# Patient Record
Sex: Female | Born: 1995 | Race: Black or African American | Hispanic: No | Marital: Married | Smoking: Never smoker
Health system: Southern US, Community
[De-identification: ages and names within clinical notes are randomized; demographics above are authoritative.]

## PROBLEM LIST (undated history)

## (undated) DIAGNOSIS — J45909 Unspecified asthma, uncomplicated: Secondary | ICD-10-CM

## (undated) DIAGNOSIS — E039 Hypothyroidism, unspecified: Secondary | ICD-10-CM

---

## 2019-09-30 ENCOUNTER — Encounter (HOSPITAL_BASED_OUTPATIENT_CLINIC_OR_DEPARTMENT_OTHER): Payer: Self-pay | Admitting: *Deleted

## 2019-09-30 ENCOUNTER — Other Ambulatory Visit: Payer: Self-pay

## 2019-09-30 ENCOUNTER — Emergency Department (HOSPITAL_BASED_OUTPATIENT_CLINIC_OR_DEPARTMENT_OTHER): Payer: BC Managed Care – PPO

## 2019-09-30 ENCOUNTER — Emergency Department (HOSPITAL_BASED_OUTPATIENT_CLINIC_OR_DEPARTMENT_OTHER)
Admission: EM | Admit: 2019-09-30 | Discharge: 2019-09-30 | Disposition: A | Discharge status: Home / Self Care | Payer: BC Managed Care – PPO | Attending: Emergency Medicine | Admitting: Emergency Medicine

## 2019-09-30 DIAGNOSIS — R000 Tachycardia, unspecified: Secondary | ICD-10-CM | POA: Insufficient documentation

## 2019-09-30 DIAGNOSIS — U071 COVID-19: Secondary | ICD-10-CM | POA: Diagnosis not present

## 2019-09-30 DIAGNOSIS — R5383 Other fatigue: Secondary | ICD-10-CM | POA: Diagnosis not present

## 2019-09-30 DIAGNOSIS — J1282 Pneumonia due to coronavirus disease 2019: Secondary | ICD-10-CM | POA: Insufficient documentation

## 2019-09-30 DIAGNOSIS — J3489 Other specified disorders of nose and nasal sinuses: Secondary | ICD-10-CM | POA: Insufficient documentation

## 2019-09-30 DIAGNOSIS — R05 Cough: Secondary | ICD-10-CM | POA: Diagnosis present

## 2019-09-30 LAB — CBC WITH DIFFERENTIAL/PLATELET
Abs Immature Granulocytes: 0.02 10*3/uL (ref 0.00–0.07)
Basophils Absolute: 0 10*3/uL (ref 0.0–0.1)
Basophils Relative: 0 %
Eosinophils Absolute: 0 10*3/uL (ref 0.0–0.5)
Eosinophils Relative: 0 %
HCT: 40.2 % (ref 36.0–46.0)
Hemoglobin: 13.5 g/dL (ref 12.0–15.0)
Immature Granulocytes: 0 %
Lymphocytes Relative: 15 %
Lymphs Abs: 0.9 10*3/uL (ref 0.7–4.0)
MCH: 28.4 pg (ref 26.0–34.0)
MCHC: 33.6 g/dL (ref 30.0–36.0)
MCV: 84.5 fL (ref 80.0–100.0)
Monocytes Absolute: 0.5 10*3/uL (ref 0.1–1.0)
Monocytes Relative: 8 %
Neutro Abs: 4.4 10*3/uL (ref 1.7–7.7)
Neutrophils Relative %: 77 %
Platelets: 202 10*3/uL (ref 150–400)
RBC: 4.76 MIL/uL (ref 3.87–5.11)
RDW: 14.4 % (ref 11.5–15.5)
WBC: 5.7 10*3/uL (ref 4.0–10.5)
nRBC: 0 % (ref 0.0–0.2)

## 2019-09-30 LAB — BASIC METABOLIC PANEL
Anion gap: 12 (ref 5–15)
BUN: 10 mg/dL (ref 6–20)
CO2: 24 mmol/L (ref 22–32)
Calcium: 8.3 mg/dL — ABNORMAL LOW (ref 8.9–10.3)
Chloride: 102 mmol/L (ref 98–111)
Creatinine, Ser: 1.28 mg/dL — ABNORMAL HIGH (ref 0.44–1.00)
GFR calc Af Amer: >60 mL/min (ref >60)
GFR calc non Af Amer: 59 mL/min — ABNORMAL LOW (ref >60)
Glucose, Bld: 99 mg/dL (ref 70–99)
Potassium: 3.2 mmol/L — ABNORMAL LOW (ref 3.5–5.1)
Sodium: 138 mmol/L (ref 135–145)

## 2019-09-30 LAB — SARS CORONAVIRUS 2 BY RT PCR (HOSPITAL ORDER, PERFORMED IN ~~LOC~~ HOSPITAL LAB): SARS Coronavirus 2: POSITIVE — AB

## 2019-09-30 LAB — HCG, QUANTITATIVE, PREGNANCY: hCG, Beta Chain, Quant, S: <1 m[IU]/mL (ref <5)

## 2019-09-30 LAB — TROPONIN I (HIGH SENSITIVITY): Troponin I (High Sensitivity): 4 ng/L (ref <18)

## 2019-09-30 LAB — PREGNANCY, URINE: Preg Test, Ur: NEGATIVE

## 2019-09-30 LAB — BRAIN NATRIURETIC PEPTIDE: B Natriuretic Peptide: 18.2 pg/mL (ref 0.0–100.0)

## 2019-09-30 MED ORDER — FLUTICASONE PROPIONATE 50 MCG/ACT NA SUSP: 2.0000 | Freq: Every day | NASAL | 0 refills | Status: DC

## 2019-09-30 MED ORDER — ALBUTEROL SULFATE HFA 108 (90 BASE) MCG/ACT IN AERS: 1.0000 | INHALATION_SPRAY | Freq: Four times a day (QID) | RESPIRATORY_TRACT | 0 refills | Status: DC | PRN

## 2019-09-30 MED ORDER — KETOROLAC TROMETHAMINE 30 MG/ML IJ SOLN
30.0000 mg | Freq: Once | INTRAMUSCULAR | Status: AC
  Administered 2019-09-30: 30 mg via INTRAVENOUS
  Filled 2019-09-30: qty 1

## 2019-09-30 MED ORDER — DEXAMETHASONE SODIUM PHOSPHATE 10 MG/ML IJ SOLN
10.0000 mg | Freq: Once | INTRAMUSCULAR | Status: AC
  Administered 2019-09-30: 10 mg via INTRAVENOUS
  Filled 2019-09-30: qty 1

## 2019-09-30 MED ORDER — IOHEXOL 350 MG/ML SOLN
100.0000 mL | Freq: Once | INTRAVENOUS | Status: AC | PRN
  Administered 2019-09-30: 100 mL via INTRAVENOUS

## 2019-09-30 MED ORDER — DEXAMETHASONE 6 MG PO TABS: 6.0000 mg | ORAL_TABLET | Freq: Every day | ORAL | 0 refills | Status: AC

## 2019-09-30 MED ORDER — SODIUM CHLORIDE 0.9 % IV BOLUS
500.0000 mL | Freq: Once | INTRAVENOUS | Status: AC
  Administered 2019-09-30: 500 mL via INTRAVENOUS

## 2019-09-30 MED ORDER — ACETAMINOPHEN 500 MG PO TABS
1000.0000 mg | ORAL_TABLET | Freq: Once | ORAL | Status: AC
  Administered 2019-09-30: 1000 mg via ORAL
  Filled 2019-09-30: qty 2

## 2019-09-30 MED ORDER — ONDANSETRON 4 MG PO TBDP: 4.0000 mg | ORAL_TABLET | Freq: Three times a day (TID) | ORAL | 0 refills | Status: DC | PRN

## 2019-09-30 MED ORDER — BENZONATATE 100 MG PO CAPS: 100.0000 mg | ORAL_CAPSULE | Freq: Three times a day (TID) | ORAL | 0 refills | Status: DC

## 2019-09-30 NOTE — Discharge Instructions (Addendum)
Take the tessalon pearls for cough  Albuterol for SOB or cough  Zofran for nausea or vomitting  Flonase for nasal congestion  Decadron for inflammation  Return for new or worsening symptoms such as CP, SOB, unilateral leg swelling, redness, warmth, coughing up blood, unable to keep down liquids  You need to self isolate at home for 10 days. I have written you a work/ school note. I would suggest people in your household also get tested for COVID and self isolate

## 2019-09-30 NOTE — ED Notes (Signed)
Commode placed in room.

## 2019-09-30 NOTE — ED Triage Notes (Signed)
Dry cough, sore throat, fever, chills, fatigue, headache, body aches x 3 days.

## 2019-09-30 NOTE — ED Provider Notes (Addendum)
[facility]PART[facility] Provider Note   CSN: [id number] Arrival date & time: 09/30/19  1647    History Chief Complaint  Patient presents with  . Cough    Sarah Calhoun is a 24 y.o. female with no significant past medical history who presents for evaluation of upper respiratory complaints.  Patient with nonproductive cough, sore throat, fever, fatigue, myalgias, congestion, rhinorrhea and headache x3 days.  She did not receive COVID vaccine.  Significant other with similar symptoms.  She has not been tested for Covid.  No sudden onset thunderclap headache.  No associated neck pain, neck stiffness.  She has been able to tolerate p.o. intake at home however has had decreased p.o. appetite.  She has not taken anything for her symptoms.  She denies any chest pain, shortness of breath, abdominal pain, diarrhea, dysuria, lateral leg swelling, redness or warmth.  Denies any chance of pregnancy.  Denies additional aggravating or relieving factors.  History obtained from patient and past medical records.  No interpreter used.  HPI     History reviewed. No pertinent past medical history.  There are no problems to display for this patient.   History reviewed. No pertinent surgical history.   OB History   No obstetric history on file.     No family history on file.  Social History   Tobacco Use  . Smoking status: Never Smoker  . Smokeless tobacco: Never Used  Substance Use Topics  . Alcohol use: Yes  . Drug use: Never    Home Medications Prior to Admission medications   Medication Sig Start Date End Date Taking? Authorizing Provider  albuterol (VENTOLIN HFA) 108 (90 Base) MCG/ACT inhaler Inhale 1-2 puffs into the lungs every 6 (six) hours as needed for wheezing or shortness of breath. 09/30/19   Khyren Hing A, PA-C  benzonatate (TESSALON) 100 MG capsule Take 1 capsule (100 mg total) by mouth every 8 (eight) hours. 09/30/19   Allyiah Gartner A, PA-C    dexamethasone (DECADRON) 6 MG tablet Take 1 tablet (6 mg total) by mouth daily for 5 days. 09/30/19 10/05/19  Loukisha Gunnerson A, PA-C  fluticasone (FLONASE) 50 MCG/ACT nasal spray Place 2 sprays into both nostrils daily. 09/30/19   Niyanna Asch A, PA-C  ondansetron (ZOFRAN ODT) 4 MG disintegrating tablet Take 1 tablet (4 mg total) by mouth every 8 (eight) hours as needed for nausea or vomiting. 09/30/19   Daquann Merriott A, PA-C    Allergies    Patient has no known allergies.  Review of Systems   Review of Systems  Constitutional: Positive for activity change, appetite change, chills, fatigue and fever.  HENT: Positive for congestion, postnasal drip, rhinorrhea and sore throat. Negative for drooling, ear discharge, sinus pressure, sinus pain, sneezing, trouble swallowing and voice change.   Respiratory: Positive for cough. Negative for apnea, choking, chest tightness, shortness of breath, wheezing and stridor.   Cardiovascular: Positive for chest pain (When coughing, no pain at rest).  Gastrointestinal: Negative.   Genitourinary: Negative.   Musculoskeletal: Negative.   Skin: Negative.   Neurological: Positive for weakness (Generalized) and headaches. Negative for dizziness, tremors, seizures, syncope, facial asymmetry, speech difficulty, light-headedness and numbness.  All other systems reviewed and are negative.   Physical Exam Updated Vital Signs BP 123/83 (BP Location: Left Arm)   Pulse 100   Temp 98.5 F (36.9 C) (Oral)   Resp 16   Ht 5\' 5"  (1.651 m)   Wt 133.8 kg   SpO2 93%  BMI 49.09 kg/m   Physical Exam Vitals and nursing note reviewed.  Constitutional:      General: She is not in acute distress.    Appearance: She is not ill-appearing, toxic-appearing or diaphoretic.  HENT:     Head: Normocephalic and atraumatic.     Jaw: There is normal jaw occlusion.     Right Ear: Tympanic membrane, ear canal and external ear normal. There is no impacted cerumen. No  hemotympanum. Tympanic membrane is not injected, scarred, perforated, erythematous, retracted or bulging.     Left Ear: Tympanic membrane, ear canal and external ear normal. There is no impacted cerumen. No hemotympanum. Tympanic membrane is not injected, scarred, perforated, erythematous, retracted or bulging.     Ears:     Comments: No Mastoid tenderness.    Nose:     Comments: Clear rhinorrhea and congestion to bilateral nares.  No sinus tenderness.    Mouth/Throat:     Comments: Posterior oropharynx clear.  Mucous membranes moist.  Tonsils without erythema or exudate.  Uvula midline without deviation.  No evidence of PTA or RPA.  No drooling, dysphasia or trismus.  Phonation normal. Neck:     Trachea: Trachea normal.     Meningeal: Brudzinski's sign and Kernig's sign absent.     Comments: No Neck stiffness or neck rigidity.  No meningismus.  No cervical lymphadenopathy. Hoarse voice Cardiovascular:     Rate and Rhythm: Tachycardia present.     Pulses: Normal pulses.          Radial pulses are 2+ on the right side and 2+ on the left side.       Dorsalis pedis pulses are 2+ on the right side and 2+ on the left side.     Heart sounds: Normal heart sounds.     Comments: No murmurs rubs or gallops. Pulmonary:     Effort: Pulmonary effort is normal. No tachypnea, accessory muscle usage or respiratory distress.     Breath sounds: Normal breath sounds.     Comments: Clear to auscultation bilaterally without wheeze, rhonchi or rales.  No accessory muscle usage.  Able speak in full sentences. Abdominal:     Comments: Soft, nontender without rebound or guarding.  No CVA tenderness.  Musculoskeletal:     Cervical back: Full passive range of motion without pain and normal range of motion.     Comments: Moves all 4 extremities without difficulty.  Lower extremities without edema, erythema or warmth.  Skin:    Comments: Brisk capillary refill.  No rashes or lesions.  Neurological:     Mental  Status: She is alert.     Comments: Ambulatory in department without difficulty.  Cranial nerves II through XII grossly intact.  No facial droop.  No aphasia.    ED Results / Procedures / Treatments   Labs (all labs ordered are listed, but only abnormal results are displayed) Labs Reviewed  SARS CORONAVIRUS 2 BY RT PCR ([facility]) - Abnormal; Notable for the following components:      Result Value   SARS Coronavirus 2 POSITIVE (*)    All other components within normal limits  BASIC METABOLIC PANEL - Abnormal; Notable for the following components:   Potassium 3.2 (*)    Creatinine, Ser 1.28 (*)    Calcium 8.3 (*)    GFR calc non Af Amer 59 (*)    All other components within normal limits  PREGNANCY, URINE  CBC WITH DIFFERENTIAL/PLATELET  BRAIN NATRIURETIC PEPTIDE  HCG, QUANTITATIVE, PREGNANCY  TROPONIN I (HIGH SENSITIVITY)  TROPONIN I (HIGH SENSITIVITY)    EKG EKG Interpretation  Date/Time:  Thursday September 30 2019 20:18:28 EDT Ventricular Rate:  102 PR Interval:    QRS Duration: 105 QT Interval:  356 QTC Calculation: 464 R Axis:   84 Text Interpretation: Sinus tachycardia Probable left atrial enlargement Nonspecific T abnormalities, anterior leads No STEMI Confirmed by Nanda Quinton [phone]) on 09/30/2019 9:11:13 PM   Radiology CT Angio Chest PE W and/or Wo Contrast  Result Date: 09/30/2019 CLINICAL DATA:  Dry cough, sore throat, fever, chills, COVID-19 positive EXAM: CT ANGIOGRAPHY CHEST WITH CONTRAST TECHNIQUE: Multidetector CT imaging of the chest was performed using the standard protocol during bolus administration of intravenous contrast. Multiplanar CT image reconstructions and MIPs were obtained to evaluate the vascular anatomy. CONTRAST:  180mL OMNIPAQUE IOHEXOL 350 MG/ML SOLN COMPARISON:  09/30/2019 FINDINGS: Cardiovascular: This is a technically adequate evaluation of the pulmonary vasculature. There are no filling defects  or pulmonary emboli. The heart is unremarkable with no significant pericardial effusion. Thoracic aorta is unremarkable. Mediastinum/Nodes: Thyroid, trachea, and esophagus are unremarkable. Small lymph nodes are seen in the bilateral hilar regions, likely reactive. No pathologic adenopathy. Lungs/Pleura: Nodular airspace disease is seen bilaterally throughout the lungs, most consistent with multifocal pneumonia. No effusion or pneumothorax. Central airways are patent. Upper Abdomen: No acute abnormality. Musculoskeletal: No acute or destructive bony lesions. Reconstructed images demonstrate no additional findings. Review of the MIP images confirms the above findings. IMPRESSION: 1. No evidence of pulmonary embolus. 2. Multifocal bilateral nodular airspace disease, compatible with COVID-19 pneumonia. Electronically Signed   By: Randa Ngo M.D.   On: 09/30/2019 21:05   DG Chest Portable 1 View  Result Date: 09/30/2019 CLINICAL DATA:  COVID.  Headache. EXAM: PORTABLE CHEST 1 VIEW COMPARISON:  None. FINDINGS: Normal cardiac silhouette. Central venous pulmonary congestion. No focal infiltrate. No pleural fluid. No pneumothorax. IMPRESSION: Central venous congestion.  No viral pneumonia identified. Electronically Signed   By: Suzy Bouchard M.D.   On: 09/30/2019 19:26    Procedures Procedures (including critical care time)  Medications Ordered in ED Medications  acetaminophen (TYLENOL) tablet 1,000 mg (1,000 mg Oral Given 09/30/19 1703)  sodium chloride 0.9 % bolus 500 mL (500 mLs Intravenous New Bag/Given 09/30/19 2008)  iohexol (OMNIPAQUE) 350 MG/ML injection 100 mL (100 mLs Intravenous Contrast Given 09/30/19 2054)  ketorolac (TORADOL) 30 MG/ML injection 30 mg (30 mg Intravenous Given 09/30/19 2124)  dexamethasone (DECADRON) injection 10 mg (10 mg Intravenous Given 09/30/19 2129)    ED Course  I have reviewed the triage vital signs and the nursing notes.  Pertinent labs & imaging results that  were available during my care of the patient were reviewed by me and considered in my medical decision making (see chart for details).  24 year old presents for evaluation of upper respiratory complaints.  On arrival she is febrile and tachycardic.  Triage placed Covid orders.  On my initial assessment patient has defervesced and heart rate has improved.  Her heart and lungs are clear her abdomen is soft, nontender.  She has a nonfocal neuro exam without deficits.  Her posterior oropharynx is clear.  She does have clear rhinorrhea and congestion to her bilateral nares.  She has a hoarse voice however has no evidence of PTA or RPA.  Plan on labs, imaging and reassess.  Labs and imaging personally reviewed and interpreted:  CBC without leukocytosis, Hgb 60.4 Metabolic panel with mild  hyponatremia to 3.2, creatinine 1.28, calcium 8.3, no prior labs to compare Pregnancy test negative Trop 4 BNP 18.2 COVID POSITIVE DG chest with pulmonary congestion EKG without STEMI CTA chest with multifocal viral pneumonia consistent with Covid infection. No edema or cardiomegally  Patient reassessed. Ambulatory without hypoxia.  Tolerating p.o. intake without difficulty.  Discussed lab and imaging findings.  Will DC home with symptomatic management.  I have discussed return precautions.  She is agreeable to this and will return for any worsening symptoms.  The patient has been appropriately medically screened and/or stabilized in the ED. I have low suspicion for any other emergent medical condition which would require further screening, evaluation or treatment in the ED or require inpatient management.  Patient is hemodynamically stable and in no acute distress.  Patient able to ambulate in department prior to ED.  Evaluation does not show acute pathology that would require ongoing or additional emergent interventions while in the emergency department or further inpatient treatment.  I have discussed the diagnosis  with the patient and answered all questions.  Pain is been managed while in the emergency department and patient has no further complaints prior to discharge.  Patient is comfortable with plan discussed in room and is stable for discharge at this time.  I have discussed strict return precautions for returning to the emergency department.  Patient was encouraged to follow-up with PCP/specialist refer to at discharge.    MDM Rules/Calculators/A&P                         Analyn Vetsch was evaluated in Emergency Department on 09/30/2019 for the symptoms described in the history of present illness. She was evaluated in the context of the global COVID-19 pandemic, which necessitated consideration that the patient might be at risk for infection with the SARS-CoV-2 virus that causes COVID-19. Institutional protocols and algorithms that pertain to the evaluation of patients at risk for COVID-19 are in a state of rapid change based on information released by regulatory bodies including the CDC and federal and state organizations. These policies and algorithms were followed during the patient's care in the ED. Final Clinical Impression(s) / ED Diagnoses Final diagnoses:  COVID-19  Pneumonia due to COVID-19 virus    Rx / DC Orders ED Discharge Orders         Ordered    benzonatate (TESSALON) 100 MG capsule  Every 8 hours        09/30/19 2204    fluticasone (FLONASE) 50 MCG/ACT nasal spray  Daily        09/30/19 2204    albuterol (VENTOLIN HFA) 108 (90 Base) MCG/ACT inhaler  Every 6 hours PRN        09/30/19 2204    dexamethasone (DECADRON) 6 MG tablet  Daily        09/30/19 2204    ondansetron (ZOFRAN ODT) 4 MG disintegrating tablet  Every 8 hours PRN        09/30/19 2206           Augustin Bun A, PA-C 09/30/19 2213    Marisol Glazer A, PA-C 09/30/19 2213    Margette Fast, MD 10/04/19 1119

## 2019-10-02 ENCOUNTER — Other Ambulatory Visit: Payer: Self-pay

## 2019-10-02 ENCOUNTER — Emergency Department (HOSPITAL_BASED_OUTPATIENT_CLINIC_OR_DEPARTMENT_OTHER): Payer: BC Managed Care – PPO

## 2019-10-02 ENCOUNTER — Encounter (HOSPITAL_BASED_OUTPATIENT_CLINIC_OR_DEPARTMENT_OTHER): Payer: Self-pay | Admitting: Emergency Medicine

## 2019-10-02 ENCOUNTER — Emergency Department (HOSPITAL_BASED_OUTPATIENT_CLINIC_OR_DEPARTMENT_OTHER)
Admission: EM | Admit: 2019-10-02 | Discharge: 2019-10-02 | Disposition: A | Discharge status: Home / Self Care | Payer: BC Managed Care – PPO | Source: Home / Self Care | Attending: Emergency Medicine | Admitting: Emergency Medicine

## 2019-10-02 DIAGNOSIS — Z79899 Other long term (current) drug therapy: Secondary | ICD-10-CM | POA: Insufficient documentation

## 2019-10-02 DIAGNOSIS — R000 Tachycardia, unspecified: Secondary | ICD-10-CM | POA: Insufficient documentation

## 2019-10-02 DIAGNOSIS — R079 Chest pain, unspecified: Secondary | ICD-10-CM

## 2019-10-02 DIAGNOSIS — U071 COVID-19: Secondary | ICD-10-CM | POA: Insufficient documentation

## 2019-10-02 DIAGNOSIS — A4189 Other specified sepsis: Secondary | ICD-10-CM | POA: Diagnosis not present

## 2019-10-02 LAB — CBC
HCT: 39.1 % (ref 36.0–46.0)
Hemoglobin: 13.3 g/dL (ref 12.0–15.0)
MCH: 28.4 pg (ref 26.0–34.0)
MCHC: 34 g/dL (ref 30.0–36.0)
MCV: 83.4 fL (ref 80.0–100.0)
Platelets: 218 10*3/uL (ref 150–400)
RBC: 4.69 MIL/uL (ref 3.87–5.11)
RDW: 14.3 % (ref 11.5–15.5)
WBC: 9.8 10*3/uL (ref 4.0–10.5)
nRBC: 0 % (ref 0.0–0.2)

## 2019-10-02 LAB — BASIC METABOLIC PANEL
Anion gap: 12 (ref 5–15)
BUN: 13 mg/dL (ref 6–20)
CO2: 24 mmol/L (ref 22–32)
Calcium: 8.3 mg/dL — ABNORMAL LOW (ref 8.9–10.3)
Chloride: 100 mmol/L (ref 98–111)
Creatinine, Ser: 1.11 mg/dL — ABNORMAL HIGH (ref 0.44–1.00)
GFR calc Af Amer: >60 mL/min (ref >60)
GFR calc non Af Amer: >60 mL/min (ref >60)
Glucose, Bld: 106 mg/dL — ABNORMAL HIGH (ref 70–99)
Potassium: 3.6 mmol/L (ref 3.5–5.1)
Sodium: 136 mmol/L (ref 135–145)

## 2019-10-02 MED ORDER — KETOROLAC TROMETHAMINE 15 MG/ML IJ SOLN
15.0000 mg | Freq: Once | INTRAMUSCULAR | Status: AC
  Administered 2019-10-02: 15 mg via INTRAVENOUS
  Filled 2019-10-02: qty 1

## 2019-10-02 MED ORDER — LACTATED RINGERS IV BOLUS
1000.0000 mL | Freq: Once | INTRAVENOUS | Status: AC
  Administered 2019-10-02: 1000 mL via INTRAVENOUS

## 2019-10-02 MED ORDER — OXYCODONE-ACETAMINOPHEN 5-325 MG PO TABS
1.0000 | ORAL_TABLET | Freq: Once | ORAL | Status: AC
  Administered 2019-10-02: 1 via ORAL
  Filled 2019-10-02: qty 1

## 2019-10-02 MED ORDER — OXYCODONE-ACETAMINOPHEN 5-325 MG PO TABS: 1.0000 | ORAL_TABLET | Freq: Four times a day (QID) | ORAL | 0 refills | Status: DC | PRN

## 2019-10-02 MED ORDER — ACETAMINOPHEN 325 MG PO TABS
650.0000 mg | ORAL_TABLET | Freq: Once | ORAL | Status: AC | PRN
  Administered 2019-10-02: 650 mg via ORAL
  Filled 2019-10-02: qty 2

## 2019-10-02 NOTE — ED Notes (Signed)
ED Provider at bedside. 

## 2019-10-02 NOTE — ED Notes (Signed)
MD aware of temp 

## 2019-10-02 NOTE — ED Provider Notes (Signed)
[facility] EMER[facility] DEPARTMENT Provider Note   CSN: [id number] Arrival date & time: 10/02/19  1529     History Chief Complaint  Patient presents with  . Shortness of Breath    COVID+    Sarah Calhoun is a 24 y.o. female.   Shortness of Breath Severity:  Moderate Onset quality:  Gradual Progression:  Worsening Chronicity:  New Context: URI (Covid)   Relieved by:  Nothing Worsened by:  Deep breathing (sitting down) Ineffective treatments:  Inhaler (steroids) Associated symptoms: chest pain, cough, fever, hemoptysis (mild) and sputum production   Associated symptoms: no abdominal pain, no headaches, no rash and no vomiting        History reviewed. No pertinent past medical history.  There are no problems to display for this patient.   History reviewed. No pertinent surgical history.   OB History   No obstetric history on file.     No family history on file.  Social History   Tobacco Use  . Smoking status: Never Smoker  . Smokeless tobacco: Never Used  Substance Use Topics  . Alcohol use: Yes  . Drug use: Never    Home Medications Prior to Admission medications   Medication Sig Start Date End Date Taking? Authorizing Provider  albuterol (VENTOLIN HFA) 108 (90 Base) MCG/ACT inhaler Inhale 1-2 puffs into the lungs every 6 (six) hours as needed for wheezing or shortness of breath. 09/30/19   Henderly, Britni A, PA-C  benzonatate (TESSALON) 100 MG capsule Take 1 capsule (100 mg total) by mouth every 8 (eight) hours. 09/30/19   Henderly, Britni A, PA-C  dexamethasone (DECADRON) 6 MG tablet Take 1 tablet (6 mg total) by mouth daily for 5 days. 09/30/19 10/05/19  Henderly, Britni A, PA-C  fluticasone (FLONASE) 50 MCG/ACT nasal spray Place 2 sprays into both nostrils daily. 09/30/19   Henderly, Britni A, PA-C  ondansetron (ZOFRAN ODT) 4 MG disintegrating tablet Take 1 tablet (4 mg total) by mouth every 8 (eight) hours as needed for nausea or vomiting. 09/30/19    Henderly, Britni A, PA-C  oxyCODONE-acetaminophen (PERCOCET/ROXICET) 5-325 MG tablet Take 1 tablet by mouth every 6 (six) hours as needed for severe pain. 10/02/19   Breck Coons, MD    Allergies    Patient has no known allergies.  Review of Systems   Review of Systems  Constitutional: Positive for activity change, appetite change and fever. Negative for chills.  HENT: Negative for congestion and rhinorrhea.   Respiratory: Positive for cough, hemoptysis (mild), sputum production, chest tightness and shortness of breath.   Cardiovascular: Positive for chest pain. Negative for palpitations.  Gastrointestinal: Negative for abdominal pain, diarrhea, nausea and vomiting.  Genitourinary: Negative for difficulty urinating and dysuria.  Musculoskeletal: Negative for arthralgias and back pain.  Skin: Negative for rash and wound.  Neurological: Negative for light-headedness and headaches.    Physical Exam Updated Vital Signs BP 133/85   Pulse 86   Temp (!) 102.3 F (39.1 C) (Oral)   Resp 18   SpO2 94%   Physical Exam Vitals and nursing note reviewed. Exam conducted with a chaperone present.  Constitutional:      General: She is not in acute distress.    Appearance: Normal appearance.  HENT:     Head: Normocephalic and atraumatic.     Nose: No rhinorrhea.  Eyes:     General:        Right eye: No discharge.        Left eye:  No discharge.     Conjunctiva/sclera: Conjunctivae normal.  Cardiovascular:     Rate and Rhythm: Regular rhythm. Tachycardia present.  Pulmonary:     Effort: Pulmonary effort is normal. Tachypnea present. No respiratory distress.     Breath sounds: No stridor. Decreased breath sounds present.  Chest:     Chest wall: Tenderness present. No mass, deformity or crepitus. There is no dullness to percussion.  Abdominal:     General: Abdomen is flat. There is no distension.     Palpations: Abdomen is soft.  Musculoskeletal:        General: No tenderness or signs  of injury.  Skin:    General: Skin is warm and dry.     Capillary Refill: Capillary refill takes less than 2 seconds.  Neurological:     General: No focal deficit present.     Mental Status: She is alert. Mental status is at baseline.     Motor: No weakness.  Psychiatric:        Mood and Affect: Mood normal.        Behavior: Behavior normal.     ED Results / Procedures / Treatments   Labs (all labs ordered are listed, but only abnormal results are displayed) Labs Reviewed  BASIC METABOLIC PANEL - Abnormal; Notable for the following components:      Result Value   Glucose, Bld 106 (*)    Creatinine, Ser 1.11 (*)    Calcium 8.3 (*)    All other components within normal limits  CBC    EKG EKG Interpretation  Date/Time:  Saturday October 02 2019 16:18:21 EDT Ventricular Rate:  99 PR Interval:    QRS Duration: 106 QT Interval:  361 QTC Calculation: 464 R Axis:   84 Text Interpretation: Sinus rhythm RSR' in V1 or V2, right VCD or RVH Confirmed by Dewaine Conger [phone]) on 10/02/2019 4:20:11 PM   Radiology CT Angio Chest PE W and/or Wo Contrast  Result Date: 09/30/2019 CLINICAL DATA:  Dry cough, sore throat, fever, chills, COVID-19 positive EXAM: CT ANGIOGRAPHY CHEST WITH CONTRAST TECHNIQUE: Multidetector CT imaging of the chest was performed using the standard protocol during bolus administration of intravenous contrast. Multiplanar CT image reconstructions and MIPs were obtained to evaluate the vascular anatomy. CONTRAST:  150mL OMNIPAQUE IOHEXOL 350 MG/ML SOLN COMPARISON:  09/30/2019 FINDINGS: Cardiovascular: This is a technically adequate evaluation of the pulmonary vasculature. There are no filling defects or pulmonary emboli. The heart is unremarkable with no significant pericardial effusion. Thoracic aorta is unremarkable. Mediastinum/Nodes: Thyroid, trachea, and esophagus are unremarkable. Small lymph nodes are seen in the bilateral hilar regions, likely reactive. No pathologic  adenopathy. Lungs/Pleura: Nodular airspace disease is seen bilaterally throughout the lungs, most consistent with multifocal pneumonia. No effusion or pneumothorax. Central airways are patent. Upper Abdomen: No acute abnormality. Musculoskeletal: No acute or destructive bony lesions. Reconstructed images demonstrate no additional findings. Review of the MIP images confirms the above findings. IMPRESSION: 1. No evidence of pulmonary embolus. 2. Multifocal bilateral nodular airspace disease, compatible with COVID-19 pneumonia. Electronically Signed   By: Randa Ngo M.D.   On: 09/30/2019 21:05   DG Chest Portable 1 View  Result Date: 10/02/2019 CLINICAL DATA:  Worsening shortness of breath, COVID-19 positive EXAM: PORTABLE CHEST 1 VIEW COMPARISON:  09/30/2019 FINDINGS: Single frontal view of the chest demonstrates a stable cardiac silhouette. Lung volumes are diminished. The bilateral ground-glass airspace disease seen on prior chest x-ray is not significantly changed on this exam. No effusion  or pneumothorax. No acute bony abnormality. IMPRESSION: 1. Stable multifocal pneumonia.  Pattern consistent with COVID-19. Electronically Signed   By: Randa Ngo M.D.   On: 10/02/2019 16:56   DG Chest Portable 1 View  Result Date: 09/30/2019 CLINICAL DATA:  COVID.  Headache. EXAM: PORTABLE CHEST 1 VIEW COMPARISON:  None. FINDINGS: Normal cardiac silhouette. Central venous pulmonary congestion. No focal infiltrate. No pleural fluid. No pneumothorax. IMPRESSION: Central venous congestion.  No viral pneumonia identified. Electronically Signed   By: Suzy Bouchard M.D.   On: 09/30/2019 19:26    Procedures Procedures (including critical care time)  Medications Ordered in ED Medications  acetaminophen (TYLENOL) tablet 650 mg (650 mg Oral Given 10/02/19 1541)  lactated ringers bolus 1,000 mL (0 mLs Intravenous Stopped 10/02/19 1805)  ketorolac (TORADOL) 15 MG/ML injection 15 mg (15 mg Intravenous Given 10/02/19  1625)  oxyCODONE-acetaminophen (PERCOCET/ROXICET) 5-325 MG per tablet 1 tablet (1 tablet Oral Given 10/02/19 1715)    ED Course  I have reviewed the triage vital signs and the nursing notes.  Pertinent labs & imaging results that were available during my care of the patient were reviewed by me and considered in my medical decision making (see chart for details).    MDM Rules/Calculators/A&P                          24 year old female known to be Covid positive, morbidly obese, comes in today with worsening shortness of breath fevers tachycardia tachypnea.  No focal lung sounds with decreased air movement throughout.  Pain with breathing.  Chest x-ray will be done basic labs, EKG shows sinus rhythm without acute ischemic change interval abnormality or arrhythmia and compared to previous is unchanged.  She had troponin and a CTA at last visit I do not feel that we need to repeat those as her clinical picture seems identical though slightly worsened.  She is tried bronchodilators and steroids these have not helped.  She is not hypoxic, we will attempt pain control reevaluate chest x-ray and then see if the patient can ambulate without significant dyspnea.  CBC BMP are unremarkable.  Chest x-ray shows no acute change after radiology myself reviewed.  With the work-up she had a few days ago, we focused on pain control in the setting of painful cough.  Toradol helps a little.  Percocet helps significantly.  She is able to ambulate and keep her O2 sats up.  Her respiratory rate is much improved her heart rate is much improved she is still intermittently febrile will likely still have symptoms for several more days.  She is invited to come back anytime for worsening symptoms. Final Clinical Impression(s) / ED Diagnoses Final diagnoses:  Chest pain, unspecified type  COVID-19    Rx / DC Orders ED Discharge Orders         Ordered    oxyCODONE-acetaminophen (PERCOCET/ROXICET) 5-325 MG tablet  Every 6  hours PRN        10/02/19 1858           Breck Coons, MD 10/02/19 1901

## 2019-10-02 NOTE — ED Triage Notes (Signed)
Dx with COVID 2 days ago. C/o ongoing SOB

## 2019-10-04 ENCOUNTER — Inpatient Hospital Stay (HOSPITAL_COMMUNITY)
Admission: EM | Admit: 2019-10-04 | Discharge: 2019-10-15 | DRG: 871 | Disposition: A | Discharge status: Home / Self Care | Payer: BC Managed Care – PPO | Attending: Internal Medicine | Admitting: Internal Medicine

## 2019-10-04 ENCOUNTER — Encounter (HOSPITAL_COMMUNITY): Payer: Self-pay

## 2019-10-04 ENCOUNTER — Emergency Department (HOSPITAL_COMMUNITY): Payer: BC Managed Care – PPO

## 2019-10-04 ENCOUNTER — Other Ambulatory Visit: Payer: Self-pay

## 2019-10-04 DIAGNOSIS — D751 Secondary polycythemia: Secondary | ICD-10-CM | POA: Diagnosis present

## 2019-10-04 DIAGNOSIS — R739 Hyperglycemia, unspecified: Secondary | ICD-10-CM

## 2019-10-04 DIAGNOSIS — E66813 Obesity, class 3: Secondary | ICD-10-CM | POA: Diagnosis present

## 2019-10-04 DIAGNOSIS — J1282 Pneumonia due to coronavirus disease 2019: Secondary | ICD-10-CM | POA: Diagnosis present

## 2019-10-04 DIAGNOSIS — E1165 Type 2 diabetes mellitus with hyperglycemia: Secondary | ICD-10-CM | POA: Diagnosis present

## 2019-10-04 DIAGNOSIS — U071 COVID-19: Secondary | ICD-10-CM | POA: Diagnosis not present

## 2019-10-04 DIAGNOSIS — J9601 Acute respiratory failure with hypoxia: Secondary | ICD-10-CM

## 2019-10-04 DIAGNOSIS — K219 Gastro-esophageal reflux disease without esophagitis: Secondary | ICD-10-CM | POA: Diagnosis present

## 2019-10-04 DIAGNOSIS — E039 Hypothyroidism, unspecified: Secondary | ICD-10-CM | POA: Diagnosis not present

## 2019-10-04 DIAGNOSIS — R0600 Dyspnea, unspecified: Secondary | ICD-10-CM

## 2019-10-04 DIAGNOSIS — R1310 Dysphagia, unspecified: Secondary | ICD-10-CM | POA: Diagnosis present

## 2019-10-04 DIAGNOSIS — J45909 Unspecified asthma, uncomplicated: Secondary | ICD-10-CM | POA: Insufficient documentation

## 2019-10-04 DIAGNOSIS — K5900 Constipation, unspecified: Secondary | ICD-10-CM | POA: Diagnosis present

## 2019-10-04 DIAGNOSIS — J9621 Acute and chronic respiratory failure with hypoxia: Secondary | ICD-10-CM | POA: Insufficient documentation

## 2019-10-04 DIAGNOSIS — Z79899 Other long term (current) drug therapy: Secondary | ICD-10-CM | POA: Diagnosis not present

## 2019-10-04 DIAGNOSIS — J982 Interstitial emphysema: Secondary | ICD-10-CM

## 2019-10-04 DIAGNOSIS — A4189 Other specified sepsis: Secondary | ICD-10-CM | POA: Diagnosis present

## 2019-10-04 DIAGNOSIS — D473 Essential (hemorrhagic) thrombocythemia: Secondary | ICD-10-CM | POA: Diagnosis present

## 2019-10-04 DIAGNOSIS — A419 Sepsis, unspecified organism: Secondary | ICD-10-CM | POA: Diagnosis present

## 2019-10-04 DIAGNOSIS — R0602 Shortness of breath: Secondary | ICD-10-CM

## 2019-10-04 DIAGNOSIS — Z6841 Body Mass Index (BMI) 40.0 and over, adult: Secondary | ICD-10-CM | POA: Diagnosis not present

## 2019-10-04 DIAGNOSIS — T380X5A Adverse effect of glucocorticoids and synthetic analogues, initial encounter: Secondary | ICD-10-CM | POA: Diagnosis present

## 2019-10-04 DIAGNOSIS — D72828 Other elevated white blood cell count: Secondary | ICD-10-CM | POA: Diagnosis present

## 2019-10-04 DIAGNOSIS — J939 Pneumothorax, unspecified: Secondary | ICD-10-CM | POA: Diagnosis not present

## 2019-10-04 DIAGNOSIS — M542 Cervicalgia: Secondary | ICD-10-CM

## 2019-10-04 DIAGNOSIS — J9312 Secondary spontaneous pneumothorax: Secondary | ICD-10-CM | POA: Diagnosis not present

## 2019-10-04 DIAGNOSIS — R6520 Severe sepsis without septic shock: Secondary | ICD-10-CM | POA: Diagnosis present

## 2019-10-04 HISTORY — DX: Unspecified asthma, uncomplicated: J45.909

## 2019-10-04 HISTORY — DX: Hypothyroidism, unspecified: E03.9

## 2019-10-04 LAB — COMPREHENSIVE METABOLIC PANEL
ALT: 28 U/L (ref 0–44)
AST: 57 U/L — ABNORMAL HIGH (ref 15–41)
Albumin: 3.6 g/dL (ref 3.5–5.0)
Alkaline Phosphatase: 39 U/L (ref 38–126)
Anion gap: 12 (ref 5–15)
BUN: 8 mg/dL (ref 6–20)
CO2: 26 mmol/L (ref 22–32)
Calcium: 8.3 mg/dL — ABNORMAL LOW (ref 8.9–10.3)
Chloride: 102 mmol/L (ref 98–111)
Creatinine, Ser: 1.08 mg/dL — ABNORMAL HIGH (ref 0.44–1.00)
GFR calc Af Amer: >60 mL/min (ref >60)
GFR calc non Af Amer: >60 mL/min (ref >60)
Glucose, Bld: 113 mg/dL — ABNORMAL HIGH (ref 70–99)
Potassium: 3.7 mmol/L (ref 3.5–5.1)
Sodium: 140 mmol/L (ref 135–145)
Total Bilirubin: 0.9 mg/dL (ref 0.3–1.2)
Total Protein: 7.2 g/dL (ref 6.5–8.1)

## 2019-10-04 LAB — CBC WITH DIFFERENTIAL/PLATELET
Abs Immature Granulocytes: 0.04 10*3/uL (ref 0.00–0.07)
Basophils Absolute: 0 10*3/uL (ref 0.0–0.1)
Basophils Relative: 1 %
Eosinophils Absolute: 0 10*3/uL (ref 0.0–0.5)
Eosinophils Relative: 0 %
HCT: 42.7 % (ref 36.0–46.0)
Hemoglobin: 14 g/dL (ref 12.0–15.0)
Immature Granulocytes: 1 %
Lymphocytes Relative: 14 %
Lymphs Abs: 0.6 10*3/uL — ABNORMAL LOW (ref 0.7–4.0)
MCH: 28.8 pg (ref 26.0–34.0)
MCHC: 32.8 g/dL (ref 30.0–36.0)
MCV: 87.9 fL (ref 80.0–100.0)
Monocytes Absolute: 0.3 10*3/uL (ref 0.1–1.0)
Monocytes Relative: 8 %
Neutro Abs: 3.4 10*3/uL (ref 1.7–7.7)
Neutrophils Relative %: 76 %
Platelets: 179 10*3/uL (ref 150–400)
RBC: 4.86 MIL/uL (ref 3.87–5.11)
RDW: 14.7 % (ref 11.5–15.5)
WBC: 4.4 10*3/uL (ref 4.0–10.5)
nRBC: 0 % (ref 0.0–0.2)

## 2019-10-04 LAB — CREATININE, SERUM
Creatinine, Ser: 1.04 mg/dL — ABNORMAL HIGH (ref 0.44–1.00)
GFR calc Af Amer: >60 mL/min (ref >60)
GFR calc non Af Amer: >60 mL/min (ref >60)

## 2019-10-04 LAB — LACTATE DEHYDROGENASE: LDH: 540 U/L — ABNORMAL HIGH (ref 98–192)

## 2019-10-04 LAB — TSH: TSH: 8.396 u[IU]/mL — ABNORMAL HIGH (ref 0.350–4.500)

## 2019-10-04 LAB — TRIGLYCERIDES: Triglycerides: 169 mg/dL — ABNORMAL HIGH (ref <150)

## 2019-10-04 LAB — D-DIMER, QUANTITATIVE: D-Dimer, Quant: 2.41 ug/mL-FEU — ABNORMAL HIGH (ref 0.00–0.50)

## 2019-10-04 LAB — HIV ANTIBODY (ROUTINE TESTING W REFLEX): HIV Screen 4th Generation wRfx: NONREACTIVE

## 2019-10-04 LAB — BRAIN NATRIURETIC PEPTIDE: B Natriuretic Peptide: 46.6 pg/mL (ref 0.0–100.0)

## 2019-10-04 LAB — TROPONIN I (HIGH SENSITIVITY): Troponin I (High Sensitivity): 4 ng/L (ref <18)

## 2019-10-04 LAB — PROCALCITONIN
Procalcitonin: 0.12 ng/mL
Procalcitonin: 0.1 ng/mL

## 2019-10-04 LAB — FERRITIN: Ferritin: 346 ng/mL — ABNORMAL HIGH (ref 11–307)

## 2019-10-04 LAB — I-STAT BETA HCG BLOOD, ED (MC, WL, AP ONLY): I-stat hCG, quantitative: <5 m[IU]/mL (ref <5)

## 2019-10-04 LAB — HEPATITIS B SURFACE ANTIGEN: Hepatitis B Surface Ag: NONREACTIVE

## 2019-10-04 LAB — FIBRINOGEN: Fibrinogen: 649 mg/dL — ABNORMAL HIGH (ref 210–475)

## 2019-10-04 LAB — C-REACTIVE PROTEIN: CRP: 9.2 mg/dL — ABNORMAL HIGH (ref <1.0)

## 2019-10-04 LAB — LACTIC ACID, PLASMA: Lactic Acid, Venous: 1 mmol/L (ref 0.5–1.9)

## 2019-10-04 MED ORDER — ENOXAPARIN SODIUM 80 MG/0.8ML ~~LOC~~ SOLN
65.0000 mg | SUBCUTANEOUS | Status: DC
  Administered 2019-10-04 – 2019-10-15 (×12): 65 mg via SUBCUTANEOUS
  Filled 2019-10-04 (×3): qty 0.8
  Filled 2019-10-04: qty 0.65
  Filled 2019-10-04 (×2): qty 0.8
  Filled 2019-10-04: qty 0.65
  Filled 2019-10-04 (×2): qty 0.8
  Filled 2019-10-04: qty 0.65
  Filled 2019-10-04 (×3): qty 0.8

## 2019-10-04 MED ORDER — ALBUTEROL SULFATE HFA 108 (90 BASE) MCG/ACT IN AERS
2.0000 | INHALATION_SPRAY | Freq: Four times a day (QID) | RESPIRATORY_TRACT | Status: DC
  Administered 2019-10-04 – 2019-10-06 (×6): 2 via RESPIRATORY_TRACT
  Filled 2019-10-04: qty 6.7

## 2019-10-04 MED ORDER — ACETAMINOPHEN 325 MG PO TABS
650.0000 mg | ORAL_TABLET | Freq: Four times a day (QID) | ORAL | Status: DC | PRN
  Administered 2019-10-07 – 2019-10-12 (×7): 650 mg via ORAL
  Filled 2019-10-04 (×7): qty 2

## 2019-10-04 MED ORDER — SODIUM CHLORIDE 0.9% FLUSH
10.0000 mL | Freq: Two times a day (BID) | INTRAVENOUS | Status: DC
  Administered 2019-10-04 – 2019-10-15 (×21): 10 mL

## 2019-10-04 MED ORDER — HYDROCOD POLST-CPM POLST ER 10-8 MG/5ML PO SUER
5.0000 mL | Freq: Two times a day (BID) | ORAL | Status: DC | PRN
  Administered 2019-10-05 – 2019-10-13 (×7): 5 mL via ORAL
  Filled 2019-10-04 (×8): qty 5

## 2019-10-04 MED ORDER — ONDANSETRON HCL 4 MG/2ML IJ SOLN
4.0000 mg | Freq: Four times a day (QID) | INTRAMUSCULAR | Status: DC | PRN
  Administered 2019-10-05: 4 mg via INTRAVENOUS
  Filled 2019-10-04: qty 2

## 2019-10-04 MED ORDER — ASCORBIC ACID 500 MG PO TABS
500.0000 mg | ORAL_TABLET | Freq: Every day | ORAL | Status: DC
  Administered 2019-10-04 – 2019-10-15 (×12): 500 mg via ORAL
  Filled 2019-10-04 (×11): qty 1

## 2019-10-04 MED ORDER — SODIUM CHLORIDE 0.9 % IV SOLN
200.0000 mg | Freq: Once | INTRAVENOUS | Status: AC
  Administered 2019-10-04: 200 mg via INTRAVENOUS
  Filled 2019-10-04: qty 40

## 2019-10-04 MED ORDER — GUAIFENESIN-DM 100-10 MG/5ML PO SYRP
10.0000 mL | ORAL_SOLUTION | ORAL | Status: DC | PRN
  Administered 2019-10-09 – 2019-10-13 (×4): 10 mL via ORAL
  Filled 2019-10-04 (×4): qty 10

## 2019-10-04 MED ORDER — SODIUM CHLORIDE 0.9% FLUSH: 10.0000 mL | INTRAVENOUS | Status: DC | PRN

## 2019-10-04 MED ORDER — METHYLPREDNISOLONE SODIUM SUCC 125 MG IJ SOLR
0.5000 mg/kg | Freq: Two times a day (BID) | INTRAMUSCULAR | Status: DC
  Administered 2019-10-04 – 2019-10-05 (×2): 66.875 mg via INTRAVENOUS
  Filled 2019-10-04 (×2): qty 2

## 2019-10-04 MED ORDER — ZINC SULFATE 220 (50 ZN) MG PO CAPS
220.0000 mg | ORAL_CAPSULE | Freq: Every day | ORAL | Status: DC
  Administered 2019-10-04 – 2019-10-15 (×12): 220 mg via ORAL
  Filled 2019-10-04 (×10): qty 1

## 2019-10-04 MED ORDER — SODIUM CHLORIDE 0.9 % IV SOLN
100.0000 mg | Freq: Every day | INTRAVENOUS | Status: AC
  Administered 2019-10-05 – 2019-10-08 (×4): 100 mg via INTRAVENOUS
  Filled 2019-10-04 (×5): qty 20

## 2019-10-04 MED ORDER — IBUPROFEN 400 MG PO TABS
600.0000 mg | ORAL_TABLET | Freq: Once | ORAL | Status: AC
  Administered 2019-10-04: 600 mg via ORAL
  Filled 2019-10-04: qty 1

## 2019-10-04 MED ORDER — ONDANSETRON HCL 4 MG PO TABS
4.0000 mg | ORAL_TABLET | Freq: Four times a day (QID) | ORAL | Status: DC | PRN
  Administered 2019-10-05: 4 mg via ORAL
  Filled 2019-10-04: qty 1

## 2019-10-04 MED ORDER — LEVOTHYROXINE SODIUM 100 MCG PO TABS
200.0000 ug | ORAL_TABLET | Freq: Every day | ORAL | Status: DC
  Administered 2019-10-05 – 2019-10-15 (×11): 200 ug via ORAL
  Filled 2019-10-04 (×11): qty 2

## 2019-10-04 NOTE — Progress Notes (Signed)
Encouraged patient to self prone. And educated patient on the benefits of proning. Patient states will attempt. Patient is now currently on [place] at 30L 90%. Will titrate as tolerated. Goal of care for the patient is to establish and maintain good ventilation and optimize oxygenation via utilization of HHFNC to meet systemic and myocardial oxygen demands.

## 2019-10-04 NOTE — H&P (Signed)
History and Physical    Sarah Calhoun MGQ:[id number] DOB: Dec 18, 1995 DOA: 10/04/2019  PCP: Loraine Leriche., MD  Patient coming from: home  I have personally briefly reviewed patient's old medical records in [place]  Chief Complaint: Shortness of breath, COVID-19 positive  HPI: Sarah Calhoun is a 24 y.o. female with medical history significant of hypothyroidism, morbid obesity with BMI of 49 presents to emergency department with worsening shortness of breath, body ache, fever, chills, generalized weakness and lethargy since 1 week.  Patient tells me that she tested positive for COVID-19 on 8/19.  She presented to ER on 8/19 CT angio of chest came back negative for PE.  She presented again on 8/21 with ongoing symptoms including cough, shortness of breath, generalized weakness, body ache and fever.  Work-up was reassuring including oxygen saturation was within normal limits on room air therefore she discharged home in stable condition.  Patient tells me that she could not catch her breath this morning therefore she came to the ER for further evaluation and management.  Reports fever, chills, body ache, weakness, decreased appetite, chest pain with deep breath, headache.  Denies blurry vision, lightheadedness, dizziness, wheezing, palpitation, leg swelling, abdominal pain, nausea, vomiting, diarrhea, urinary or bowel changes.  Reports that her father tested positive for COVID-19 and she is not vaccinated against COVID-19.  No history of smoking, illicit drug use however drinks alcohol occasionally.  ED Course: Upon arrival to ED: Patient had fever of 101, tachycardic, tachypneic, hypoxic requiring 4 to 6 L of oxygen via nasal cannula, CBC, lactic acid, CMP: WNL, D-dimer: 2.41, CRP: 9.2, chest x-ray shows multifocal pneumonia.  [facility] hospitalist consulted for admission for acute hypoxemic respiratory failure secondary to COVID-19 pneumonia.  Review of Systems: As per HPI otherwise  negative.    Past Medical History:  Diagnosis Date  . Asthma   . Hypothyroidism     History reviewed. No pertinent surgical history.   reports that she has never smoked. She has never used smokeless tobacco. She reports current alcohol use. She reports that she does not use drugs.  No Known Allergies  Family History  Problem Relation Age of Onset  . Hypertension Mother   . Hypertension Father     Prior to Admission medications   Medication Sig Start Date End Date Taking? Authorizing Provider  albuterol (VENTOLIN HFA) 108 (90 Base) MCG/ACT inhaler Inhale 1-2 puffs into the lungs every 6 (six) hours as needed for wheezing or shortness of breath. 09/30/19   Henderly, Britni A, PA-C  benzonatate (TESSALON) 100 MG capsule Take 1 capsule (100 mg total) by mouth every 8 (eight) hours. 09/30/19   Henderly, Britni A, PA-C  dexamethasone (DECADRON) 6 MG tablet Take 1 tablet (6 mg total) by mouth daily for 5 days. 09/30/19 10/05/19  Henderly, Britni A, PA-C  fluticasone (FLONASE) 50 MCG/ACT nasal spray Place 2 sprays into both nostrils daily. 09/30/19   Henderly, Britni A, PA-C  ondansetron (ZOFRAN ODT) 4 MG disintegrating tablet Take 1 tablet (4 mg total) by mouth every 8 (eight) hours as needed for nausea or vomiting. 09/30/19   Henderly, Britni A, PA-C  oxyCODONE-acetaminophen (PERCOCET/ROXICET) 5-325 MG tablet Take 1 tablet by mouth every 6 (six) hours as needed for severe pain. 10/02/19   Breck Coons, MD    Physical Exam: Vitals:   10/04/19 1029 10/04/19 1145  BP: (!) 131/93 110/82  Pulse: (!) 113 (!) 108  Resp: (!) 24 (!) 24  Temp: (!) 101 F (38.3  C)   TempSrc: Oral   SpO2: (!) 74% 92%  Weight: 133.8 kg   Height: 5\' 5"  (1.651 m)     Constitutional: NAD, calm, comfortable, on 6 L of oxygen via nasal cannula, obese  eyes: PERRL, lids and conjunctivae normal ENMT: Mucous membranes are moist. Posterior pharynx clear of any exudate or lesions.Normal dentition.  Neck: normal, supple,  no masses, no thyromegaly Respiratory: Tachypneic.  No wheezing or rhonchi noted Cardiovascular: Tachycardic, no murmurs / rubs / gallops. No extremity edema. 2+ pedal pulses. No carotid bruits.  Abdomen: no tenderness, no masses palpated. No hepatosplenomegaly. Bowel sounds positive.  Musculoskeletal: no clubbing / cyanosis. No joint deformity upper and lower extremities. Good ROM, no contractures. Normal muscle tone.  Skin: no rashes, lesions, ulcers. No induration Neurologic: CN 2-12 grossly intact. Sensation intact, DTR normal. Strength 5/5 in all 4.  Psychiatric: Normal judgment and insight. Alert and oriented x 3. Normal mood.    Labs on Admission: I have personally reviewed following labs and imaging studies  CBC: Recent Labs  Lab 09/30/19 2009 10/02/19 1626 10/04/19 1201  WBC 5.7 9.8 4.4  NEUTROABS 4.4  --  3.4  HGB 13.5 13.3 14.0  HCT 40.2 39.1 42.7  MCV 84.5 83.4 87.9  PLT 202 218 825   Basic Metabolic Panel: Recent Labs  Lab 09/30/19 2009 10/02/19 1626 10/04/19 1201  NA 138 136 140  K 3.2* 3.6 3.7  CL 102 100 102  CO2 24 24 26   GLUCOSE 99 106* 113*  BUN 10 13 8   CREATININE 1.28* 1.11* 1.08*  CALCIUM 8.3* 8.3* 8.3*   GFR: Estimated Creatinine Clearance: 112.2 mL/min (A) (by C-G formula based on SCr of 1.08 mg/dL (H)). Liver Function Tests: Recent Labs  Lab 10/04/19 1201  AST 57*  ALT 28  ALKPHOS 39  BILITOT 0.9  PROT 7.2  ALBUMIN 3.6   No results for input(s): LIPASE, AMYLASE in the last 168 hours. No results for input(s): AMMONIA in the last 168 hours. Coagulation Profile: No results for input(s): INR, PROTIME in the last 168 hours. Cardiac Enzymes: No results for input(s): CKTOTAL, CKMB, CKMBINDEX, TROPONINI in the last 168 hours. BNP (last 3 results) No results for input(s): PROBNP in the last 8760 hours. HbA1C: No results for input(s): HGBA1C in the last 72 hours. CBG: No results for input(s): GLUCAP in the last 168 hours. Lipid  Profile: Recent Labs    10/04/19 1201  TRIG 169*   Thyroid Function Tests: No results for input(s): TSH, T4TOTAL, FREET4, T3FREE, THYROIDAB in the last 72 hours. Anemia Panel: Recent Labs    10/04/19 1201  FERRITIN 346*   Urine analysis: No results found for: COLORURINE, APPEARANCEUR, LABSPEC, PHURINE, GLUCOSEU, HGBUR, BILIRUBINUR, KETONESUR, PROTEINUR, UROBILINOGEN, NITRITE, LEUKOCYTESUR  Radiological Exams on Admission: DG Chest Port 1 View  Result Date: 10/04/2019 CLINICAL DATA:  Shortness of breath. EXAM: PORTABLE CHEST 1 VIEW COMPARISON:  Same day. FINDINGS: The heart size and mediastinal contours are within normal limits. No pneumothorax or pleural effusion is noted. Continued presence of multiple opacity seen throughout both lungs concerning for multifocal pneumonia. The visualized skeletal structures are unremarkable. IMPRESSION: Continued presence of multiple bilateral lung opacities concerning for multifocal pneumonia. Electronically Signed   By: Marijo Conception M.D.   On: 10/04/2019 11:24   DG Chest Portable 1 View  Result Date: 10/02/2019 CLINICAL DATA:  Worsening shortness of breath, COVID-19 positive EXAM: PORTABLE CHEST 1 VIEW COMPARISON:  09/30/2019 FINDINGS: Single frontal view of the chest  demonstrates a stable cardiac silhouette. Lung volumes are diminished. The bilateral ground-glass airspace disease seen on prior chest x-ray is not significantly changed on this exam. No effusion or pneumothorax. No acute bony abnormality. IMPRESSION: 1. Stable multifocal pneumonia.  Pattern consistent with COVID-19. Electronically Signed   By: Randa Ngo M.D.   On: 10/02/2019 16:56    EKG: Independently reviewed.  Sinus tachycardia, probable left atrial enlargement.  No ST elevation or depression noted.  Assessment/Plan Principal Problem:   Acute hypoxemic respiratory failure due to COVID-19 [facility]) Active Problems:   Sepsis ([place])   Hypothyroidism    Sepsis/acute hypoxemic  respiratory failure due to COVID-19 pneumonia: -Patient has fever of 101, tachycardic, tachypneic.  Chest x-ray shows multifocal pneumonia.  No leukocytosis, lactic acid: WNL. -Patient had negative CTA of the chest on 8/19. -Admit patient to stepdown unit for close monitoring.  On continuous pulse ox.  We will try to wean off of oxygen as tolerated. -Start patient on remdesivir and Solu-Medrol.  Repeat inflammatory markers tomorrow a.m.  Procalcitonin level, blood culture: Pending.  Albuterol every 6 hour, p.o. vitamins and antitussive. -Patient was told that if COVID-19 pneumonitis gets worse we might potentially use Actemra off label, she denies known history of tuberculosis or hepatitis and wants to proceed with Actemra if required. -Monitor vitals closely.  Hypothyroidism: Check TSH -Continue levothyroxine 200 mcg-daily.  Morbid obesity with BMI of 49: -Dietary modification, exercise, weight loss recommended.  DVT prophylaxis: Lovenox/SCD Code Status: Full code Family Communication: None present at bedside.  Plan of care discussed with patient in length and he verbalized understanding and agreed with it. Disposition Plan: Home in 3 to 4 days Consults called: None Admission status: Inpatient   Mckinley Jewel MD [facility]  If 7PM-7AM, please contact night-coverage [email or web address] Password [email or web address]  10/04/2019, 2:13 PM

## 2019-10-04 NOTE — ED Notes (Signed)
RN to room pt noted to be desating down to 80 , RN placed pt on NRB at 15L,RN called RT to assess pt , pt is now sating 93 percent on NRB, RN will continue to monitor. RN will page MD to inform

## 2019-10-04 NOTE — ED Provider Notes (Signed)
[facility] EMERGENCY DEPARTMENT Provider Note   CSN: [id number] Arrival date & time: 10/04/19  1013     History No chief complaint on file.   Sarah Calhoun is a 24 y.o. female.  24 year old female with history of asthma and obesity who presents with COVID-19.  Patient began getting sick approximately 7 days ago and tested positive for COVID-19 on 8/19.  She returned to the ED on 8/21 with ongoing symptoms including cough, shortness of breath, body aches, fevers, and chest pain.  Work-up was reassuring and oxygen level was normal so she was discharged home.  She reports that her symptoms have worsened and she feels like she cannot catch her breath.  She reports generalized back pain.  She took Tylenol this morning, last dose of Motrin was several days ago. She has been using albuterol and tessalon.   The history is provided by the patient.       Past Medical History:  Diagnosis Date  . Asthma   . Hypothyroidism     Patient Active Problem List   Diagnosis Date Noted  . Sepsis ([place]) 10/04/2019  . Asthma   . Obesity, Class III, BMI 40-49.9 (morbid obesity) ([place])   . Acute hypoxemic respiratory failure due to COVID-19 [facility])     History reviewed. No pertinent surgical history.   OB History   No obstetric history on file.     Family History  Problem Relation Age of Onset  . Hypertension Mother   . Hypertension Father     Social History   Tobacco Use  . Smoking status: Never Smoker  . Smokeless tobacco: Never Used  Substance Use Topics  . Alcohol use: Yes  . Drug use: Never    Home Medications Prior to Admission medications   Medication Sig Start Date End Date Taking? Authorizing Provider  albuterol (VENTOLIN HFA) 108 (90 Base) MCG/ACT inhaler Inhale 1-2 puffs into the lungs every 6 (six) hours as needed for wheezing or shortness of breath. 09/30/19   Henderly, Britni A, PA-C  benzonatate (TESSALON) 100 MG capsule Take 1 capsule (100 mg total) by  mouth every 8 (eight) hours. 09/30/19   Henderly, Britni A, PA-C  dexamethasone (DECADRON) 6 MG tablet Take 1 tablet (6 mg total) by mouth daily for 5 days. 09/30/19 10/05/19  Henderly, Britni A, PA-C  fluticasone (FLONASE) 50 MCG/ACT nasal spray Place 2 sprays into both nostrils daily. 09/30/19   Henderly, Britni A, PA-C  ondansetron (ZOFRAN ODT) 4 MG disintegrating tablet Take 1 tablet (4 mg total) by mouth every 8 (eight) hours as needed for nausea or vomiting. 09/30/19   Henderly, Britni A, PA-C  oxyCODONE-acetaminophen (PERCOCET/ROXICET) 5-325 MG tablet Take 1 tablet by mouth every 6 (six) hours as needed for severe pain. 10/02/19   Breck Coons, MD    Allergies    Patient has no known allergies.  Review of Systems   Review of Systems All other systems reviewed and are negative except that which was mentioned in HPI  Physical Exam Updated Vital Signs BP 110/82   Pulse (!) 108   Temp (!) 101 F (38.3 C) (Oral)   Resp (!) 24   Ht 5\' 5"  (1.651 m)   Wt 133.8 kg   SpO2 92%   BMI 49.09 kg/m   Physical Exam Vitals and nursing note reviewed.  Constitutional:      General: She is in acute distress (mild).     Appearance: She is well-developed. She is obese. She  is ill-appearing and diaphoretic.  HENT:     Head: Normocephalic and atraumatic.  Eyes:     Conjunctiva/sclera: Conjunctivae normal.  Cardiovascular:     Rate and Rhythm: Regular rhythm. Tachycardia present.     Heart sounds: Normal heart sounds. No murmur heard.   Pulmonary:     Effort: Tachypnea present.     Comments: Shallow inspirations Abdominal:     General: There is no distension.     Palpations: Abdomen is soft.     Tenderness: There is no abdominal tenderness.  Musculoskeletal:     Cervical back: Neck supple.     Right lower leg: No edema.     Left lower leg: No edema.  Skin:    General: Skin is warm.  Neurological:     Mental Status: She is alert and oriented to person, place, and time.     Comments:  Fluent speech  Psychiatric:        Judgment: Judgment normal.     ED Results / Procedures / Treatments   Labs (all labs ordered are listed, but only abnormal results are displayed) Labs Reviewed  CBC WITH DIFFERENTIAL/PLATELET - Abnormal; Notable for the following components:      Result Value   Lymphs Abs 0.6 (*)    All other components within normal limits  COMPREHENSIVE METABOLIC PANEL - Abnormal; Notable for the following components:   Glucose, Bld 113 (*)    Creatinine, Ser 1.08 (*)    Calcium 8.3 (*)    AST 57 (*)    All other components within normal limits  D-DIMER, QUANTITATIVE (NOT AT [facility]) - Abnormal; Notable for the following components:   D-Dimer, Quant 2.41 (*)    All other components within normal limits  LACTATE DEHYDROGENASE - Abnormal; Notable for the following components:   LDH 540 (*)    All other components within normal limits  FERRITIN - Abnormal; Notable for the following components:   Ferritin 346 (*)    All other components within normal limits  FIBRINOGEN - Abnormal; Notable for the following components:   Fibrinogen 649 (*)    All other components within normal limits  C-REACTIVE PROTEIN - Abnormal; Notable for the following components:   CRP 9.2 (*)    All other components within normal limits  CULTURE, BLOOD (ROUTINE X 2)  CULTURE, BLOOD (ROUTINE X 2)  LACTIC ACID, PLASMA  LACTIC ACID, PLASMA  PROCALCITONIN  TRIGLYCERIDES  HIV ANTIBODY (ROUTINE TESTING W REFLEX)  CBC  CREATININE, SERUM  BRAIN NATRIURETIC PEPTIDE  PROCALCITONIN  HEPATITIS B SURFACE ANTIGEN  I-STAT BETA HCG BLOOD, ED ([facility], [facility], [facility] ONLY)  ABO/RH  TROPONIN I (HIGH SENSITIVITY)    EKG EKG Interpretation  Date/Time:  Monday October 04 2019 10:42:41 EDT Ventricular Rate:  106 PR Interval:    QRS Duration: 103 QT Interval:  320 QTC Calculation: 425 R Axis:   83 Text Interpretation: Sinus tachycardia Probable left atrial enlargement RSR' in V1 or V2, right VCD or RVH No  significant change since last tracing Confirmed by Theotis Burrow [phone]) on 10/04/2019 11:08:11 AM   Radiology DG Chest Port 1 View  Result Date: 10/04/2019 CLINICAL DATA:  Shortness of breath. EXAM: PORTABLE CHEST 1 VIEW COMPARISON:  Same day. FINDINGS: The heart size and mediastinal contours are within normal limits. No pneumothorax or pleural effusion is noted. Continued presence of multiple opacity seen throughout both lungs concerning for multifocal pneumonia. The visualized skeletal structures are unremarkable. IMPRESSION: Continued presence of multiple bilateral  lung opacities concerning for multifocal pneumonia. Electronically Signed   By: Marijo Conception M.D.   On: 10/04/2019 11:24   DG Chest Portable 1 View  Result Date: 10/02/2019 CLINICAL DATA:  Worsening shortness of breath, COVID-19 positive EXAM: PORTABLE CHEST 1 VIEW COMPARISON:  09/30/2019 FINDINGS: Single frontal view of the chest demonstrates a stable cardiac silhouette. Lung volumes are diminished. The bilateral ground-glass airspace disease seen on prior chest x-ray is not significantly changed on this exam. No effusion or pneumothorax. No acute bony abnormality. IMPRESSION: 1. Stable multifocal pneumonia.  Pattern consistent with COVID-19. Electronically Signed   By: Randa Ngo M.D.   On: 10/02/2019 16:56    Procedures .Critical Care Performed by: Sharlett Iles, MD Authorized by: Sharlett Iles, MD   Critical care provider statement:    Critical care time (minutes):  30   Critical care time was exclusive of:  Separately billable procedures and treating other patients   Critical care was necessary to treat or prevent imminent or life-threatening deterioration of the following conditions:  Respiratory failure   Critical care was time spent personally by me on the following activities:  Development of treatment plan with patient or surrogate, evaluation of patient's response to treatment, examination of  patient, obtaining history from patient or surrogate, ordering and performing treatments and interventions, ordering and review of laboratory studies, ordering and review of radiographic studies, re-evaluation of patient's condition and review of old charts   (including critical care time)  Medications Ordered in ED Medications  enoxaparin (LOVENOX) injection 65 mg (has no administration in time range)  remdesivir 200 mg in sodium chloride 0.9% 250 mL IVPB (has no administration in time range)    Followed by  remdesivir 100 mg in sodium chloride 0.9 % 100 mL IVPB (has no administration in time range)  albuterol (VENTOLIN HFA) 108 (90 Base) MCG/ACT inhaler 2 puff (has no administration in time range)  methylPREDNISolone sodium succinate (SOLU-MEDROL) 125 mg/2 mL injection 66.875 mg (has no administration in time range)  guaiFENesin-dextromethorphan (ROBITUSSIN DM) 100-10 MG/5ML syrup 10 mL (has no administration in time range)  chlorpheniramine-HYDROcodone (TUSSIONEX) 10-8 MG/5ML suspension 5 mL (has no administration in time range)  ascorbic acid (VITAMIN C) tablet 500 mg (has no administration in time range)  zinc sulfate capsule 220 mg (has no administration in time range)  acetaminophen (TYLENOL) tablet 650 mg (has no administration in time range)  ondansetron (ZOFRAN) tablet 4 mg (has no administration in time range)    Or  ondansetron (ZOFRAN) injection 4 mg (has no administration in time range)  ibuprofen (ADVIL) tablet 600 mg (600 mg Oral Given 10/04/19 1158)    ED Course  I have reviewed the triage vital signs and the nursing notes.  Pertinent labs & imaging results that were available during my care of the patient were reviewed by me and considered in my medical decision making (see chart for details).    MDM Rules/Calculators/A&P                          Pt ill appearing and hypoxic on exam, febrile, BP reassuring, 91% on 6L [place]. Mentating appropriately.  Lab work shows  reassuring CMP and CBC, multiple elevated inflammatory markers assistant with known COVID-19 infection.  During 1 of patient's recent evaluations, she had CTA of chest which was negative for PE and she states that today's symptoms are similar to previous visits.  Chest x-ray consistent with viral  infection.  Because of her significant hypoxia, discussed admission with [facility] hospitalist, Dr. Doristine Bosworth. She has remained 92-93% on 6L.   Sarah Calhoun was evaluated in Emergency Department on 10/04/2019 for the symptoms described in the history of present illness. She was evaluated in the context of the global COVID-19 pandemic, which necessitated consideration that the patient might be at risk for infection with the SARS-CoV-2 virus that causes COVID-19. Institutional protocols and algorithms that pertain to the evaluation of patients at risk for COVID-19 are in a state of rapid change based on information released by regulatory bodies including the CDC and federal and state organizations. These policies and algorithms were followed during the patient's care in the ED.  Final Clinical Impression(s) / ED Diagnoses Final diagnoses:  None    Rx / DC Orders ED Discharge Orders    None       Elissa Grieshop, Wenda Overland, MD 10/04/19 1407

## 2019-10-04 NOTE — ED Triage Notes (Addendum)
Patient complains of severe SOB since early am, complaining of chest heaviness, patient speaking 1 word sentences, diagnosed with Covid this past Thursday. Patient states I cannot get a breath, oxygen applied on assessment. Alert and oriented.

## 2019-10-04 NOTE — ED Notes (Addendum)
RN paged MD T. Opyd, to inform him of pt oxygen status

## 2019-10-05 LAB — COMPREHENSIVE METABOLIC PANEL
ALT: 31 U/L (ref 0–44)
AST: 51 U/L — ABNORMAL HIGH (ref 15–41)
Albumin: 3.4 g/dL — ABNORMAL LOW (ref 3.5–5.0)
Alkaline Phosphatase: 39 U/L (ref 38–126)
Anion gap: 13 (ref 5–15)
BUN: 10 mg/dL (ref 6–20)
CO2: 25 mmol/L (ref 22–32)
Calcium: 8.3 mg/dL — ABNORMAL LOW (ref 8.9–10.3)
Chloride: 103 mmol/L (ref 98–111)
Creatinine, Ser: 0.86 mg/dL (ref 0.44–1.00)
GFR calc Af Amer: >60 mL/min (ref >60)
GFR calc non Af Amer: >60 mL/min (ref >60)
Glucose, Bld: 146 mg/dL — ABNORMAL HIGH (ref 70–99)
Potassium: 3.8 mmol/L (ref 3.5–5.1)
Sodium: 141 mmol/L (ref 135–145)
Total Bilirubin: 0.7 mg/dL (ref 0.3–1.2)
Total Protein: 7.2 g/dL (ref 6.5–8.1)

## 2019-10-05 LAB — BRAIN NATRIURETIC PEPTIDE: B Natriuretic Peptide: 13.8 pg/mL (ref 0.0–100.0)

## 2019-10-05 LAB — CBC WITH DIFFERENTIAL/PLATELET
Abs Immature Granulocytes: 0.08 10*3/uL — ABNORMAL HIGH (ref 0.00–0.07)
Basophils Absolute: 0 10*3/uL (ref 0.0–0.1)
Basophils Relative: 0 %
Eosinophils Absolute: 0 10*3/uL (ref 0.0–0.5)
Eosinophils Relative: 0 %
HCT: 40.7 % (ref 36.0–46.0)
Hemoglobin: 13.5 g/dL (ref 12.0–15.0)
Immature Granulocytes: 2 %
Lymphocytes Relative: 15 %
Lymphs Abs: 0.5 10*3/uL — ABNORMAL LOW (ref 0.7–4.0)
MCH: 28.8 pg (ref 26.0–34.0)
MCHC: 33.2 g/dL (ref 30.0–36.0)
MCV: 86.8 fL (ref 80.0–100.0)
Monocytes Absolute: 0.3 10*3/uL (ref 0.1–1.0)
Monocytes Relative: 7 %
Neutro Abs: 2.7 10*3/uL (ref 1.7–7.7)
Neutrophils Relative %: 76 %
Platelets: 192 10*3/uL (ref 150–400)
RBC: 4.69 MIL/uL (ref 3.87–5.11)
RDW: 14.6 % (ref 11.5–15.5)
WBC: 3.6 10*3/uL — ABNORMAL LOW (ref 4.0–10.5)
nRBC: 0 % (ref 0.0–0.2)

## 2019-10-05 LAB — C-REACTIVE PROTEIN: CRP: 7.9 mg/dL — ABNORMAL HIGH (ref <1.0)

## 2019-10-05 LAB — MAGNESIUM: Magnesium: 2.8 mg/dL — ABNORMAL HIGH (ref 1.7–2.4)

## 2019-10-05 LAB — FERRITIN: Ferritin: 384 ng/mL — ABNORMAL HIGH (ref 11–307)

## 2019-10-05 LAB — PHOSPHORUS: Phosphorus: 3.4 mg/dL (ref 2.5–4.6)

## 2019-10-05 LAB — D-DIMER, QUANTITATIVE: D-Dimer, Quant: 2 ug/mL-FEU — ABNORMAL HIGH (ref 0.00–0.50)

## 2019-10-05 LAB — HCG, QUANTITATIVE, PREGNANCY: hCG, Beta Chain, Quant, S: 1 m[IU]/mL (ref <5)

## 2019-10-05 MED ORDER — BARICITINIB 2 MG PO TABS
4.0000 mg | ORAL_TABLET | Freq: Every day | ORAL | Status: DC
  Administered 2019-10-05 – 2019-10-15 (×11): 4 mg via ORAL
  Filled 2019-10-05 (×10): qty 2

## 2019-10-05 MED ORDER — METHYLPREDNISOLONE SODIUM SUCC 125 MG IJ SOLR
80.0000 mg | Freq: Two times a day (BID) | INTRAMUSCULAR | Status: DC
  Administered 2019-10-05 – 2019-10-13 (×16): 80 mg via INTRAVENOUS
  Filled 2019-10-05 (×16): qty 2

## 2019-10-05 NOTE — ED Notes (Signed)
Pt removed her 02 because she st's she felt like it was making it hard for her to breath.  Sat's dropped into the 60's  Pt placed back on 02 and sats quickly returned to the 90's

## 2019-10-05 NOTE — Progress Notes (Signed)
PROGRESS NOTE                                                                                                                                                                                                             Patient Demographics:    Sarah Calhoun, is a 24 y.o. female, DOB - [birth year], XTA:[id number]  Outpatient Primary MD for the patient is Theodis Sato, Fransico Meadow., MD    LOS - 1  Admit date - 10/04/2019        Brief Narrative  Sarah Calhoun is a 24 y.o. female with medical history significant of hypothyroidism, morbid obesity with BMI of 49 presents to emergency department with worsening shortness of breath, body ache, fever, chills, generalized weakness and lethargy since 1 week, she became more short of breath came to the hospital and was diagnosed with severe acute hypoxic respiratory failure due to COVID-19 pneumonia and admitted to the hospital.   Subjective:    Sarah Calhoun today has, No headache, No chest pain, No abdominal pain - No Nausea, No new weakness tingling or numbness, +ve cough & SOB.   Assessment  & Plan :    1. Acute Hypoxic Resp. Failure due to Acute Covid 19 Viral Pneumonitis during the ongoing 2020 Covid 19 Pandemic - she has severe parenchymal lung injury and has been started on IV steroids high-dose along with remdesivir and bosutinib.  She is currently on heated high flow oxygen which will be continued.  She is extremely tenuous.  CT angiogram chest rules out PE.  No sepsis in my evaluation.  Encouraged the patient to sit up in chair in the daytime use I-S and flutter valve for pulmonary toiletry and then prone in bed when at night.  Will advance activity and titrate down oxygen as possible.  Actemra/Baricitinib  off label use - patient was told that if COVID-19 pneumonitis gets worse we might potentially use Actemra off label, patient denies any known history of active diverticulitis,  tuberculosis or hepatitis, understands the risks and benefits and wants to proceed with Actemra treatment if required.     SpO2: (!) 89 % O2 Flow Rate (L/min): 30 L/min FiO2 (%): 90 %  Recent Labs  Lab 09/30/19 1700 09/30/19 2009 10/02/19 1626 10/04/19 1201 10/05/19 0248  WBC  --  5.7 9.8 4.4 3.6*  PLT  --  202 218 179 192  CRP  --   --   --  9.2* 7.9*  AST  --   --   --  57* 51*  ALT  --   --   --  28 31  ALKPHOS  --   --   --  39 39  BILITOT  --   --   --  0.9 0.7  ALBUMIN  --   --   --  3.6 3.4*  DDIMER  --   --   --  2.41* 2.00*  PROCALCITON  --   --   --  0.10   0.12  --   LATICACIDVEN  --   --   --  1.0  --   SARSCOV2NAA POSITIVE*  --   --   --   --     2.  Hypothyroidism.  Continue home dose Synthroid, TSH mildly elevated PCP to repeat in 4 to 6 weeks.  This could be sick euthyroid.  3.  Morbid obesity BMI of 49.  Follow with PCP for weight loss.      Condition - Extremely Guarded  Family Communication  : Spouse on 10/05/2019 [phone] message left at 11:46 AM  Code Status :  Full  Consults  :  None  Procedures  :    CTA - NO PE  PUD Prophylaxis : None  Disposition Plan  :    Status is: Inpatient  Remains inpatient appropriate because:IV treatments appropriate due to intensity of illness or inability to take PO   Dispo: The patient is from: Home              Anticipated d/c is to: Home              Anticipated d/c date is: > 3 days              Patient currently is not medically stable to d/c.   DVT Prophylaxis  :  Lovenox    Lab Results  Component Value Date   PLT 192 10/05/2019    Diet :  Diet Order            DIET SOFT Room service appropriate? Yes; Fluid consistency: Thin  Diet effective now                  Inpatient Medications  Scheduled Meds:  albuterol  2 puff Inhalation Q6H   vitamin C  500 mg Oral Daily   baricitinib  4 mg Oral Daily   enoxaparin (LOVENOX) injection  65 mg Subcutaneous Q24H   levothyroxine   200 mcg Oral QAC breakfast   methylPREDNISolone (SOLU-MEDROL) injection  80 mg Intravenous Q12H   sodium chloride flush  10-40 mL Intracatheter Q12H   zinc sulfate  220 mg Oral Daily   Continuous Infusions:  remdesivir 100 mg in NS 100 mL 100 mg (10/05/19 1042)   PRN Meds:.acetaminophen, chlorpheniramine-HYDROcodone, guaiFENesin-dextromethorphan, [DISCONTINUED] ondansetron **OR** ondansetron (ZOFRAN) IV, sodium chloride flush  Antibiotics  :    Anti-infectives (From admission, onward)   Start     Dose/Rate Route Frequency Ordered Stop   10/05/19 1000  remdesivir 100 mg in sodium chloride 0.9 % 100 mL IVPB       "Followed by" Linked Group Details   100 mg 200 mL/hr over 30 Minutes Intravenous Daily 10/04/19 1358 10/09/19 0959   10/04/19 1400  remdesivir 200 mg in sodium chloride 0.9% 250 mL IVPB       "Followed by"  Linked Group Details   200 mg 580 mL/hr over 30 Minutes Intravenous Once 10/04/19 1358 10/04/19 1635       Time Spent in minutes  30   Lala Lund M.D on 10/05/2019 at 11:40 AM  To page go to [email or web address] - password [facility]  [facility] -  Office  [phone]     See all Orders from today for further details    Objective:   Vitals:   10/05/19 0627 10/05/19 0800 10/05/19 0802 10/05/19 0841  BP:  (!) 143/91    Pulse: 92 95    Resp: 20 16    Temp:   98.7 F (37.1 C)   TempSrc:   Oral   SpO2: 92% 93%  (!) 89%  Weight:      Height:        Wt Readings from Last 3 Encounters:  10/04/19 133.8 kg  09/30/19 133.8 kg     Intake/Output Summary (Last 24 hours) at 10/05/2019 1140 Last data filed at 10/05/2019 1046 Gross per 24 hour  Intake 250 ml  Output 1200 ml  Net -950 ml     Physical Exam  Awake Alert, No new F.N deficits, Normal affect [place].AT,PERRAL Supple Neck,No JVD, No cervical lymphadenopathy appriciated.  Symmetrical Chest wall movement, Good air movement bilaterally, CTAB RRR,No Gallops,Rubs or new Murmurs, No Parasternal  Heave +ve B.Sounds, Abd Soft, No tenderness, No organomegaly appriciated, No rebound - guarding or rigidity. No Cyanosis, Clubbing or edema, No new Rash or bruise      Data Review:    CBC Recent Labs  Lab 09/30/19 2009 10/02/19 1626 10/04/19 1201 10/05/19 0248  WBC 5.7 9.8 4.4 3.6*  HGB 13.5 13.3 14.0 13.5  HCT 40.2 39.1 42.7 40.7  PLT 202 218 179 192  MCV 84.5 83.4 87.9 86.8  MCH 28.4 28.4 28.8 28.8  MCHC 33.6 34.0 32.8 33.2  RDW 14.4 14.3 14.7 14.6  LYMPHSABS 0.9  --  0.6* 0.5*  MONOABS 0.5  --  0.3 0.3  EOSABS 0.0  --  0.0 0.0  BASOSABS 0.0  --  0.0 0.0    Recent Labs  Lab 09/30/19 2009 10/02/19 1626 10/04/19 1200 10/04/19 1201 10/05/19 0248 10/05/19 0718  NA 138 136  --  140 141  --   K 3.2* 3.6  --  3.7 3.8  --   CL 102 100  --  102 103  --   CO2 24 24  --  26 25  --   GLUCOSE 99 106*  --  113* 146*  --   BUN 10 13  --  8 10  --   CREATININE 1.28* 1.11*  --  1.04*   1.08* 0.86  --   CALCIUM 8.3* 8.3*  --  8.3* 8.3*  --   AST  --   --   --  57* 51*  --   ALT  --   --   --  28 31  --   ALKPHOS  --   --   --  39 39  --   BILITOT  --   --   --  0.9 0.7  --   ALBUMIN  --   --   --  3.6 3.4*  --   MG  --   --   --   --  2.8*  --   CRP  --   --   --  9.2* 7.9*  --   DDIMER  --   --   --  2.41* 2.00*  --   PROCALCITON  --   --   --  0.10   0.12  --   --   LATICACIDVEN  --   --   --  1.0  --   --   TSH  --   --   --  8.396*  --   --   BNP 18.2  --  46.6  --   --  13.8    ------------------------------------------------------------------------------------------------------------------ Recent Labs    10/04/19 1201  TRIG 169*    No results found for: HGBA1C ------------------------------------------------------------------------------------------------------------------ Recent Labs    10/04/19 1201  TSH 8.396*    Cardiac Enzymes No results for input(s): CKMB, TROPONINI, MYOGLOBIN in the last 168 hours.  Invalid input(s):  CK ------------------------------------------------------------------------------------------------------------------    Component Value Date/Time   BNP 13.8 10/05/2019 9678    Micro Results Recent Results (from the past 240 hour(s))  SARS Coronavirus 2 by RT PCR (hospital order, performed in [facility] hospital lab) Nasopharyngeal Nasopharyngeal Swab     Status: Abnormal   Collection Time: 09/30/19  5:00 PM   Specimen: Nasopharyngeal Swab  Result Value Ref Range Status   SARS Coronavirus 2 POSITIVE (A) NEGATIVE Final    Comment: RESULT CALLED TO, READ BACK BY AND VERIFIED WITH: MARVA SIMMS RN @1848  09/30/2019 OLSONM (NOTE) SARS-CoV-2 target nucleic acids are DETECTED  SARS-CoV-2 RNA is generally detectable in upper respiratory specimens  during the acute phase of infection.  Positive results are indicative  of the presence of the identified virus, but do not rule out bacterial infection or co-infection with other pathogens not detected by the test.  Clinical correlation with patient history and  other diagnostic information is necessary to determine patient infection status.  The expected result is negative.  Fact Sheet for Patients:   StrictlyIdeas.no   Fact Sheet for Healthcare Providers:   BankingDealers.co.za    This test is not yet approved or cleared by the [place] FDA and  has been authorized for detection and/or diagnosis of SARS-CoV-2 by FDA under an Emergency Use Authorization (EUA).  This EUA will remain in effect (meaning thi s test can be used) for the duration of  the COVID-19 declaration under Section 564(b)(1) of the Act, 21 U.S.C. section 360-bbb-3(b)(1), unless the authorization is terminated or revoked sooner.  Performed at [facility], [address] [place], [place] [address]   Blood Culture (routine x 2)     Status: None (Preliminary result)   Collection Time: 10/04/19 12:01 PM    Specimen: BLOOD RIGHT ARM  Result Value Ref Range Status   Specimen Description BLOOD RIGHT ARM  Final   Special Requests   Final    BOTTLES DRAWN AEROBIC AND ANAEROBIC Blood Culture adequate volume   Culture   Final    NO GROWTH < 24 HOURS Performed at [facility], [place] [address] [place], [place] [address]    Report Status PENDING  Incomplete  Blood Culture (routine x 2)     Status: None (Preliminary result)   Collection Time: 10/04/19  1:50 PM   Specimen: BLOOD  Result Value Ref Range Status   Specimen Description BLOOD LEFT PICC LINE  Final   Special Requests   Final    BOTTLES DRAWN AEROBIC AND ANAEROBIC Blood Culture adequate volume   Culture   Final    NO GROWTH < 24 HOURS Performed at [facility], [place] [address] [place], [place] [address]  Report Status PENDING  Incomplete    Radiology Reports CT Angio Chest PE W and/or Wo Contrast  Result Date: 09/30/2019 CLINICAL DATA:  Dry cough, sore throat, fever, chills, COVID-19 positive EXAM: CT ANGIOGRAPHY CHEST WITH CONTRAST TECHNIQUE: Multidetector CT imaging of the chest was performed using the standard protocol during bolus administration of intravenous contrast. Multiplanar CT image reconstructions and MIPs were obtained to evaluate the vascular anatomy. CONTRAST:  168mL OMNIPAQUE IOHEXOL 350 MG/ML SOLN COMPARISON:  09/30/2019 FINDINGS: Cardiovascular: This is a technically adequate evaluation of the pulmonary vasculature. There are no filling defects or pulmonary emboli. The heart is unremarkable with no significant pericardial effusion. Thoracic aorta is unremarkable. Mediastinum/Nodes: Thyroid, trachea, and esophagus are unremarkable. Small lymph nodes are seen in the bilateral hilar regions, likely reactive. No pathologic adenopathy. Lungs/Pleura: Nodular airspace disease is seen bilaterally throughout the lungs, most consistent with multifocal pneumonia. No effusion or pneumothorax. Central airways are patent.  Upper Abdomen: No acute abnormality. Musculoskeletal: No acute or destructive bony lesions. Reconstructed images demonstrate no additional findings. Review of the MIP images confirms the above findings. IMPRESSION: 1. No evidence of pulmonary embolus. 2. Multifocal bilateral nodular airspace disease, compatible with COVID-19 pneumonia. Electronically Signed   By: Randa Ngo M.D.   On: 09/30/2019 21:05   DG Chest Port 1 View  Result Date: 10/04/2019 CLINICAL DATA:  Shortness of breath. EXAM: PORTABLE CHEST 1 VIEW COMPARISON:  Same day. FINDINGS: The heart size and mediastinal contours are within normal limits. No pneumothorax or pleural effusion is noted. Continued presence of multiple opacity seen throughout both lungs concerning for multifocal pneumonia. The visualized skeletal structures are unremarkable. IMPRESSION: Continued presence of multiple bilateral lung opacities concerning for multifocal pneumonia. Electronically Signed   By: Marijo Conception M.D.   On: 10/04/2019 11:24   DG Chest Portable 1 View  Result Date: 10/02/2019 CLINICAL DATA:  Worsening shortness of breath, COVID-19 positive EXAM: PORTABLE CHEST 1 VIEW COMPARISON:  09/30/2019 FINDINGS: Single frontal view of the chest demonstrates a stable cardiac silhouette. Lung volumes are diminished. The bilateral ground-glass airspace disease seen on prior chest x-ray is not significantly changed on this exam. No effusion or pneumothorax. No acute bony abnormality. IMPRESSION: 1. Stable multifocal pneumonia.  Pattern consistent with COVID-19. Electronically Signed   By: Randa Ngo M.D.   On: 10/02/2019 16:56   DG Chest Portable 1 View  Result Date: 09/30/2019 CLINICAL DATA:  COVID.  Headache. EXAM: PORTABLE CHEST 1 VIEW COMPARISON:  None. FINDINGS: Normal cardiac silhouette. Central venous pulmonary congestion. No focal infiltrate. No pleural fluid. No pneumothorax. IMPRESSION: Central venous congestion.  No viral pneumonia identified.  Electronically Signed   By: Suzy Bouchard M.D.   On: 09/30/2019 19:26

## 2019-10-05 NOTE — ED Notes (Signed)
Pt up to bedside commode

## 2019-10-05 NOTE — ED Notes (Signed)
Pt eating app[e sauce at this time

## 2019-10-05 NOTE — ED Notes (Addendum)
Pt was c/o water leaking from HFNC, water was going in nose- called RT, RT now at bedside

## 2019-10-05 NOTE — ED Notes (Signed)
Pt c/o feeling nauseous pt was medicated per [facility]

## 2019-10-06 DIAGNOSIS — R739 Hyperglycemia, unspecified: Secondary | ICD-10-CM

## 2019-10-06 DIAGNOSIS — E039 Hypothyroidism, unspecified: Secondary | ICD-10-CM

## 2019-10-06 DIAGNOSIS — J1282 Pneumonia due to coronavirus disease 2019: Secondary | ICD-10-CM

## 2019-10-06 LAB — COMPREHENSIVE METABOLIC PANEL
ALT: 32 U/L (ref 0–44)
AST: 37 U/L (ref 15–41)
Albumin: 3.4 g/dL — ABNORMAL LOW (ref 3.5–5.0)
Alkaline Phosphatase: 36 U/L — ABNORMAL LOW (ref 38–126)
Anion gap: 9 (ref 5–15)
BUN: 15 mg/dL (ref 6–20)
CO2: 28 mmol/L (ref 22–32)
Calcium: 8.5 mg/dL — ABNORMAL LOW (ref 8.9–10.3)
Chloride: 105 mmol/L (ref 98–111)
Creatinine, Ser: 0.93 mg/dL (ref 0.44–1.00)
GFR calc Af Amer: >60 mL/min (ref >60)
GFR calc non Af Amer: >60 mL/min (ref >60)
Glucose, Bld: 178 mg/dL — ABNORMAL HIGH (ref 70–99)
Potassium: 4 mmol/L (ref 3.5–5.1)
Sodium: 142 mmol/L (ref 135–145)
Total Bilirubin: 0.7 mg/dL (ref 0.3–1.2)
Total Protein: 7.2 g/dL (ref 6.5–8.1)

## 2019-10-06 LAB — CBC WITH DIFFERENTIAL/PLATELET
Abs Immature Granulocytes: 0.23 10*3/uL — ABNORMAL HIGH (ref 0.00–0.07)
Basophils Absolute: 0 10*3/uL (ref 0.0–0.1)
Basophils Relative: 0 %
Eosinophils Absolute: 0 10*3/uL (ref 0.0–0.5)
Eosinophils Relative: 0 %
HCT: 43.3 % (ref 36.0–46.0)
Hemoglobin: 14.4 g/dL (ref 12.0–15.0)
Immature Granulocytes: 4 %
Lymphocytes Relative: 13 %
Lymphs Abs: 0.8 10*3/uL (ref 0.7–4.0)
MCH: 28.5 pg (ref 26.0–34.0)
MCHC: 33.3 g/dL (ref 30.0–36.0)
MCV: 85.6 fL (ref 80.0–100.0)
Monocytes Absolute: 0.5 10*3/uL (ref 0.1–1.0)
Monocytes Relative: 8 %
Neutro Abs: 5 10*3/uL (ref 1.7–7.7)
Neutrophils Relative %: 75 %
Platelets: 248 10*3/uL (ref 150–400)
RBC: 5.06 MIL/uL (ref 3.87–5.11)
RDW: 14.6 % (ref 11.5–15.5)
WBC: 6.6 10*3/uL (ref 4.0–10.5)
nRBC: 0 % (ref 0.0–0.2)

## 2019-10-06 LAB — D-DIMER, QUANTITATIVE: D-Dimer, Quant: 1.57 ug/mL-FEU — ABNORMAL HIGH (ref 0.00–0.50)

## 2019-10-06 LAB — TROPONIN I (HIGH SENSITIVITY)
Troponin I (High Sensitivity): <2 ng/L (ref <18)
Troponin I (High Sensitivity): 2 ng/L (ref <18)

## 2019-10-06 LAB — C-REACTIVE PROTEIN: CRP: 4.1 mg/dL — ABNORMAL HIGH (ref <1.0)

## 2019-10-06 LAB — ABO/RH: ABO/RH(D): A POS

## 2019-10-06 LAB — GLUCOSE, CAPILLARY: Glucose-Capillary: 126 mg/dL — ABNORMAL HIGH (ref 70–99)

## 2019-10-06 LAB — BRAIN NATRIURETIC PEPTIDE: B Natriuretic Peptide: 19.4 pg/mL (ref 0.0–100.0)

## 2019-10-06 LAB — MAGNESIUM: Magnesium: 2.8 mg/dL — ABNORMAL HIGH (ref 1.7–2.4)

## 2019-10-06 MED ORDER — ALUM & MAG HYDROXIDE-SIMETH 200-200-20 MG/5ML PO SUSP
30.0000 mL | Freq: Once | ORAL | Status: AC
  Administered 2019-10-06: 30 mL via ORAL
  Filled 2019-10-06: qty 30

## 2019-10-06 MED ORDER — IPRATROPIUM-ALBUTEROL 20-100 MCG/ACT IN AERS
1.0000 | INHALATION_SPRAY | Freq: Four times a day (QID) | RESPIRATORY_TRACT | Status: DC
  Administered 2019-10-06 – 2019-10-15 (×36): 1 via RESPIRATORY_TRACT
  Filled 2019-10-06: qty 4

## 2019-10-06 MED ORDER — PHENOL 1.4 % MT LIQD
1.0000 | OROMUCOSAL | Status: DC | PRN
  Administered 2019-10-06: 1 via OROMUCOSAL
  Filled 2019-10-06: qty 177

## 2019-10-06 MED ORDER — PANTOPRAZOLE SODIUM 40 MG PO TBEC
40.0000 mg | DELAYED_RELEASE_TABLET | Freq: Two times a day (BID) | ORAL | Status: DC
  Administered 2019-10-06 – 2019-10-15 (×18): 40 mg via ORAL
  Filled 2019-10-06 (×17): qty 1

## 2019-10-06 MED ORDER — ALBUTEROL SULFATE HFA 108 (90 BASE) MCG/ACT IN AERS
1.0000 | INHALATION_SPRAY | RESPIRATORY_TRACT | Status: DC | PRN
  Administered 2019-10-06 – 2019-10-09 (×3): 2 via RESPIRATORY_TRACT
  Administered 2019-10-10: 1 via RESPIRATORY_TRACT
  Administered 2019-10-11 – 2019-10-15 (×3): 2 via RESPIRATORY_TRACT

## 2019-10-06 MED ORDER — PANTOPRAZOLE SODIUM 40 MG PO TBEC: 40.0000 mg | DELAYED_RELEASE_TABLET | Freq: Every day | ORAL | Status: DC

## 2019-10-06 NOTE — Progress Notes (Addendum)
PROGRESS NOTE    Sarah Calhoun  CLE:[id number] DOB: 1995-11-19 DOA: 10/04/2019 PCP: Loraine Leriche., MD   Brief Narrative:  [name] a 24 y.o.femalewith medical history significant ofhypothyroidism, morbid obesity with BMI of 49 who presented to emergency department with worsening shortness of breath, body ache, fever, chills, generalized weakness and lethargy since 1 week, she became more short of breath came to the hospital and was diagnosed with severe acute hypoxic respiratory failure due to COVID-19 pneumonia and admitted to the hospital.  Currently she is in the progressive care unit requiring heated high flow supplemental oxygen via nasal cannula and has received steroids, remdesivir and off label Baricitinib.    Assessment & Plan:   Principal Problem:   Acute hypoxemic respiratory failure due to COVID-19 [facility]) Active Problems:   Sepsis ([place])   Hypothyroidism  Severe Viral Sepsis, poA Acute Respiratory Failure with Hypoxia 2/2 COVID-19 Pneumonia -SARS-CoV-2 PCR positive 09/30/19 -She has severe parenchymal lung injury and has been started on IV steroids high-dose along with Remdesivir and Baricitinib.  -Patient met criteria for severe sepsis on admission given SIRS criteria of a respiratory rate > 20 (24), HR >90 (113), and a Temperature of 101 with evidence of organ dysfunction given Acute Respiratory Failure with Hypoxia -Initial CXR showed "Central venous congestion.  No viral pneumonia identified" -CTA of the Chest showed "No evidence of pulmonary embolus. Multifocal bilateral nodular airspace disease, compatible with COVID-19 pneumonia -Repeat CXR 10/04/19 done and showed "The heart size and mediastinal contours are within normal limits. No pneumothorax or pleural effusion is noted. Continued presence of multiple opacity seen throughout both lungs concerning for multifocal pneumonia. The visualized skeletal structures are Unremarkable." -Sepsis Physiology is  improved but Respiratory Status has worsened and is tenuous -Inflammatory Marker Trend: Recent Labs    10/04/19 1201 10/05/19 0248 10/06/19 0129  DDIMER 2.41* 2.00* 1.57*  FERRITIN 346* 384*  --   LDH 540*  --   --   CRP 9.2* 7.9* 4.1*  -TG was 169 -BNP went from 46.6 -> 13.8 -> 19.4 -Fibrinogen was 649 Lab Results  Component Value Date   SARSCOV2NAA POSITIVE (A) 09/30/2019   -PCT was 0.10 / 0.12 on Admission  -SpO2: 90 % O2 Flow Rate (L/min): 25 L/min FiO2 (%): 60 % -Continue Remdesivir x5 days 10/04/19 ->  -Steroids x10 days; Currently getting IV Solu-Medrol 80 mg every 12 for 10 days total  -Received Baricitinib Off Label and continuing to get it 4 mg po daily x 14 Days (Day 2/14) started 10/05/19 -> ; patient denies any known history of active diverticulitis, tuberculosis or hepatitis, understands the risks and benefits and wanted to proceed with Baricitinb treatment if required -CRP was grossly elevated at 9.2 -> 7.9 ->4.1 -Continue Airborne, contact precautions for 21 days from positive testing (since patient is admitted for COVID) -Contine Monitor inflammatory markers and LFTs.  -Encourage OOB, IS, FV -C/w Acetaminophen 650 mg po q6prn for Fever  -Enoxaparin 65 mg q24h  -Blood Cx x2 showed NGTD at 2 Days  -Added Combivent 2 puff IH q6h and Changed Albuterol to 1-2 puff IH q4hprn -Continue to monitor temperature curve and continue with acetaminophen for mild fever -Antitussives with p.o. Robitussin-DM and Tussionex; Will add Benzonatate 100 mg po TIDprn as well  -Continue with zinc sulfate 220 mg p.o. daily along with vitamin C 500 mg p.o. daily -Prone if able -Has a high risk for decompensation and respiratory status is tenuous -Continue monitor respiratory status carefully and  wean O2 as tolerated  -Repeat chest x-ray in the a.m. -Will need an ambulatory home O2 screen prior to discharge   Hypothyroidism. -Continue home dose Levothyroxine 200 mcg po Daily,  -TSH  mildly elevated PCP to repeat in 4 to 6 weeks.   -This could be sick euthyroid.  Abnormal AST -Due to COVID viral infection  -Patient's AST went from 57 -> 51 -> 37 -Continue to Monitor and Trend   Hyperglycemia -Closely monitor CBGs since patient is on steroids and expect Steroid Demargination -Check HbA1c int the AM  -CBG's ranging from 113-178 on Daily CMP's  Morbid Obesity -Estimated body mass index is 49.09 kg/m as calculated from the following:   Height as of this encounter: $RemoveBeforeD'5\' 5"'[id number]$  (1.651 m).   Weight as of this encounter: 133.8 kg.  -Continued Weight Loss and Dietary Counseling   DVT prophylaxis: Enoxaparin 65 mg q24h Code Status: FULL CODE  Family Communication: No family present at bedside  Disposition Plan: Pending further clinical Improvement and Weaning of Oxygen   Status is: Inpatient  Remains inpatient appropriate because:Unsafe d/c plan, IV treatments appropriate due to intensity of illness or inability to take PO and Inpatient level of care appropriate due to severity of illness   Dispo: The patient is from: Home              Anticipated d/c is to: Home              Anticipated d/c date is: > 3 days              Patient currently is not medically stable to d/c.  Consultants:   None   Procedures: None  Antimicrobials:  Anti-infectives (From admission, onward)   Start     Dose/Rate Route Frequency Ordered Stop   10/05/19 1000  remdesivir 100 mg in sodium chloride 0.9 % 100 mL IVPB       "Followed by" Linked Group Details   100 mg 200 mL/hr over 30 Minutes Intravenous Daily 10/04/19 1358 10/09/19 0959   10/04/19 1400  remdesivir 200 mg in sodium chloride 0.9% 250 mL IVPB       "Followed by" Linked Group Details   200 mg 580 mL/hr over 30 Minutes Intravenous Once 10/04/19 1358 10/04/19 1635     Subjective: Seen and examined at bedside and thinks that she is doing a little bit better breathing wise.  States that she is not really coughing very much.   No nausea or vomiting.  Denies any lightheadedness or dizziness.  No other concerns or complaints at this time.  Objective: Vitals:   10/05/19 2300 10/06/19 0000 10/06/19 0113 10/06/19 0116  BP: 128/82 (!) 129/91  123/82  Pulse: 84 87  79  Resp: (!) 30 13  (!) 21  Temp:    98.1 F (36.7 C)  TempSrc:    Oral  SpO2: 98% 97% 95% 98%  Weight:      Height:        Intake/Output Summary (Last 24 hours) at 10/06/2019 0741 Last data filed at 10/05/2019 1046 Gross per 24 hour  Intake --  Output 1200 ml  Net -1200 ml   Filed Weights   10/04/19 1029  Weight: 133.8 kg   Examination: Physical Exam:  Constitutional: WN/WD morbidly obese African-American female currently in mild respiratory distress requiring heated high flow nasal cannula Eyes: Lids and conjunctivae normal, sclerae anicteric  ENMT: External Ears, Nose appear normal. Grossly normal hearing.  Neck: Appears normal, supple, no  cervical masses, normal ROM, no appreciable thyromegaly; no JVD Respiratory: Diminished to auscultation bilaterally, no wheezing, rales, rhonchi or crackles. Normal respiratory effort and patient is not tachypenic. No accessory muscle use.  Unlabored breathing Cardiovascular: RRR, no murmurs / rubs / gallops. S1 and S2 auscultated. No extremity edema.  Abdomen: Soft, non-tender, distended secondary body habitus. Bowel sounds positive.  GU: Deferred. Musculoskeletal: No clubbing / cyanosis of digits/nails. No joint deformity upper and lower extremities. Skin: No rashes, lesions, ulcers on limited skin evaluation. No induration; Warm and dry.  Neurologic: CN 2-12 grossly intact with no focal deficits. Romberg sign and cerebellar reflexes not assessed.  Psychiatric: Normal judgment and insight. Alert and oriented x 3. Normal mood and appropriate affect.   Data Reviewed: I have personally reviewed following labs and imaging studies  CBC: Recent Labs  Lab 09/30/19 2009 10/02/19 1626 10/04/19 1201  10/05/19 0248 10/06/19 0129  WBC 5.7 9.8 4.4 3.6* 6.6  NEUTROABS 4.4  --  3.4 2.7 5.0  HGB 13.5 13.3 14.0 13.5 14.4  HCT 40.2 39.1 42.7 40.7 43.3  MCV 84.5 83.4 87.9 86.8 85.6  PLT 202 218 179 192 956   Basic Metabolic Panel: Recent Labs  Lab 09/30/19 2009 10/02/19 1626 10/04/19 1201 10/05/19 0248 10/06/19 0129  NA 138 136 140 141 142  K 3.2* 3.6 3.7 3.8 4.0  CL 102 100 102 103 105  CO2 $Re'24 24 26 25 28  'zfF$ GLUCOSE 99 106* 113* 146* 178*  BUN $Re'10 13 8 10 15  'Cks$ CREATININE 1.28* 1.11* 1.04*   1.08* 0.86 0.93  CALCIUM 8.3* 8.3* 8.3* 8.3* 8.5*  MG  --   --   --  2.8* 2.8*  PHOS  --   --   --  3.4  --    GFR: Estimated Creatinine Clearance: 130.3 mL/min (by C-G formula based on SCr of 0.93 mg/dL). Liver Function Tests: Recent Labs  Lab 10/04/19 1201 10/05/19 0248 10/06/19 0129  AST 57* 51* 37  ALT 28 31 32  ALKPHOS 39 39 36*  BILITOT 0.9 0.7 0.7  PROT 7.2 7.2 7.2  ALBUMIN 3.6 3.4* 3.4*   No results for input(s): LIPASE, AMYLASE in the last 168 hours. No results for input(s): AMMONIA in the last 168 hours. Coagulation Profile: No results for input(s): INR, PROTIME in the last 168 hours. Cardiac Enzymes: No results for input(s): CKTOTAL, CKMB, CKMBINDEX, TROPONINI in the last 168 hours. BNP (last 3 results) No results for input(s): PROBNP in the last 8760 hours. HbA1C: No results for input(s): HGBA1C in the last 72 hours. CBG: No results for input(s): GLUCAP in the last 168 hours. Lipid Profile: Recent Labs    10/04/19 1201  TRIG 169*   Thyroid Function Tests: Recent Labs    10/04/19 1201  TSH 8.396*   Anemia Panel: Recent Labs    10/04/19 1201 10/05/19 0248  FERRITIN 346* 384*   Sepsis Labs: Recent Labs  Lab 10/04/19 1201  PROCALCITON 0.10   0.12  LATICACIDVEN 1.0    Recent Results (from the past 240 hour(s))  SARS Coronavirus 2 by RT PCR (hospital order, performed in [facility] hospital lab) Nasopharyngeal Nasopharyngeal Swab     Status:  Abnormal   Collection Time: 09/30/19  5:00 PM   Specimen: Nasopharyngeal Swab  Result Value Ref Range Status   SARS Coronavirus 2 POSITIVE (A) NEGATIVE Final    Comment: RESULT CALLED TO, READ BACK BY AND VERIFIED WITH: MARVA SIMMS RN $Remove'@1848'MFragbz$  09/30/2019 OLSONM (NOTE) SARS-CoV-2 target  nucleic acids are DETECTED  SARS-CoV-2 RNA is generally detectable in upper respiratory specimens  during the acute phase of infection.  Positive results are indicative  of the presence of the identified virus, but do not rule out bacterial infection or co-infection with other pathogens not detected by the test.  Clinical correlation with patient history and  other diagnostic information is necessary to determine patient infection status.  The expected result is negative.  Fact Sheet for Patients:   StrictlyIdeas.no   Fact Sheet for Healthcare Providers:   BankingDealers.co.za    This test is not yet approved or cleared by the [place] FDA and  has been authorized for detection and/or diagnosis of SARS-CoV-2 by FDA under an Emergency Use Authorization (EUA).  This EUA will remain in effect (meaning thi s test can be used) for the duration of  the COVID-19 declaration under Section 564(b)(1) of the Act, 21 U.S.C. section 360-bbb-3(b)(1), unless the authorization is terminated or revoked sooner.  Performed at [facility], [address] [place], [place] [address]   Blood Culture (routine x 2)     Status: None (Preliminary result)   Collection Time: 10/04/19 12:01 PM   Specimen: BLOOD RIGHT ARM  Result Value Ref Range Status   Specimen Description BLOOD RIGHT ARM  Final   Special Requests   Final    BOTTLES DRAWN AEROBIC AND ANAEROBIC Blood Culture adequate volume   Culture   Final    NO GROWTH < 24 HOURS Performed at [facility], [place] [address], [place], [place] [address]    Report Status PENDING  Incomplete  Blood Culture  (routine x 2)     Status: None (Preliminary result)   Collection Time: 10/04/19  1:50 PM   Specimen: BLOOD  Result Value Ref Range Status   Specimen Description BLOOD LEFT PICC LINE  Final   Special Requests   Final    BOTTLES DRAWN AEROBIC AND ANAEROBIC Blood Culture adequate volume   Culture   Final    NO GROWTH < 24 HOURS Performed at [facility], [place] [address] [place], [place] [address]    Report Status PENDING  Incomplete   RN Pressure Injury Documentation:     Estimated body mass index is 49.09 kg/m as calculated from the following:   Height as of this encounter: $RemoveBeforeD'5\' 5"'[id number]$  (1.651 m).   Weight as of this encounter: 133.8 kg.  Malnutrition Type:      Malnutrition Characteristics:      Nutrition Interventions:    Radiology Studies: DG Chest Port 1 View  Result Date: 10/04/2019 CLINICAL DATA:  Shortness of breath. EXAM: PORTABLE CHEST 1 VIEW COMPARISON:  Same day. FINDINGS: The heart size and mediastinal contours are within normal limits. No pneumothorax or pleural effusion is noted. Continued presence of multiple opacity seen throughout both lungs concerning for multifocal pneumonia. The visualized skeletal structures are unremarkable. IMPRESSION: Continued presence of multiple bilateral lung opacities concerning for multifocal pneumonia. Electronically Signed   By: Marijo Conception M.D.   On: 10/04/2019 11:24   Scheduled Meds:  albuterol  2 puff Inhalation Q6H   vitamin C  500 mg Oral Daily   baricitinib  4 mg Oral Daily   enoxaparin (LOVENOX) injection  65 mg Subcutaneous Q24H   levothyroxine  200 mcg Oral QAC breakfast   methylPREDNISolone (SOLU-MEDROL) injection  80 mg Intravenous Q12H   sodium chloride flush  10-40 mL Intracatheter Q12H   zinc sulfate  220 mg Oral Daily   Continuous Infusions:  remdesivir 100 mg in NS 100 mL Stopped (10/05/19 1112)    LOS: 2 days   Kerney Elbe, DO [facility] PAGER is on [place]  If 7PM-7AM,  please contact night-coverage [email or web address]

## 2019-10-06 NOTE — Progress Notes (Signed)
   10/06/19 1150  Clinical Encounter Type  Visited With Patient  Visit Type Initial  Referral From Nurse  Consult/Referral To [place] responded to consult for prayer. Pt requested the Lord's Prayer. Chaplains will follow for daily prayer.   Chaplain Resident, Evelene Croon, M Div [phone] on-call pager

## 2019-10-07 ENCOUNTER — Inpatient Hospital Stay (HOSPITAL_COMMUNITY): Payer: BC Managed Care – PPO

## 2019-10-07 LAB — COMPREHENSIVE METABOLIC PANEL
ALT: 28 U/L (ref 0–44)
AST: 26 U/L (ref 15–41)
Albumin: 3.4 g/dL — ABNORMAL LOW (ref 3.5–5.0)
Alkaline Phosphatase: 36 U/L — ABNORMAL LOW (ref 38–126)
Anion gap: 13 (ref 5–15)
BUN: 17 mg/dL (ref 6–20)
CO2: 26 mmol/L (ref 22–32)
Calcium: 8.7 mg/dL — ABNORMAL LOW (ref 8.9–10.3)
Chloride: 103 mmol/L (ref 98–111)
Creatinine, Ser: 0.9 mg/dL (ref 0.44–1.00)
GFR calc Af Amer: >60 mL/min (ref >60)
GFR calc non Af Amer: >60 mL/min (ref >60)
Glucose, Bld: 152 mg/dL — ABNORMAL HIGH (ref 70–99)
Potassium: 3.8 mmol/L (ref 3.5–5.1)
Sodium: 142 mmol/L (ref 135–145)
Total Bilirubin: 0.9 mg/dL (ref 0.3–1.2)
Total Protein: 7.2 g/dL (ref 6.5–8.1)

## 2019-10-07 LAB — CBC WITH DIFFERENTIAL/PLATELET
Abs Immature Granulocytes: 0.38 10*3/uL — ABNORMAL HIGH (ref 0.00–0.07)
Basophils Absolute: 0 10*3/uL (ref 0.0–0.1)
Basophils Relative: 0 %
Eosinophils Absolute: 0 10*3/uL (ref 0.0–0.5)
Eosinophils Relative: 0 %
HCT: 41.1 % (ref 36.0–46.0)
Hemoglobin: 13.7 g/dL (ref 12.0–15.0)
Immature Granulocytes: 5 %
Lymphocytes Relative: 13 %
Lymphs Abs: 1 10*3/uL (ref 0.7–4.0)
MCH: 28 pg (ref 26.0–34.0)
MCHC: 33.3 g/dL (ref 30.0–36.0)
MCV: 84 fL (ref 80.0–100.0)
Monocytes Absolute: 0.9 10*3/uL (ref 0.1–1.0)
Monocytes Relative: 13 %
Neutro Abs: 5.2 10*3/uL (ref 1.7–7.7)
Neutrophils Relative %: 69 %
Platelets: 327 10*3/uL (ref 150–400)
RBC: 4.89 MIL/uL (ref 3.87–5.11)
RDW: 14.2 % (ref 11.5–15.5)
WBC: 7.5 10*3/uL (ref 4.0–10.5)
nRBC: 0 % (ref 0.0–0.2)

## 2019-10-07 LAB — FERRITIN: Ferritin: 308 ng/mL — ABNORMAL HIGH (ref 11–307)

## 2019-10-07 LAB — BRAIN NATRIURETIC PEPTIDE: B Natriuretic Peptide: 13.3 pg/mL (ref 0.0–100.0)

## 2019-10-07 LAB — C-REACTIVE PROTEIN: CRP: 1.5 mg/dL — ABNORMAL HIGH (ref <1.0)

## 2019-10-07 LAB — SEDIMENTATION RATE: Sed Rate: 40 mm/hr — ABNORMAL HIGH (ref 0–22)

## 2019-10-07 LAB — PHOSPHORUS: Phosphorus: 3.9 mg/dL (ref 2.5–4.6)

## 2019-10-07 LAB — FIBRINOGEN: Fibrinogen: 561 mg/dL — ABNORMAL HIGH (ref 210–475)

## 2019-10-07 LAB — T4, FREE: Free T4: 1.12 ng/dL (ref 0.61–1.12)

## 2019-10-07 LAB — D-DIMER, QUANTITATIVE: D-Dimer, Quant: 1.31 ug/mL-FEU — ABNORMAL HIGH (ref 0.00–0.50)

## 2019-10-07 LAB — MAGNESIUM: Magnesium: 2.8 mg/dL — ABNORMAL HIGH (ref 1.7–2.4)

## 2019-10-07 LAB — LACTATE DEHYDROGENASE: LDH: 462 U/L — ABNORMAL HIGH (ref 98–192)

## 2019-10-07 MED ORDER — CALCIUM CARBONATE ANTACID 500 MG PO CHEW
1.0000 | CHEWABLE_TABLET | Freq: Two times a day (BID) | ORAL | Status: DC
  Administered 2019-10-07 – 2019-10-15 (×17): 200 mg via ORAL
  Filled 2019-10-07 (×16): qty 1

## 2019-10-07 MED ORDER — MENTHOL 3 MG MT LOZG
1.0000 | LOZENGE | OROMUCOSAL | Status: DC | PRN
  Filled 2019-10-07: qty 9

## 2019-10-07 NOTE — Progress Notes (Signed)
PROGRESS NOTE    Sarah Calhoun  IRW:[id number] DOB: 1995-07-15 DOA: 10/04/2019 PCP: Loraine Leriche., MD   Brief Narrative:  Sarah Calhoun a 24 y.o.femalewith medical history significant ofhypothyroidism, morbid obesity with BMI of 49 who presented to emergency department with worsening shortness of breath, body ache, fever, chills, generalized weakness and lethargy since 1 week, she became more Calhoun of breath came to the hospital and was diagnosed with severe acute hypoxic respiratory failure due to COVID-19 pneumonia and admitted to the hospital.  Currently she is in the progressive care unit requiring heated high flow supplemental oxygen via nasal cannula and has received steroids, remdesivir and off label Baricitinib.   She has been stable on her oxygen however she is complaining of sore throat now.  Assessment & Plan:   Principal Problem:   Acute hypoxemic respiratory failure due to COVID-19 [facility]) Active Problems:   Sepsis (Iron)   Hypothyroidism  Severe Viral Sepsis, poA Acute Respiratory Failure with Hypoxia 2/2 COVID-19 Pneumonia -SARS-CoV-2 PCR positive 09/30/19 -She has severe parenchymal lung injury and has been started on IV steroids high-dose along with Remdesivir and Baricitinib.  -Patient met criteria for severe sepsis on admission given SIRS criteria of a respiratory rate > 20 (24), HR >90 (113), and a Temperature of 101 with evidence of organ dysfunction given Acute Respiratory Failure with Hypoxia -Initial CXR showed "Central venous congestion.  No viral pneumonia identified" -CTA of the Chest showed "No evidence of pulmonary embolus. Multifocal bilateral nodular airspace disease, compatible with COVID-19 pneumonia -Repeat CXR 10/04/19 done and showed "The heart size and mediastinal contours are within normal limits. No pneumothorax or pleural effusion is noted. Continued presence of multiple opacity seen throughout both lungs concerning for multifocal pneumonia. The  visualized skeletal structures are Unremarkable." -Sepsis Physiology is improved but Respiratory Status has worsened and is tenuous -Inflammatory Marker Trend: Recent Labs    10/04/19 1201 10/04/19 1201 10/05/19 0248 10/06/19 0129 10/07/19 0500  DDIMER 2.41*   < > 2.00* 1.57* 1.31*  FERRITIN 346*  --  384*  --   --   LDH 540*  --   --   --  462*  CRP 9.2*  --  7.9* 4.1*  --    < > = values in this interval not displayed.  -TG was 169 -BNP went from 46.6 -> 13.8 -> 19.4 -Fibrinogen was 649 and is now 561 Lab Results  Component Value Date   SARSCOV2NAA POSITIVE (A) 09/30/2019   -PCT was 0.10 / 0.12 on Admission  -SpO2: 97 % O2 Flow Rate (L/min): 15 L/min FiO2 (%): 60 %; Continue Weaning as tolerated  -Continue Remdesivir x5 days 10/04/19 ->  -Steroids x10 days; Currently getting IV Solu-Medrol 80 mg every 12 for 10 days total  -Received Baricitinib Off Label and continuing to get it 4 mg po daily x 14 Days (Day 2/14) started 10/05/19 -> ; patient denies any known history of active diverticulitis, tuberculosis or hepatitis, understands the risks and benefits and wanted to proceed with Baricitinb treatment if required -CRP was grossly elevated at 9.2 -> 7.9 ->4.1 -> 1.5 -Continue Airborne, contact precautions for 21 days from positive testing (since patient is admitted for COVID) -Contine Monitor inflammatory markers and LFTs.  -Encourage OOB, IS, FV -C/w Acetaminophen 650 mg po q6prn for Fever  -Enoxaparin 65 mg q24h  -Blood Cx x2 showed NGTD at 2 Days  -Added Combivent 2 puff IH q6h and Changed Albuterol to 1-2 puff IH q4hprn -Continue to monitor  temperature curve and continue with acetaminophen for mild fever -Antitussives with p.o. Robitussin-DM and Tussionex; Will add Benzonatate 100 mg po TIDprn as well  -Continue with zinc sulfate 220 mg p.o. daily along with vitamin C 500 mg p.o. daily -Prone if able -Has a high risk for decompensation and respiratory status is  tenuous -Continue monitor respiratory status carefully and  wean O2 as tolerated  -Repeat chest x-ray 10/07/19 showed "Persistent bilateral patchy opacities concerning for multifocal Pneumonia." -Will need an ambulatory home O2 screen prior to discharge   Hypothyroidism. -Continue home dose Levothyroxine 200 mcg po Daily,  -TSH mildly elevated PCP to repeat in 4 to 6 weeks.   -This could be sick euthyroid.  Abnormal AST -Due to COVID viral infection  -Patient's AST went from 57 -> 51 -> 37 -> 26 -Continue to Monitor and Trend   Hyperglycemia -Closely monitor CBGs since patient is on steroids and expect Steroid Demargination -Check HbA1c int the AM: **Pending  -CBG's ranging from 113-178 on Daily CMP's  GERD -Having reflux in the setting of her steroids -Started her on pantoprazole 40 mg p.o. twice daily and added calcium carbonate twice daily  Morbid Obesity -Estimated body mass index is 49.09 kg/m as calculated from the following:   Height as of this encounter: '5\' 5"'  (1.651 m).   Weight as of this encounter: 133.8 kg.  -Continued Weight Loss and Dietary Counseling   Sore Throat -In the setting of reflux and coughing from Covid -Continue with antireflux medications, Chloraseptic spray as well as Cepacol lozenges -Continue to monitor for any worsening; she complains that she cannot talk now because of the sore throat and so points to pain under her chin and on her neck  DVT prophylaxis: Enoxaparin 65 mg q24h Code Status: FULL CODE  Family Communication: No family present at bedside  Disposition Plan: Pending further clinical Improvement and Weaning of Oxygen   Status is: Inpatient  Remains inpatient appropriate because:Unsafe d/c plan, IV treatments appropriate due to intensity of illness or inability to take PO and Inpatient level of care appropriate due to severity of illness   Dispo: The patient is from: Home              Anticipated d/c is to: Home               Anticipated d/c date is: > 3 days              Patient currently is not medically stable to d/c.  Consultants:   None   Procedures: None  Antimicrobials:  Anti-infectives (From admission, onward)   Start     Dose/Rate Route Frequency Ordered Stop   10/05/19 1000  remdesivir 100 mg in sodium chloride 0.9 % 100 mL IVPB       "Followed by" Linked Group Details   100 mg 200 mL/hr over 30 Minutes Intravenous Daily 10/04/19 1358 10/09/19 0959   10/04/19 1400  remdesivir 200 mg in sodium chloride 0.9% 250 mL IVPB       "Followed by" Linked Group Details   200 mg 580 mL/hr over 30 Minutes Intravenous Once 10/04/19 1358 10/04/19 1635     Subjective: Seen and examined at bedside and stated that it was difficult for her to call given her sore throat.  Denies any nausea or vomiting.  Thinks she is doing okay.  Had 1 bowel movement this morning.  No other concerns or complaints at this time.  Objective: Vitals:   10/06/19 1811  10/06/19 2106 10/06/19 2334 10/07/19 0322  BP:   114/71   Pulse:  92 72   Resp: 18  20   Temp:   98.3 F (36.8 C)   TempSrc:   Oral   SpO2:  92% 97% 97%  Weight:      Height:        Intake/Output Summary (Last 24 hours) at 10/07/2019 0756 Last data filed at 10/07/2019 0400 Gross per 24 hour  Intake 680 ml  Output --  Net 680 ml   Filed Weights   10/04/19 1029  Weight: 133.8 kg   Examination: Physical Exam:  Constitutional: WN/WD morbidly obese African-American female currently in no acute distress but sitting up in bed to the bed complaining of sore throat Eyes: Lids and conjunctivae normal, sclerae anicteric  ENMT: External Ears, Nose appear normal. Grossly normal hearing. Neck: Appears normal, supple, no cervical masses, normal ROM, no appreciable thyromegaly; no JVD Respiratory: Diminished to auscultation bilaterally with coarse breath sounds and some crackles but no appreciable wheezing, rales, rhonchi or crackles. Normal respiratory effort and  patient is not tachypenic. No accessory muscle use.  Cardiovascular: RRR, no murmurs / rubs / gallops. S1 and S2 auscultated. No extremity edema. 2+ pedal pulses. No carotid bruits.  Abdomen: Soft, non-tender, Distended 2/2 to body habitus. Bowel sounds positive.  GU: Deferred. Musculoskeletal: No clubbing / cyanosis of digits/nails. No joint deformity upper and lower extremities. Skin: No rashes, lesions, ulcers on a limited skin evaluation. No induration; Warm and dry.  Neurologic: CN 2-12 grossly intact with no focal deficits. Romberg sign and cerebellar reflexes not assessed.  Psychiatric: Normal judgment and insight. Alert and oriented x 3. Anxious mood and appropriate affect.   Data Reviewed: I have personally reviewed following labs and imaging studies  CBC: Recent Labs  Lab 09/30/19 2009 09/30/19 2009 10/02/19 1626 10/04/19 1201 10/05/19 0248 10/06/19 0129 10/07/19 0500  WBC 5.7   < > 9.8 4.4 3.6* 6.6 7.5  NEUTROABS 4.4  --   --  3.4 2.7 5.0 5.2  HGB 13.5   < > 13.3 14.0 13.5 14.4 13.7  HCT 40.2   < > 39.1 42.7 40.7 43.3 41.1  MCV 84.5   < > 83.4 87.9 86.8 85.6 84.0  PLT 202   < > 218 179 192 248 327   < > = values in this interval not displayed.   Basic Metabolic Panel: Recent Labs  Lab 10/02/19 1626 10/04/19 1201 10/05/19 0248 10/06/19 0129 10/07/19 0500  NA 136 140 141 142 142  K 3.6 3.7 3.8 4.0 3.8  CL 100 102 103 105 103  CO2 '24 26 25 28 26  ' GLUCOSE 106* 113* 146* 178* 152*  BUN '13 8 10 15 17  ' CREATININE 1.11* 1.04*  1.08* 0.86 0.93 0.90  CALCIUM 8.3* 8.3* 8.3* 8.5* 8.7*  MG  --   --  2.8* 2.8* 2.8*  PHOS  --   --  3.4  --  3.9   GFR: Estimated Creatinine Clearance: 134.6 mL/min (by C-G formula based on SCr of 0.9 mg/dL). Liver Function Tests: Recent Labs  Lab 10/04/19 1201 10/05/19 0248 10/06/19 0129 10/07/19 0500  AST 57* 51* 37 26  ALT 28 31 32 28  ALKPHOS 39 39 36* 36*  BILITOT 0.9 0.7 0.7 0.9  PROT 7.2 7.2 7.2 7.2  ALBUMIN 3.6 3.4* 3.4*  3.4*   No results for input(s): LIPASE, AMYLASE in the last 168 hours. No results for input(s): AMMONIA in the  last 168 hours. Coagulation Profile: No results for input(s): INR, PROTIME in the last 168 hours. Cardiac Enzymes: No results for input(s): CKTOTAL, CKMB, CKMBINDEX, TROPONINI in the last 168 hours. BNP (last 3 results) No results for input(s): PROBNP in the last 8760 hours. HbA1C: No results for input(s): HGBA1C in the last 72 hours. CBG: Recent Labs  Lab 10/06/19 1715  GLUCAP 126*   Lipid Profile: Recent Labs    10/04/19 1201  TRIG 169*   Thyroid Function Tests: Recent Labs    10/04/19 1201  TSH 8.396*   Anemia Panel: Recent Labs    10/04/19 1201 10/05/19 0248  FERRITIN 346* 384*   Sepsis Labs: Recent Labs  Lab 10/04/19 1201  PROCALCITON 0.10  0.12  LATICACIDVEN 1.0    Recent Results (from the past 240 hour(s))  SARS Coronavirus 2 by RT PCR (hospital order, performed in [place] hospital lab) Nasopharyngeal Nasopharyngeal Swab     Status: Abnormal   Collection Time: 09/30/19  5:00 PM   Specimen: Nasopharyngeal Swab  Result Value Ref Range Status   SARS Coronavirus 2 POSITIVE (A) NEGATIVE Final    Comment: RESULT CALLED TO, READ BACK BY AND VERIFIED WITH: MARVA SIMMS RN '@[id number]'  09/30/2019 OLSONM (NOTE) SARS-CoV-2 target nucleic acids are DETECTED  SARS-CoV-2 RNA is generally detectable in upper respiratory specimens  during the acute phase of infection.  Positive results are indicative  of the presence of the identified virus, but do not rule out bacterial infection or co-infection with other pathogens not detected by the test.  Clinical correlation with patient history and  other diagnostic information is necessary to determine patient infection status.  The expected result is negative.  Fact Sheet for Patients:   StrictlyIdeas.no   Fact Sheet for Healthcare Providers:    BankingDealers.co.za    This test is not yet approved or cleared by the [place] FDA and  has been authorized for detection and/or diagnosis of SARS-CoV-2 by FDA under an Emergency Use Authorization (EUA).  This EUA will remain in effect (meaning thi s test can be used) for the duration of  the COVID-19 declaration under Section 564(b)(1) of the Act, 21 U.S.C. section 360-bbb-3(b)(1), unless the authorization is terminated or revoked sooner.  Performed at [facility], [address] [place], [place] [address]   Blood Culture (routine x 2)     Status: None (Preliminary result)   Collection Time: 10/04/19 12:01 PM   Specimen: BLOOD RIGHT ARM  Result Value Ref Range Status   Specimen Description BLOOD RIGHT ARM  Final   Special Requests   Final    BOTTLES DRAWN AEROBIC AND ANAEROBIC Blood Culture adequate volume   Culture   Final    NO GROWTH 3 DAYS Performed at [facility], [address], [place], [place] [address]    Report Status PENDING  Incomplete  Blood Culture (routine x 2)     Status: None (Preliminary result)   Collection Time: 10/04/19  1:50 PM   Specimen: BLOOD  Result Value Ref Range Status   Specimen Description BLOOD LEFT PICC LINE  Final   Special Requests   Final    BOTTLES DRAWN AEROBIC AND ANAEROBIC Blood Culture adequate volume   Culture   Final    NO GROWTH 3 DAYS Performed at [facility], [place] [address] [place], [place] [address]    Report Status PENDING  Incomplete   RN Pressure Injury Documentation:     Estimated  body mass index is 49.09 kg/m as calculated from the following:   Height as of this encounter: '5\' 5"'  (1.651 m).   Weight as of this encounter: 133.8 kg.  Malnutrition Type:      Malnutrition Characteristics:      Nutrition Interventions:    Radiology Studies: DG CHEST PORT 1 VIEW  Result Date: 10/07/2019 CLINICAL DATA:  Follow-up pneumonia EXAM: PORTABLE CHEST 1 VIEW  COMPARISON:  10/04/2019 FINDINGS: Mild cardiac enlargement, stable. No pleural effusion identified. Unchanged appearance of bilateral patchy opacities within both lungs concerning for multifocal pneumonia. The visualized osseous structures are unremarkable. IMPRESSION: Persistent bilateral patchy opacities concerning for multifocal pneumonia. Electronically Signed   By: Kerby Moors M.D.   On: 10/07/2019 07:49   Scheduled Meds: . vitamin C  500 mg Oral Daily  . baricitinib  4 mg Oral Daily  . enoxaparin (LOVENOX) injection  65 mg Subcutaneous Q24H  . Ipratropium-Albuterol  1 puff Inhalation Q6H  . levothyroxine  200 mcg Oral QAC breakfast  . methylPREDNISolone (SOLU-MEDROL) injection  80 mg Intravenous Q12H  . pantoprazole  40 mg Oral BID  . sodium chloride flush  10-40 mL Intracatheter Q12H  . zinc sulfate  220 mg Oral Daily   Continuous Infusions: . remdesivir 100 mg in NS 100 mL 100 mg (10/06/19 0857)    LOS: 3 days   Kerney Elbe, DO [facility] PAGER is on AMION  If 7PM-7AM, please contact night-coverage [email or web address]

## 2019-10-07 NOTE — Progress Notes (Signed)
Secure chat sent to RN Aldona Bar, [facility] draws their own labs from midlines and central lines. If they are unable to get the blood from the lines then lab should be notified

## 2019-10-08 LAB — COMPREHENSIVE METABOLIC PANEL
ALT: 25 U/L (ref 0–44)
AST: 20 U/L (ref 15–41)
Albumin: 3.5 g/dL (ref 3.5–5.0)
Alkaline Phosphatase: 38 U/L (ref 38–126)
Anion gap: 11 (ref 5–15)
BUN: 16 mg/dL (ref 6–20)
CO2: 24 mmol/L (ref 22–32)
Calcium: 8.9 mg/dL (ref 8.9–10.3)
Chloride: 105 mmol/L (ref 98–111)
Creatinine, Ser: 0.97 mg/dL (ref 0.44–1.00)
GFR calc Af Amer: >60 mL/min (ref >60)
GFR calc non Af Amer: >60 mL/min (ref >60)
Glucose, Bld: 224 mg/dL — ABNORMAL HIGH (ref 70–99)
Potassium: 4 mmol/L (ref 3.5–5.1)
Sodium: 140 mmol/L (ref 135–145)
Total Bilirubin: 0.7 mg/dL (ref 0.3–1.2)
Total Protein: 6.9 g/dL (ref 6.5–8.1)

## 2019-10-08 LAB — MAGNESIUM: Magnesium: 2.7 mg/dL — ABNORMAL HIGH (ref 1.7–2.4)

## 2019-10-08 LAB — CBC WITH DIFFERENTIAL/PLATELET
Abs Immature Granulocytes: 0.5 K/uL — ABNORMAL HIGH (ref 0.00–0.07)
Basophils Absolute: 0 K/uL (ref 0.0–0.1)
Basophils Relative: 0 %
Eosinophils Absolute: 0 K/uL (ref 0.0–0.5)
Eosinophils Relative: 0 %
HCT: 42.1 % (ref 36.0–46.0)
Hemoglobin: 13.9 g/dL (ref 12.0–15.0)
Immature Granulocytes: 6 %
Lymphocytes Relative: 10 %
Lymphs Abs: 0.9 K/uL (ref 0.7–4.0)
MCH: 27.9 pg (ref 26.0–34.0)
MCHC: 33 g/dL (ref 30.0–36.0)
MCV: 84.4 fL (ref 80.0–100.0)
Monocytes Absolute: 0.7 K/uL (ref 0.1–1.0)
Monocytes Relative: 8 %
Neutro Abs: 6.6 K/uL (ref 1.7–7.7)
Neutrophils Relative %: 76 %
Platelets: 338 K/uL (ref 150–400)
RBC: 4.99 MIL/uL (ref 3.87–5.11)
RDW: 14.2 % (ref 11.5–15.5)
WBC: 8.7 K/uL (ref 4.0–10.5)
nRBC: 0 % (ref 0.0–0.2)

## 2019-10-08 LAB — BRAIN NATRIURETIC PEPTIDE: B Natriuretic Peptide: 22.8 pg/mL (ref 0.0–100.0)

## 2019-10-08 LAB — HEMOGLOBIN A1C
Hgb A1c MFr Bld: 6.6 % — ABNORMAL HIGH (ref 4.8–5.6)
Mean Plasma Glucose: 142.72 mg/dL

## 2019-10-08 LAB — D-DIMER, QUANTITATIVE: D-Dimer, Quant: 1.26 ug/mL-FEU — ABNORMAL HIGH (ref 0.00–0.50)

## 2019-10-08 LAB — SEDIMENTATION RATE: Sed Rate: 25 mm/hr — ABNORMAL HIGH (ref 0–22)

## 2019-10-08 LAB — LACTATE DEHYDROGENASE: LDH: 394 U/L — ABNORMAL HIGH (ref 98–192)

## 2019-10-08 LAB — FIBRINOGEN: Fibrinogen: 563 mg/dL — ABNORMAL HIGH (ref 210–475)

## 2019-10-08 LAB — FERRITIN: Ferritin: 270 ng/mL (ref 11–307)

## 2019-10-08 LAB — GLUCOSE, CAPILLARY: Glucose-Capillary: 224 mg/dL — ABNORMAL HIGH (ref 70–99)

## 2019-10-08 LAB — C-REACTIVE PROTEIN: CRP: 0.7 mg/dL (ref <1.0)

## 2019-10-08 LAB — T3: T3, Total: 49 ng/dL — ABNORMAL LOW (ref 71–180)

## 2019-10-08 MED ORDER — POLYETHYLENE GLYCOL 3350 17 G PO PACK
17.0000 g | PACK | Freq: Two times a day (BID) | ORAL | Status: DC
  Administered 2019-10-08 – 2019-10-15 (×7): 17 g via ORAL
  Filled 2019-10-08 (×13): qty 1

## 2019-10-08 MED ORDER — INSULIN ASPART 100 UNIT/ML ~~LOC~~ SOLN
0.0000 [IU] | Freq: Every day | SUBCUTANEOUS | Status: DC
  Administered 2019-10-08 – 2019-10-11 (×2): 2 [IU] via SUBCUTANEOUS
  Administered 2019-10-12: 3 [IU] via SUBCUTANEOUS
  Administered 2019-10-13: 2 [IU] via SUBCUTANEOUS

## 2019-10-08 MED ORDER — INSULIN ASPART 100 UNIT/ML ~~LOC~~ SOLN
0.0000 [IU] | Freq: Three times a day (TID) | SUBCUTANEOUS | Status: DC
  Administered 2019-10-08: 2 [IU] via SUBCUTANEOUS
  Administered 2019-10-09: 3 [IU] via SUBCUTANEOUS
  Administered 2019-10-09: 2 [IU] via SUBCUTANEOUS
  Administered 2019-10-10: 5 [IU] via SUBCUTANEOUS
  Administered 2019-10-10 – 2019-10-11 (×4): 3 [IU] via SUBCUTANEOUS
  Administered 2019-10-11 – 2019-10-12 (×3): 5 [IU] via SUBCUTANEOUS
  Administered 2019-10-12: 2 [IU] via SUBCUTANEOUS
  Administered 2019-10-13: 3 [IU] via SUBCUTANEOUS
  Administered 2019-10-13: 11 [IU] via SUBCUTANEOUS
  Administered 2019-10-13: 8 [IU] via SUBCUTANEOUS
  Administered 2019-10-14: 5 [IU] via SUBCUTANEOUS
  Administered 2019-10-14 – 2019-10-15 (×2): 3 [IU] via SUBCUTANEOUS

## 2019-10-08 MED ORDER — SENNOSIDES-DOCUSATE SODIUM 8.6-50 MG PO TABS
1.0000 | ORAL_TABLET | Freq: Two times a day (BID) | ORAL | Status: DC
  Administered 2019-10-08 – 2019-10-15 (×6): 1 via ORAL
  Filled 2019-10-08 (×13): qty 1

## 2019-10-08 NOTE — Progress Notes (Signed)
PROGRESS NOTE    Sarah Calhoun  TZG:[id number] DOB: 04/23/1995 DOA: 10/04/2019 PCP: Loraine Leriche., MD   Brief Narrative:  Sarah Calhoun a 24 y.o.femalewith medical history significant ofhypothyroidism, morbid obesity with BMI of 49 who presented to emergency department with worsening shortness of breath, body ache, fever, chills, generalized weakness and lethargy since 1 week, she became more Calhoun of breath came to the hospital and was diagnosed with severe acute hypoxic respiratory failure due to COVID-19 pneumonia and admitted to the hospital.  Currently she is in the progressive care unit requiring heated high flow supplemental oxygen via nasal cannula and has received steroids, remdesivir and off label Baricitinib.   She has been stable on her oxygen however she is complaining of sore throat now.  Assessment & Plan:   Principal Problem:   Acute hypoxemic respiratory failure due to COVID-19 [facility]) Active Problems:   Sepsis ([place])   Hypothyroidism  Severe Viral Sepsis, poA Acute Respiratory Failure with Hypoxia 2/2 COVID-19 Pneumonia -SARS-CoV-2 PCR positive 09/30/19 -She has severe parenchymal lung injury and has been started on IV steroids high-dose along with Remdesivir and Baricitinib.  -Patient met criteria for severe sepsis on admission given SIRS criteria of a respiratory rate > 20 (24), HR >90 (113), and a Temperature of 101 with evidence of organ dysfunction given Acute Respiratory Failure with Hypoxia -Initial CXR showed "Central venous congestion.  No viral pneumonia identified" -CTA of the Chest showed "No evidence of pulmonary embolus. Multifocal bilateral nodular airspace disease, compatible with COVID-19 pneumonia -Repeat CXR 10/04/19 done and showed "The heart size and mediastinal contours are within normal limits. No pneumothorax or pleural effusion is noted. Continued presence of multiple opacity seen throughout both lungs concerning for multifocal pneumonia. The  visualized skeletal structures are Unremarkable." -Sepsis Physiology is improved but Respiratory Status has worsened and is tenuous -Inflammatory Marker Trend: Recent Labs    10/06/19 0129 10/07/19 0500 10/07/19 0900 10/08/19 0852  DDIMER 1.57* 1.31*  --  1.26*  FERRITIN  --   --  308* 270  LDH  --  462*  --  394*  CRP 4.1*  --  1.5* 0.7  -TG was 169 -BNP went from 46.6 -> 13.8 -> 19.4 -> 22.8 -Fibrinogen was 649 -> 561 -> 563 -ESR was 40 Lab Results  Component Value Date   SARSCOV2NAA POSITIVE (A) 09/30/2019   -PCT was 0.10 / 0.12 on Admission  -SpO2: 90 % O2 Flow Rate (L/min): 20 L/min FiO2 (%): (S) 50 %; Continue Weaning as tolerated and is improving  -Continue Remdesivir x5 days 10/04/19 ->  -Steroids x10 days; Currently getting IV Solu-Medrol 80 mg every 12 for 10 days total  -Received Baricitinib Off Label and continuing to get it 4 mg po daily x 14 Days (Day 4/14) started 10/05/19 -> ; patient denies any known history of active diverticulitis, tuberculosis or hepatitis, understands the risks and benefits and wanted to proceed with Baricitinb treatment if required -CRP was grossly elevated at 9.2 -> 7.9 ->4.1 -> 1.5 -> 0.7 -Continue Airborne, contact precautions for 21 days from positive testing (since patient is admitted for COVID) -Contine Monitor inflammatory markers and LFTs.  -Encourage OOB, IS, FV -C/w Acetaminophen 650 mg po q6prn for Fever  -Enoxaparin 65 mg q24h  -Blood Cx x2 showed NGTD at 2 Days  -Added Combivent 2 puff IH q6h and Changed Albuterol to 1-2 puff IH q4hprn -Continue to monitor temperature curve and continue with acetaminophen for mild fever -Antitussives with p.o. Robitussin-DM  and Tussionex; Will add Benzonatate 100 mg po TIDprn as well  -Continue with zinc sulfate 220 mg p.o. daily along with vitamin C 500 mg p.o. daily -Prone if able -Has a high risk for decompensation and respiratory status is tenuous -Continue monitor respiratory status  carefully and  wean O2 as tolerated  -Repeat chest x-ray 10/07/19 showed "Persistent bilateral patchy opacities concerning for multifocal Pneumonia." -Will need an ambulatory home O2 screen prior to discharge   Hypothyroidism. -Continue home dose Levothyroxine 200 mcg po Daily,   -TSH mildly elevated at 8.396; Free T4 was normal at 1.12 and T3 was Low at 49 -PCP to repeat in 4 to 6 weeks and may need to adjust dose -This could be sick euthyroid.  Abnormal AST -Due to COVID viral infection  -Patient's AST went from 57 -> 51 -> 37 -> 26 -> 25 -Continue to Monitor and Trend   Hyperglycemia in the setting of New Onset Diabetes Mellitus Type -Closely monitor CBGs since patient is on steroids and expect Steroid Demargination -Check HbA1c is 6.6 -CBG's ranging from 113-224 on Daily CMP's -Will add Moderate Novolog SSI AC and HS  GERD -Having reflux in the setting of her steroids -Started her on pantoprazole 40 mg p.o. twice daily and added calcium carbonate twice daily  Morbid Obesity -Estimated body mass index is 49.09 kg/m as calculated from the following:   Height as of this encounter: '5\' 5"'  (1.651 m).   Weight as of this encounter: 133.8 kg.  -Continued Weight Loss and Dietary Counseling   Sore Throat, improving  -In the setting of reflux and coughing from Covid -Continue with antireflux medications, Chloraseptic spray as well as Cepacol lozenges -Continue to monitor for any worsening; she complains that she cannot talk now because of the sore throat and so points to pain under her chin and on her neck  DVT prophylaxis: Enoxaparin 65 mg q24h Code Status: FULL CODE  Family Communication: No family present at bedside  Disposition Plan: Pending further clinical Improvement and Weaning of Oxygen   Status is: Inpatient  Remains inpatient appropriate because:Unsafe d/c plan, IV treatments appropriate due to intensity of illness or inability to take PO and Inpatient level of care  appropriate due to severity of illness   Dispo: The patient is from: Home              Anticipated d/c is to: Home              Anticipated d/c date is: > 3 days              Patient currently is not medically stable to d/c.  Consultants:   None   Procedures: None  Antimicrobials:  Anti-infectives (From admission, onward)   Start     Dose/Rate Route Frequency Ordered Stop   10/05/19 1000  remdesivir 100 mg in sodium chloride 0.9 % 100 mL IVPB       "Followed by" Linked Group Details   100 mg 200 mL/hr over 30 Minutes Intravenous Daily 10/04/19 1358 10/08/19 0846   10/04/19 1400  remdesivir 200 mg in sodium chloride 0.9% 250 mL IVPB       "Followed by" Linked Group Details   200 mg 580 mL/hr over 30 Minutes Intravenous Once 10/04/19 1358 10/04/19 1635     Subjective: Seen and examined at bedside and she is doing ok and feels better today. Complains of some pain on neck and feels better with pressure. No lightheadedness or dizziness.  States that she has not had a bowel movement in a few days now.  No other concerns or complaints at this time.  Objective: Vitals:   10/08/19 0738 10/08/19 0811 10/08/19 1200 10/08/19 1525  BP: (!) 135/94     Pulse: 88 89 95 90  Resp: 16     Temp: 98.1 F (36.7 C)     TempSrc:      SpO2: 93% 90% 90% 90%  Weight:      Height:        Intake/Output Summary (Last 24 hours) at 10/08/2019 1541 Last data filed at 10/08/2019 7654 Gross per 24 hour  Intake 3 ml  Output --  Net 3 ml   Filed Weights   10/04/19 1029  Weight: 133.8 kg   Examination: Physical Exam:  Constitutional: WN/WD morbidly obese African-American female currently no acute distress still requiring heated high flow nasal cannula but her oxygen requirements are weaning. Eyes: Lids and conjunctivae normal, sclerae anicteric  ENMT: External Ears, Nose appear normal. Grossly normal hearing. Neck: Appears normal, supple, no cervical masses or lymphadenopathy appreciated,  normal ROM, no appreciable thyromegaly; states that her neck feels better after pressure was applied Respiratory: Diminished to auscultation bilaterally with coarse breath sounds and some slight crackles but no appreciable wheezing, rales, rhonchi.  Has a normal respiratory effort and she sitting up at the edge of the bed and has unlabored breathing but remains on heated high flow nasal cannula Cardiovascular: RRR, no murmurs / rubs / gallops. S1 and S2 auscultated. No extremity edema.  Abdomen: Soft, non-tender, distended secondary by habitus.  Bowel sounds positive.  GU: Deferred. Musculoskeletal: No clubbing / cyanosis of digits/nails. No joint deformity upper and lower extremities.  Skin: No rashes, lesions, ulcers on limited skin evaluation. No induration; Warm and dry.  Neurologic: CN 2-12 grossly intact with no focal deficits. Romberg sign and cerebellar reflexes not assessed.  Psychiatric: Normal judgment and insight. Alert and oriented x 3. Normal mood and appropriate affect.   Data Reviewed: I have personally reviewed following labs and imaging studies  CBC: Recent Labs  Lab 10/04/19 1201 10/05/19 0248 10/06/19 0129 10/07/19 0500 10/08/19 0852  WBC 4.4 3.6* 6.6 7.5 8.7  NEUTROABS 3.4 2.7 5.0 5.2 6.6  HGB 14.0 13.5 14.4 13.7 13.9  HCT 42.7 40.7 43.3 41.1 42.1  MCV 87.9 86.8 85.6 84.0 84.4  PLT 179 192 248 327 650   Basic Metabolic Panel: Recent Labs  Lab 10/04/19 1201 10/05/19 0248 10/06/19 0129 10/07/19 0500 10/08/19 0852  NA 140 141 142 142 140  K 3.7 3.8 4.0 3.8 4.0  CL 102 103 105 103 105  CO2 '26 25 28 26 24  ' GLUCOSE 113* 146* 178* 152* 224*  BUN '8 10 15 17 16  ' CREATININE 1.04*  1.08* 0.86 0.93 0.90 0.97  CALCIUM 8.3* 8.3* 8.5* 8.7* 8.9  MG  --  2.8* 2.8* 2.8* 2.7*  PHOS  --  3.4  --  3.9  --    GFR: Estimated Creatinine Clearance: 124.9 mL/min (by C-G formula based on SCr of 0.97 mg/dL). Liver Function Tests: Recent Labs  Lab 10/04/19 1201  10/05/19 0248 10/06/19 0129 10/07/19 0500 10/08/19 0852  AST 57* 51* 37 26 20  ALT 28 31 32 28 25  ALKPHOS 39 39 36* 36* 38  BILITOT 0.9 0.7 0.7 0.9 0.7  PROT 7.2 7.2 7.2 7.2 6.9  ALBUMIN 3.6 3.4* 3.4* 3.4* 3.5   No results for input(s): LIPASE, AMYLASE in the  last 168 hours. No results for input(s): AMMONIA in the last 168 hours. Coagulation Profile: No results for input(s): INR, PROTIME in the last 168 hours. Cardiac Enzymes: No results for input(s): CKTOTAL, CKMB, CKMBINDEX, TROPONINI in the last 168 hours. BNP (last 3 results) No results for input(s): PROBNP in the last 8760 hours. HbA1C: Recent Labs    10/08/19 0852  HGBA1C 6.6*   CBG: Recent Labs  Lab 10/06/19 1715  GLUCAP 126*   Lipid Profile: No results for input(s): CHOL, HDL, LDLCALC, TRIG, CHOLHDL, LDLDIRECT in the last 72 hours. Thyroid Function Tests: Recent Labs    10/07/19 0900  FREET4 1.12   Anemia Panel: Recent Labs    10/07/19 0900 10/08/19 0852  FERRITIN 308* 270   Sepsis Labs: Recent Labs  Lab 10/04/19 1201  PROCALCITON 0.10  0.12  LATICACIDVEN 1.0    Recent Results (from the past 240 hour(s))  SARS Coronavirus 2 by RT PCR (hospital order, performed in [facility] hospital lab) Nasopharyngeal Nasopharyngeal Swab     Status: Abnormal   Collection Time: 09/30/19  5:00 PM   Specimen: Nasopharyngeal Swab  Result Value Ref Range Status   SARS Coronavirus 2 POSITIVE (A) NEGATIVE Final    Comment: RESULT CALLED TO, READ BACK BY AND VERIFIED WITH: MARVA SIMMS RN '@[id number]'  09/30/2019 OLSONM (NOTE) SARS-CoV-2 target nucleic acids are DETECTED  SARS-CoV-2 RNA is generally detectable in upper respiratory specimens  during the acute phase of infection.  Positive results are indicative  of the presence of the identified virus, but do not rule out bacterial infection or co-infection with other pathogens not detected by the test.  Clinical correlation with patient history and  other diagnostic  information is necessary to determine patient infection status.  The expected result is negative.  Fact Sheet for Patients:   StrictlyIdeas.no   Fact Sheet for Healthcare Providers:   BankingDealers.co.za    This test is not yet approved or cleared by the [place] FDA and  has been authorized for detection and/or diagnosis of SARS-CoV-2 by FDA under an Emergency Use Authorization (EUA).  This EUA will remain in effect (meaning thi s test can be used) for the duration of  the COVID-19 declaration under Section 564(b)(1) of the Act, 21 U.S.C. section 360-bbb-3(b)(1), unless the authorization is terminated or revoked sooner.  Performed at [facility], [address] [place], [place] [address]   Blood Culture (routine x 2)     Status: None (Preliminary result)   Collection Time: 10/04/19 12:01 PM   Specimen: BLOOD RIGHT ARM  Result Value Ref Range Status   Specimen Description BLOOD RIGHT ARM  Final   Special Requests   Final    BOTTLES DRAWN AEROBIC AND ANAEROBIC Blood Culture adequate volume   Culture   Final    NO GROWTH 4 DAYS Performed at [facility], [place] [address], [place], [place] [address]    Report Status PENDING  Incomplete  Blood Culture (routine x 2)     Status: None (Preliminary result)   Collection Time: 10/04/19  1:50 PM   Specimen: BLOOD  Result Value Ref Range Status   Specimen Description BLOOD LEFT PICC LINE  Final   Special Requests   Final    BOTTLES DRAWN AEROBIC AND ANAEROBIC Blood Culture adequate volume   Culture   Final    NO GROWTH 4 DAYS Performed at [facility], [place] [address], [place], [place] [address]    Report  Status PENDING  Incomplete   RN Pressure Injury Documentation:     Estimated body mass index is 49.09 kg/m as calculated from the following:   Height as of this encounter: '5\' 5"'  (1.651 m).   Weight as of this encounter: 133.8 kg.  Malnutrition  Type:      Malnutrition Characteristics:      Nutrition Interventions:    Radiology Studies: DG CHEST PORT 1 VIEW  Result Date: 10/07/2019 CLINICAL DATA:  Follow-up pneumonia EXAM: PORTABLE CHEST 1 VIEW COMPARISON:  10/04/2019 FINDINGS: Mild cardiac enlargement, stable. No pleural effusion identified. Unchanged appearance of bilateral patchy opacities within both lungs concerning for multifocal pneumonia. The visualized osseous structures are unremarkable. IMPRESSION: Persistent bilateral patchy opacities concerning for multifocal pneumonia. Electronically Signed   By: Kerby Moors M.D.   On: 10/07/2019 07:49   Scheduled Meds: . vitamin C  500 mg Oral Daily  . baricitinib  4 mg Oral Daily  . calcium carbonate  1 tablet Oral BID  . enoxaparin (LOVENOX) injection  65 mg Subcutaneous Q24H  . Ipratropium-Albuterol  1 puff Inhalation Q6H  . levothyroxine  200 mcg Oral QAC breakfast  . methylPREDNISolone (SOLU-MEDROL) injection  80 mg Intravenous Q12H  . pantoprazole  40 mg Oral BID  . sodium chloride flush  10-40 mL Intracatheter Q12H  . zinc sulfate  220 mg Oral Daily   Continuous Infusions:   LOS: 4 days   Kerney Elbe, DO [facility] PAGER is on AMION  If 7PM-7AM, please contact night-coverage [email or web address]

## 2019-10-09 ENCOUNTER — Inpatient Hospital Stay (HOSPITAL_COMMUNITY): Payer: BC Managed Care – PPO

## 2019-10-09 DIAGNOSIS — M542 Cervicalgia: Secondary | ICD-10-CM

## 2019-10-09 LAB — PHOSPHORUS: Phosphorus: 4.1 mg/dL (ref 2.5–4.6)

## 2019-10-09 LAB — D-DIMER, QUANTITATIVE: D-Dimer, Quant: 1.34 ug/mL-FEU — ABNORMAL HIGH (ref 0.00–0.50)

## 2019-10-09 LAB — COMPREHENSIVE METABOLIC PANEL
ALT: 23 U/L (ref 0–44)
AST: 18 U/L (ref 15–41)
Albumin: 3.7 g/dL (ref 3.5–5.0)
Alkaline Phosphatase: 36 U/L — ABNORMAL LOW (ref 38–126)
Anion gap: 8 (ref 5–15)
BUN: 14 mg/dL (ref 6–20)
CO2: 27 mmol/L (ref 22–32)
Calcium: 9.2 mg/dL (ref 8.9–10.3)
Chloride: 104 mmol/L (ref 98–111)
Creatinine, Ser: 0.95 mg/dL (ref 0.44–1.00)
GFR calc Af Amer: >60 mL/min (ref >60)
GFR calc non Af Amer: >60 mL/min (ref >60)
Glucose, Bld: 183 mg/dL — ABNORMAL HIGH (ref 70–99)
Potassium: 4.4 mmol/L (ref 3.5–5.1)
Sodium: 139 mmol/L (ref 135–145)
Total Bilirubin: 1.1 mg/dL (ref 0.3–1.2)
Total Protein: 7.2 g/dL (ref 6.5–8.1)

## 2019-10-09 LAB — CBC WITH DIFFERENTIAL/PLATELET
Abs Immature Granulocytes: 0.67 10*3/uL — ABNORMAL HIGH (ref 0.00–0.07)
Basophils Absolute: 0.1 10*3/uL (ref 0.0–0.1)
Basophils Relative: 1 %
Eosinophils Absolute: 0 10*3/uL (ref 0.0–0.5)
Eosinophils Relative: 0 %
HCT: 43.9 % (ref 36.0–46.0)
Hemoglobin: 14.6 g/dL (ref 12.0–15.0)
Immature Granulocytes: 7 %
Lymphocytes Relative: 10 %
Lymphs Abs: 1 10*3/uL (ref 0.7–4.0)
MCH: 28.3 pg (ref 26.0–34.0)
MCHC: 33.3 g/dL (ref 30.0–36.0)
MCV: 85.1 fL (ref 80.0–100.0)
Monocytes Absolute: 0.7 10*3/uL (ref 0.1–1.0)
Monocytes Relative: 7 %
Neutro Abs: 7.5 10*3/uL (ref 1.7–7.7)
Neutrophils Relative %: 75 %
Platelets: 396 10*3/uL (ref 150–400)
RBC: 5.16 MIL/uL — ABNORMAL HIGH (ref 3.87–5.11)
RDW: 14 % (ref 11.5–15.5)
WBC: 10 10*3/uL (ref 4.0–10.5)
nRBC: 0 % (ref 0.0–0.2)

## 2019-10-09 LAB — SEDIMENTATION RATE: Sed Rate: 27 mm/hr — ABNORMAL HIGH (ref 0–22)

## 2019-10-09 LAB — CULTURE, BLOOD (ROUTINE X 2)
Culture: NO GROWTH
Culture: NO GROWTH
Special Requests: ADEQUATE
Special Requests: ADEQUATE

## 2019-10-09 LAB — GLUCOSE, CAPILLARY
Glucose-Capillary: 124 mg/dL — ABNORMAL HIGH (ref 70–99)
Glucose-Capillary: 177 mg/dL — ABNORMAL HIGH (ref 70–99)
Glucose-Capillary: 150 mg/dL — ABNORMAL HIGH (ref 70–99)
Glucose-Capillary: 192 mg/dL — ABNORMAL HIGH (ref 70–99)

## 2019-10-09 LAB — LACTATE DEHYDROGENASE: LDH: 408 U/L — ABNORMAL HIGH (ref 98–192)

## 2019-10-09 LAB — MAGNESIUM: Magnesium: 2.6 mg/dL — ABNORMAL HIGH (ref 1.7–2.4)

## 2019-10-09 LAB — FIBRINOGEN: Fibrinogen: 480 mg/dL — ABNORMAL HIGH (ref 210–475)

## 2019-10-09 LAB — C-REACTIVE PROTEIN: CRP: 0.7 mg/dL (ref <1.0)

## 2019-10-09 LAB — BRAIN NATRIURETIC PEPTIDE: B Natriuretic Peptide: 16.7 pg/mL (ref 0.0–100.0)

## 2019-10-09 LAB — FERRITIN: Ferritin: 299 ng/mL (ref 11–307)

## 2019-10-09 MED ORDER — IOHEXOL 300 MG/ML  SOLN
75.0000 mL | Freq: Once | INTRAMUSCULAR | Status: AC | PRN
  Administered 2019-10-09: 75 mL via INTRAVENOUS

## 2019-10-09 MED ORDER — TRAMADOL HCL 50 MG PO TABS
50.0000 mg | ORAL_TABLET | Freq: Four times a day (QID) | ORAL | Status: DC | PRN
  Administered 2019-10-13 – 2019-10-15 (×2): 50 mg via ORAL
  Filled 2019-10-09 (×3): qty 1

## 2019-10-09 NOTE — Progress Notes (Signed)
PROGRESS NOTE    Sarah Calhoun  ZOX:[id number] DOB: August 22, 1995 DOA: 10/04/2019 PCP: Loraine Leriche., MD   Brief Narrative:  Sarah Calhoun a 24 y.o.femalewith medical history significant ofhypothyroidism, morbid obesity with BMI of 49 who presented to emergency department with worsening shortness of breath, body ache, fever, chills, generalized weakness and lethargy since 1 week, she became more Calhoun of breath came to the hospital and was diagnosed with severe acute hypoxic respiratory failure due to COVID-19 pneumonia and admitted to the hospital.  Currently she is in the progressive care unit requiring heated high flow supplemental oxygen via nasal cannula and has received steroids, remdesivir and off label Baricitinib.   She has been stable on her oxygen however she is complaining of sore throat now.  10/09/19 Still complaining of neck pain worse than before so will obtain a CT Soft Tissue of the Neck w/ Contrast and add Tramadol for Pain. O2 requirements improving slowly but patient subjectively does not feel like she is. Has not had a bowel movement and states pain in the neck is worse when sh eats hard foods  Assessment & Plan:   Principal Problem:   Acute hypoxemic respiratory failure due to COVID-19 [facility]) Active Problems:   Sepsis ([place])   Hypothyroidism  Severe Viral Sepsis, poA Acute Respiratory Failure with Hypoxia 2/2 COVID-19 Pneumonia -SARS-CoV-2 PCR positive 09/30/19 -She has severe parenchymal lung injury and has been started on IV steroids high-dose along with Remdesivir and Baricitinib.  -Patient met criteria for severe sepsis on admission given SIRS criteria of a respiratory rate > 20 (24), HR >90 (113), and a Temperature of 101 with evidence of organ dysfunction given Acute Respiratory Failure with Hypoxia -Initial CXR showed "Central venous congestion.  No viral pneumonia identified" -CTA of the Chest showed "No evidence of pulmonary embolus. Multifocal  bilateral nodular airspace disease, compatible with COVID-19 pneumonia -Repeat CXR 10/04/19 done and showed "The heart size and mediastinal contours are within normal limits. No pneumothorax or pleural effusion is noted. Continued presence of multiple opacity seen throughout both lungs concerning for multifocal pneumonia. The visualized skeletal structures are Unremarkable." -Sepsis Physiology is improved but Respiratory Status has worsened and is tenuous -Inflammatory Marker Trend: Recent Labs    10/07/19 0500 10/07/19 0900 10/08/19 0852 10/09/19 0103  DDIMER 1.31*  --  1.26* 1.34*  FERRITIN  --  308* 270 299  LDH 462*  --  394* 408*  CRP  --  1.5* 0.7 0.7  -TG was 169 -BNP went from 46.6 -> 13.8 -> 19.4 -> 22.8 -Fibrinogen was 649 -> 561 -> 563 -> 480 -ESR was 40 -> 25 -> 27 Lab Results  Component Value Date   SARSCOV2NAA POSITIVE (A) 09/30/2019   -PCT was 0.10 / 0.12 on Admission  -SpO2: (!) 88 % O2 Flow Rate (L/min): 20 L/min FiO2 (%): 50 %; Continue Weaning as tolerated  -Completed Remdesivir x5 days 10/04/19 -> 10/08/19 -Steroids x10 days; Currently getting IV Solu-Medrol 80 mg every 12 for 10 days total  -Received Baricitinib Off Label and continuing to get it 4 mg po daily x 14 Days (Day 4/14) started 10/05/19 -> ; patient denies any known history of active diverticulitis, tuberculosis or hepatitis, understands the risks and benefits and wanted to proceed with Baricitinb treatment if required -CRP was grossly elevated at 9.2 but since has improved and gone to -> 7.9 ->4.1 -> 1.5 -> 0.7 x2  -Continue Airborne, contact precautions for 21 days from positive testing (since patient is  admitted for COVID) -Contine Monitor inflammatory markers and LFTs.  -Encourage OOB, IS, FV; Did not have a flutter valve until yesterday  -C/w Acetaminophen 650 mg po q6prn for Fever  -Enoxaparin 65 mg q24h  -Blood Cx x2 showed NGTD at 4 Days  -Added Combivent 2 puff IH q6h and Changed Albuterol  to 1-2 puff IH q4hprn -Continue to monitor temperature curve and continue with acetaminophen for mild fever -Antitussives with p.o. Robitussin-DM and Tussionex; Will add Benzonatate 100 mg po TIDprn as well  -Continue with zinc sulfate 220 mg p.o. daily along with vitamin C 500 mg p.o. daily -Prone if able -Has a high risk for decompensation and respiratory status is tenuous -Continue monitor respiratory status carefully and  wean O2 as tolerated  -Repeat chest x-ray 10/07/19 showed "Persistent bilateral patchy opacities concerning for multifocal Pneumonia."; Repeat CXR this AM is Pending  -Will need an ambulatory home O2 screen prior to discharge when her O2 requirement further weans    Hypothyroidism. -Continue home dose Levothyroxine 200 mcg po Daily,   -TSH mildly elevated at 8.396; Free T4 was normal at 1.12 and T3 was Low at 49 -PCP to repeat in 4 to 6 weeks and may need to adjust dose -This could be sick euthyroid.  Abnormal AST, improved  -Due to COVID viral infection  -Patient's AST went from 57 -> 51 -> 37 -> 26 -> 25 -> 18 -Continue to Monitor and Trend LFTs while getting Remdesivir   Hyperglycemia in the setting of New Onset Diabetes Mellitus Type 2 -Closely monitor CBGs since patient is on steroids and expect Steroid Demargination -Check HbA1c is 6.6 -Blood Sugars ranging from 113-224 on Daily CMP's; CBGs ranging from 124-224 and Blood Sugar this AM was 183 this AM on CMP -C/w Moderate Novolog SSI AC and HS and adjust Insulin as Necessary  GERD -Having reflux in the setting of her steroids -Started her on pantoprazole 40 mg p.o. twice daily and added calcium carbonate twice daily -May need Famotidine if not improving   Morbid Obesity -Estimated body mass index is 49.09 kg/m as calculated from the following:   Height as of this encounter: _0  (1.651 m).   Weight as of this encounter: 133.8 kg.  -Continued Weight Loss and Dietary Counseling   Sore Throat, Neck  Pain and mild odynophagia -Sore throat In the setting of reflux and coughing from Covid -Continue with antireflux medications, Chloraseptic spray as well as Cepacol lozenges -Continue to monitor for any worsening; she complained that she cannot talk now because of the sore throat and so points to pain under her chin and on her neck  -Neck pain is worse today so will obtain a CT Soft Tissue of the Neck with Contrast and start on Tramadol 50 mg q6hprn -Continue to Monitor closely and will consider a SLP evaluation   Constipation -Started Bowel Regimen -C/w Miralax 17 grams BID and Senna-Docusate 1 tab po BID  -Still has not had a bowel movement yet  DVT prophylaxis: Enoxaparin 65 mg q24h Code Status: FULL CODE  Family Communication: No family present at bedside  Disposition Plan: Pending further clinical Improvement and Weaning of Oxygen   Status is: Inpatient  Remains inpatient appropriate because:Unsafe d/c plan, IV treatments appropriate due to intensity of illness or inability to take PO and Inpatient level of care appropriate due to severity of illness   Dispo: The patient is from: Home  Anticipated d/c is to: Home              Anticipated d/c date is: > 3 days              Patient currently is not medically stable to d/c.  Consultants:   None   Procedures: None  Antimicrobials:  Anti-infectives (From admission, onward)   Start     Dose/Rate Route Frequency Ordered Stop   10/05/19 1000  remdesivir 100 mg in sodium chloride 0.9 % 100 mL IVPB       "Followed by" Linked Group Details   100 mg 200 mL/hr over 30 Minutes Intravenous Daily 10/04/19 1358 10/08/19 0846   10/04/19 1400  remdesivir 200 mg in sodium chloride 0.9% 250 mL IVPB       "Followed by" Linked Group Details   200 mg 580 mL/hr over 30 Minutes Intravenous Once 10/04/19 1358 10/04/19 1635     Subjective: Seen and examined at bedside and states her neck pain is worse than yesterday and continues  to worsen. States it hurts to eat "hard foods" so she has been trying to order a Soft diet. Pain improves when pressure is applied to the neck. Does not think her respiratory status is much improved. Still not had a bowel movement. No CP. No other concerns or complaints at this time.   Objective: Vitals:   10/09/19 0600 10/09/19 0758 10/09/19 0819 10/09/19 0827  BP:  (!) 146/93    Pulse:  93 94   Resp: 20 14    Temp:  99 F (37.2 C)    TempSrc:      SpO2:  90% (!) 87% (!) 88%  Weight:      Height:        Intake/Output Summary (Last 24 hours) at 10/09/2019 7530 Last data filed at 10/09/2019 0856 Gross per 24 hour  Intake 370 ml  Output --  Net 370 ml   Filed Weights   10/04/19 1029  Weight: 133.8 kg   Examination: Physical Exam:  Constitutional: WN/WD Morbidly obese AAF in NAD and appears calm but does appear uncomfortable complaining of neck pain  Eyes: Lids and conjunctivae normal, sclerae anicteric  ENMT: External Ears, Nose appear normal. Grossly normal hearing. Neck: Appears normal, supple, no cervical masses or lymphadenopathy appreciated, normal ROM, no appreciable thyromegaly; no JVD Respiratory: Diminished to auscultation bilaterally with coarse breath sounds and slight crackles but no wheezing, rales, rhonchi. Normal respiratory effort and patient is not tachypenic. No accessory muscle use. Wearing HHFNC 20 Liters today  Cardiovascular: RRR, no murmurs / rubs / gallops. S1 and S2 auscultated. Mild extremity edema Abdomen: Soft, non-tender, Distended due to body habitus.  Bowel sounds positive.  GU: Deferred. Musculoskeletal: No clubbing / cyanosis of digits/nails. No joint deformity upper and lower extremities..  Skin: No rashes, lesions, ulcers. No induration; Warm and dry.  Neurologic: CN 2-12 grossly intact with no focal deficits. Romberg sign and cerebellar reflexes not assessed.  Psychiatric: Normal judgment and insight. Alert and oriented x 3. Normal mood and  appropriate affect.   Data Reviewed: I have personally reviewed following labs and imaging studies  CBC: Recent Labs  Lab 10/05/19 0248 10/06/19 0129 10/07/19 0500 10/08/19 0852 10/09/19 0103  WBC 3.6* 6.6 7.5 8.7 10.0  NEUTROABS 2.7 5.0 5.2 6.6 7.5  HGB 13.5 14.4 13.7 13.9 14.6  HCT 40.7 43.3 41.1 42.1 43.9  MCV 86.8 85.6 84.0 84.4 85.1  PLT 192 248 327 338 396  Basic Metabolic Panel: Recent Labs  Lab 10/05/19 0248 10/06/19 0129 10/07/19 0500 10/08/19 0852 10/09/19 0103  NA 141 142 142 140 139  K 3.8 4.0 3.8 4.0 4.4  CL 103 105 103 105 104  CO2 _0 GLUCOSE 146* 178* 152* 224* 183*  BUN _1 CREATININE 0.86 0.93 0.90 0.97 0.95  CALCIUM 8.3* 8.5* 8.7* 8.9 9.2  MG 2.8* 2.8* 2.8* 2.7* 2.6*  PHOS 3.4  --  3.9  --  4.1   GFR: Estimated Creatinine Clearance: 127.5 mL/min (by C-G formula based on SCr of 0.95 mg/dL). Liver Function Tests: Recent Labs  Lab 10/05/19 0248 10/06/19 0129 10/07/19 0500 10/08/19 0852 10/09/19 0103  AST 51* 37 _2 ALT 31 32 _3 ALKPHOS 39 36* 36* 38 36*  BILITOT 0.7 0.7 0.9 0.7 1.1  PROT 7.2 7.2 7.2 6.9 7.2  ALBUMIN 3.4* 3.4* 3.4* 3.5 3.7   No results for input(s): LIPASE, AMYLASE in the last 168 hours. No results for input(s): AMMONIA in the last 168 hours. Coagulation Profile: No results for input(s): INR, PROTIME in the last 168 hours. Cardiac Enzymes: No results for input(s): CKTOTAL, CKMB, CKMBINDEX, TROPONINI in the last 168 hours. BNP (last 3 results) No results for input(s): PROBNP in the last 8760 hours. HbA1C: Recent Labs    10/08/19 0852  HGBA1C 6.6*   CBG: Recent Labs  Lab 10/06/19 1715 10/08/19 2117 10/09/19 0635  GLUCAP 126* 224* 124*   Lipid Profile: No results for input(s): CHOL, HDL, LDLCALC, TRIG, CHOLHDL, LDLDIRECT in the last 72 hours. Thyroid Function Tests: Recent Labs    10/07/19 0900  FREET4 1.12   Anemia Panel: Recent Labs    10/08/19 0852  10/09/19 0103  FERRITIN 270 299   Sepsis Labs: Recent Labs  Lab 10/04/19 1201  PROCALCITON 0.10  0.12  LATICACIDVEN 1.0    Recent Results (from the past 240 hour(s))  SARS Coronavirus 2 by RT PCR (hospital order, performed in [facility] hospital lab) Nasopharyngeal Nasopharyngeal Swab     Status: Abnormal   Collection Time: 09/30/19  5:00 PM   Specimen: Nasopharyngeal Swab  Result Value Ref Range Status   SARS Coronavirus 2 POSITIVE (A) NEGATIVE Final    Comment: RESULT CALLED TO, READ BACK BY AND VERIFIED WITH: MARVA SIMMS RN _4  09/30/2019 OLSONM (NOTE) SARS-CoV-2 target nucleic acids are DETECTED  SARS-CoV-2 RNA is generally detectable in upper respiratory specimens  during the acute phase of infection.  Positive results are indicative  of the presence of the identified virus, but do not rule out bacterial infection or co-infection with other pathogens not detected by the test.  Clinical correlation with patient history and  other diagnostic information is necessary to determine patient infection status.  The expected result is negative.  Fact Sheet for Patients:   StrictlyIdeas.no   Fact Sheet for Healthcare Providers:   BankingDealers.co.za    This test is not yet approved or cleared by the [place] FDA and  has been authorized for detection and/or diagnosis of SARS-CoV-2 by FDA under an Emergency Use Authorization (EUA).  This EUA will remain in effect (meaning thi s test can be used) for the duration of  the COVID-19 declaration under Section 564(b)(1) of the Act, 21 U.S.C. section 360-bbb-3(b)(1), unless the authorization is terminated or revoked sooner.  Performed at [facility], [address] [place], [place] [address]   Blood Culture (  routine x 2)     Status: None (Preliminary result)   Collection Time: 10/04/19 12:01 PM   Specimen: BLOOD RIGHT ARM  Result Value Ref Range Status    Specimen Description BLOOD RIGHT ARM  Final   Special Requests   Final    BOTTLES DRAWN AEROBIC AND ANAEROBIC Blood Culture adequate volume   Culture   Final    NO GROWTH 4 DAYS Performed at [facility], [address], [place], [place] [address]    Report Status PENDING  Incomplete  Blood Culture (routine x 2)     Status: None (Preliminary result)   Collection Time: 10/04/19  1:50 PM   Specimen: BLOOD  Result Value Ref Range Status   Specimen Description BLOOD LEFT PICC LINE  Final   Special Requests   Final    BOTTLES DRAWN AEROBIC AND ANAEROBIC Blood Culture adequate volume   Culture   Final    NO GROWTH 4 DAYS Performed at [facility], [place] [address] [place],  [address]    Report Status PENDING  Incomplete   RN Pressure Injury Documentation:     Estimated body mass index is 49.09 kg/m as calculated from the following:   Height as of this encounter: _0  (1.651 m).   Weight as of this encounter: 133.8 kg.  Malnutrition Type:      Malnutrition Characteristics:      Nutrition Interventions:    Radiology Studies: No results found. Scheduled Meds: . vitamin C  500 mg Oral Daily  . baricitinib  4 mg Oral Daily  . calcium carbonate  1 tablet Oral BID  . enoxaparin (LOVENOX) injection  65 mg Subcutaneous Q24H  . insulin aspart  0-15 Units Subcutaneous TID WC  . insulin aspart  0-5 Units Subcutaneous QHS  . Ipratropium-Albuterol  1 puff Inhalation Q6H  . levothyroxine  200 mcg Oral QAC breakfast  . methylPREDNISolone (SOLU-MEDROL) injection  80 mg Intravenous Q12H  . pantoprazole  40 mg Oral BID  . polyethylene glycol  17 g Oral BID  . senna-docusate  1 tablet Oral BID  . sodium chloride flush  10-40 mL Intracatheter Q12H  . zinc sulfate  220 mg Oral Daily   Continuous Infusions:   LOS: 5 days   Kerney Elbe, DO [facility] PAGER is on AMION  If 7PM-7AM, please contact night-coverage [email or web address]

## 2019-10-10 ENCOUNTER — Inpatient Hospital Stay (HOSPITAL_COMMUNITY): Payer: BC Managed Care – PPO

## 2019-10-10 DIAGNOSIS — J982 Interstitial emphysema: Secondary | ICD-10-CM

## 2019-10-10 LAB — CBC WITH DIFFERENTIAL/PLATELET
Abs Immature Granulocytes: 0.63 10*3/uL — ABNORMAL HIGH (ref 0.00–0.07)
Basophils Absolute: 0.1 10*3/uL (ref 0.0–0.1)
Basophils Relative: 0 %
Eosinophils Absolute: 0 10*3/uL (ref 0.0–0.5)
Eosinophils Relative: 0 %
HCT: 45.8 % (ref 36.0–46.0)
Hemoglobin: 14.9 g/dL (ref 12.0–15.0)
Immature Granulocytes: 4 %
Lymphocytes Relative: 6 %
Lymphs Abs: 0.9 10*3/uL (ref 0.7–4.0)
MCH: 27.6 pg (ref 26.0–34.0)
MCHC: 32.5 g/dL (ref 30.0–36.0)
MCV: 85 fL (ref 80.0–100.0)
Monocytes Absolute: 0.4 10*3/uL (ref 0.1–1.0)
Monocytes Relative: 2 %
Neutro Abs: 14.4 10*3/uL — ABNORMAL HIGH (ref 1.7–7.7)
Neutrophils Relative %: 88 %
Platelets: 431 10*3/uL — ABNORMAL HIGH (ref 150–400)
RBC: 5.39 MIL/uL — ABNORMAL HIGH (ref 3.87–5.11)
RDW: 14.3 % (ref 11.5–15.5)
WBC: 16.4 10*3/uL — ABNORMAL HIGH (ref 4.0–10.5)
nRBC: 0 % (ref 0.0–0.2)

## 2019-10-10 LAB — COMPREHENSIVE METABOLIC PANEL
ALT: 25 U/L (ref 0–44)
AST: 21 U/L (ref 15–41)
Albumin: 3.9 g/dL (ref 3.5–5.0)
Alkaline Phosphatase: 44 U/L (ref 38–126)
Anion gap: 11 (ref 5–15)
BUN: 15 mg/dL (ref 6–20)
CO2: 26 mmol/L (ref 22–32)
Calcium: 9.2 mg/dL (ref 8.9–10.3)
Chloride: 101 mmol/L (ref 98–111)
Creatinine, Ser: 0.98 mg/dL (ref 0.44–1.00)
GFR calc Af Amer: >60 mL/min (ref >60)
GFR calc non Af Amer: >60 mL/min (ref >60)
Glucose, Bld: 254 mg/dL — ABNORMAL HIGH (ref 70–99)
Potassium: 4.9 mmol/L (ref 3.5–5.1)
Sodium: 138 mmol/L (ref 135–145)
Total Bilirubin: 0.9 mg/dL (ref 0.3–1.2)
Total Protein: 7.6 g/dL (ref 6.5–8.1)

## 2019-10-10 LAB — FERRITIN: Ferritin: 316 ng/mL — ABNORMAL HIGH (ref 11–307)

## 2019-10-10 LAB — GLUCOSE, CAPILLARY
Glucose-Capillary: 221 mg/dL — ABNORMAL HIGH (ref 70–99)
Glucose-Capillary: 189 mg/dL — ABNORMAL HIGH (ref 70–99)
Glucose-Capillary: 197 mg/dL — ABNORMAL HIGH (ref 70–99)

## 2019-10-10 LAB — FIBRINOGEN: Fibrinogen: 475 mg/dL (ref 210–475)

## 2019-10-10 LAB — PHOSPHORUS: Phosphorus: 4.4 mg/dL (ref 2.5–4.6)

## 2019-10-10 LAB — SEDIMENTATION RATE: Sed Rate: 24 mm/hr — ABNORMAL HIGH (ref 0–22)

## 2019-10-10 LAB — LACTATE DEHYDROGENASE: LDH: 352 U/L — ABNORMAL HIGH (ref 98–192)

## 2019-10-10 LAB — MAGNESIUM: Magnesium: 2.7 mg/dL — ABNORMAL HIGH (ref 1.7–2.4)

## 2019-10-10 NOTE — Progress Notes (Addendum)
PROGRESS NOTE    Sarah Calhoun  KJZ:[id number] DOB: 23-Mar-1995 DOA: 10/04/2019 PCP: Loraine Leriche., MD   Brief Narrative:  Sarah Calhoun a 24 y.o.femalewith medical history significant ofhypothyroidism, morbid obesity with BMI of 49 who presented to emergency department with worsening shortness of breath, body ache, fever, chills, generalized weakness and lethargy since 1 week, she became more Calhoun of breath came to the hospital and was diagnosed with severe acute hypoxic respiratory failure due to COVID-19 pneumonia and admitted to the hospital.  Currently she is in the progressive care unit requiring heated high flow supplemental oxygen via nasal cannula and has received steroids, remdesivir and off label Baricitinib.   She has been stable on her oxygen however she is complaining of sore throat now.  10/09/19 Still complaining of neck pain worse than before so will obtain a CT Soft Tissue of the Neck w/ Contrast and add Tramadol for Pain. O2 requirements improving slowly but patient subjectively does not feel like she is. Has not had a bowel movement and states pain in the neck is worse when sh eats hard foods  10/10/19 CT Soft Tissue showed "Pneumomediastinum extending into the neck soft tissues." Case was discussed with Pulmonary who recommends to continue to observe carefully and follow serial CXR to watch for Pneumothorax. If she decompensates she likely has developed a Pneuothorax and will need a Chest Tube. Her O2 requirement continues to wean further and she is on 15 Liters HHFNC. She has been ambulating. Thinks the [place] is too much for her and yesterday when she went to CT she was placed on 15 L and a NRB and she thinks that was easier for her to breath that way so will attempt to wean to that.   Assessment & Plan:   Principal Problem:   Acute hypoxemic respiratory failure due to COVID-19 [facility]) Active Problems:   Sepsis ([place])   Hypothyroidism  Severe Viral Sepsis,  poA Acute Respiratory Failure with Hypoxia 2/2 COVID-19 Pneumonia -SARS-CoV-2 PCR positive 09/30/19 -She has severe parenchymal lung injury and has been started on IV steroids high-dose along with Remdesivir and Baricitinib.  -Patient met criteria for severe sepsis on admission given SIRS criteria of a respiratory rate > 20 (24), HR >90 (113), and a Temperature of 101 with evidence of organ dysfunction given Acute Respiratory Failure with Hypoxia -Initial CXR showed "Central venous congestion.  No viral pneumonia identified" -CTA of the Chest showed "No evidence of pulmonary embolus. Multifocal bilateral nodular airspace disease, compatible with COVID-19 pneumonia -Repeat CXR 10/04/19 done and showed "The heart size and mediastinal contours are within normal limits. No pneumothorax or pleural effusion is noted. Continued presence of multiple opacity seen throughout both lungs concerning for multifocal pneumonia. The visualized skeletal structures are Unremarkable." -Sepsis Physiology is improved but Respiratory Status has worsened and is tenuous -Inflammatory Marker Trend: Recent Labs    10/07/19 0900 10/07/19 0900 10/08/19 0852 10/09/19 0103 10/10/19 0339  DDIMER  --   --  1.26* 1.34*  --   FERRITIN 308*   < > 270 299 316*  LDH  --   --  394* 408* 352*  CRP 1.5*  --  0.7 0.7  --    < > = values in this interval not displayed.  -TG was 169 -BNP went from 46.6 -> 13.8 -> 19.4 -> 22.8 -Fibrinogen was 649 -> 561 -> 563 -> 480 -> 475 -ESR was 40 -> 25 -> 27 -> 24 Lab Results  Component Value Date  SARSCOV2NAA POSITIVE (A) 09/30/2019   -PCT was 0.10 / 0.12 on Admission  -SpO2: 91 % O2 Flow Rate (L/min): (S) 20 L/min FiO2 (%): (S) 60 %; Continue Weaning as tolerated and FIO2 is now weaning and is on 15 L HHFNC this AM; Will attempt to wean her to 15L via [place] + NRB today  -Completed Remdesivir x5 days 10/04/19 -> 10/08/19 -Steroids x10 days; Currently getting IV Solu-Medrol 80 mg every 12  for 10 days total  -Received Baricitinib Off Label and continuing to get it 4 mg po daily x 14 Days (Day 5/14 complete) started 10/05/19 -> ; patient denies any known history of active diverticulitis, tuberculosis or hepatitis, understands the risks and benefits and wanted to proceed with Baricitinb treatment if required -CRP was grossly elevated at 9.2 but since has improved and gone to -> 7.9 ->4.1 -> 1.5 -> 0.7 x2  -Continue Airborne, contact precautions for 21 days from positive testing (since patient is admitted for COVID) -Contine Monitor inflammatory markers and LFTs.  -Encourage OOB, IS, FV; Did not have a flutter valve until yesterday  -C/w Acetaminophen 650 mg po q6prn for Fever  -Enoxaparin 65 mg q24h  -Blood Cx x2 showed NGTD at 5 Days  -Added Combivent 2 puff IH q6h and Changed Albuterol to 1-2 puff IH q4hprn -Continue to monitor temperature curve and continue with acetaminophen for mild fever -Antitussives with p.o. Robitussin-DM and Tussionex; Will add Benzonatate 100 mg po TIDprn as well  -Continue with zinc sulfate 220 mg p.o. daily along with vitamin C 500 mg p.o. daily -C/w Prone if able -Has a high risk for decompensation and respiratory status is tenuous -Continue monitor respiratory status carefully and  wean O2 as tolerated  -Repeat chest x-ray 10/09/19 showed "Mild opacities in the lung bases concerning for multifocal pneumonia. No other acute abnormalities"; Of Note the CXR does not mention Pneumomediastinum. Repeat CXR this AM is Pending  -Will need an ambulatory home O2 screen prior to discharge when her O2 requirement further weans    Hypothyroidism. -Continue home dose Levothyroxine 200 mcg po Daily,   -TSH mildly elevated at 8.396; Free T4 was normal at 1.12 and T3 was Low at 49 -PCP to repeat in 4 to 6 weeks and may need to adjust dose -This could be sick euthyroid.  Leukocytosis -Likely Reactive and In the setting of Steroid Demargination -WBC went from 10.0  -> 16.4 -Continue to Monitor for S/S of Infection -Repeat CBC in the AM   Thrombocytosis -Likely Reactive -Patient's Platelet Count went from 396 -> 431 -Continue to Monitor and Trend -Repeat CBC  Abnormal AST, improved  -Due to COVID viral infection  -Patient's AST went from 57 -> 51 -> 37 -> 26 -> 25 -> 18 -> 21 -Continue to Monitor and Trend LFTs while getting Remdesivir   Hyperglycemia in the setting of New Onset Diabetes Mellitus Type 2 -Closely monitor CBGs since patient is on steroids and expect Steroid Demargination -Check HbA1c is 6.6 -Blood Sugars ranging from 113-224 on Daily CMP's; CBGs ranging from 124-224 and Blood Sugar this AM was 254 this AM on CMP -C/w Moderate Novolog SSI AC and HS and adjust Insulin as Necessary  GERD -Having reflux in the setting of her steroids -Started her on pantoprazole 40 mg p.o. twice daily and added calcium carbonate twice daily -May need Famotidine if not improving   Morbid Obesity -Estimated body mass index is 49.09 kg/m as calculated from the following:   Height as of  this encounter: _0  (1.651 m).   Weight as of this encounter: 133.8 kg.  -Continued Weight Loss and Dietary Counseling   Sore Throat, Neck Pain and mild odynophagia in the setting of Pneumomediastinum extending to the Neck -Sore throat initially thought the setting of reflux and coughing from Covid -Continue with antireflux medications, Chloraseptic spray as well as Cepacol lozenges -Continue to monitor for any worsening; she complained that she cannot talk now because of the sore throat and so points to pain under her chin and on her neck  -Neck pain was worse yesterday so will obtain a CT Soft Tissue of the Neck with Contrast and start on Tramadol 50 mg q6hprn -CT Soft Tissue of the Neck w/ Contrast showed "Pneumomediastinum extending into the neck soft tissues. In retrospect, this was probably present on 10/07/2019 chest radiograph." -Continue to Monitor  closely with serial CXRs   Constipation -Started Bowel Regimen -C/w Miralax 17 grams BID and Senna-Docusate 1 tab po BID  -Still has not had a bowel movement yet  DVT prophylaxis: Enoxaparin 65 mg q24h Code Status: FULL CODE  Family Communication: No family present at bedside  Disposition Plan: Pending further clinical Improvement and Weaning of Oxygen   Status is: Inpatient  Remains inpatient appropriate because:Unsafe d/c plan, IV treatments appropriate due to intensity of illness or inability to take PO and Inpatient level of care appropriate due to severity of illness   Dispo: The patient is from: Home              Anticipated d/c is to: Home              Anticipated d/c date is: > 3 days              Patient currently is not medically stable to d/c.  Consultants:   None   Procedures: None  Antimicrobials:  Anti-infectives (From admission, onward)   Start     Dose/Rate Route Frequency Ordered Stop   10/05/19 1000  remdesivir 100 mg in sodium chloride 0.9 % 100 mL IVPB       "Followed by" Linked Group Details   100 mg 200 mL/hr over 30 Minutes Intravenous Daily 10/04/19 1358 10/08/19 0846   10/04/19 1400  remdesivir 200 mg in sodium chloride 0.9% 250 mL IVPB       "Followed by" Linked Group Details   200 mg 580 mL/hr over 30 Minutes Intravenous Once 10/04/19 1358 10/04/19 1635     Subjective: Seen and examined at bedside and nursing states she has been up and ambulating and proning. Patient feels better. Asking not to be on a Soft diet any more. No CP. Thinks the [place] is causing her issues and wants to try to go to regular [place] + NRB as she was on it yesterday for CT Scan and thinks it was easier for to breathe. Still having neck pain but not as bad. No other concerns or complaints at this time.   Objective: Vitals:   10/09/19 2001 10/09/19 2004 10/09/19 2353 10/10/19 0306  BP: (!) 164/107  (!) 165/98 (!) 170/98  Pulse: (!) 117 (!) 116 96 92  Resp: (!) 24  (!) 22  20  Temp: 98.6 F (37 C)  98 F (36.7 C) 98 F (36.7 C)  TempSrc:   Oral Oral  SpO2: 93% 90% 96% 91%  Weight:      Height:        Intake/Output Summary (Last 24 hours) at 10/10/2019 0755 Last  data filed at 10/09/2019 0856 Gross per 24 hour  Intake 10 ml  Output --  Net 10 ml   Filed Weights   10/04/19 1029  Weight: 133.8 kg   Examination: Physical Exam:  Constitutional: WN/WD Morbidly obese AAF in NAD and appears calm and comfortable Eyes: Lids and conjunctivae normal, sclerae anicteric  ENMT: External Ears, Nose appear normal. Grossly normal hearing.  Neck: Appears normal, supple, no cervical masses, normal ROM, no appreciable thyromegaly; No JVD but mildly tender to palpate Respiratory: Diminished to auscultation bilaterally with coarse breath sounds and slight crackles ut no wheezing, rales, rhonchi or crackles. Normal respiratory effort and patient is not tachypenic. No accessory muscle use.Wearing 15L HHFNC currently  Cardiovascular: RRR, no murmurs / rubs / gallops. S1 and S2 auscultated. No extremity edema.  Abdomen: Soft, non-tender, Distended 2/2 body habitus. No masses palpated. No appreciable hepatosplenomegaly. Bowel sounds positive x4.  GU: Deferred. Musculoskeletal: No clubbing / cyanosis of digits/nails. No joint deformity upper and lower extremities.  Skin: No rashes, lesions, ulcers on a limited skin evaluation. No induration; Warm and dry.  Neurologic: CN 2-12 grossly intact with no focal deficits. Romberg sign and cerebellar reflexes not assessed.  Psychiatric: Normal judgment and insight. Alert and oriented x 3. Normal mood and appropriate affect.   Data Reviewed: I have personally reviewed following labs and imaging studies  CBC: Recent Labs  Lab 10/06/19 0129 10/07/19 0500 10/08/19 0852 10/09/19 0103 10/10/19 0339  WBC 6.6 7.5 8.7 10.0 16.4*  NEUTROABS 5.0 5.2 6.6 7.5 14.4*  HGB 14.4 13.7 13.9 14.6 14.9  HCT 43.3 41.1 42.1 43.9 45.8  MCV 85.6  84.0 84.4 85.1 85.0  PLT 248 327 338 396 335*   Basic Metabolic Panel: Recent Labs  Lab 10/05/19 0248 10/05/19 0248 10/06/19 0129 10/07/19 0500 10/08/19 0852 10/09/19 0103 10/10/19 0339  NA 141   < > 142 142 140 139 138  K 3.8   < > 4.0 3.8 4.0 4.4 4.9  CL 103   < > 105 103 105 104 101  CO2 25   < > _0 GLUCOSE 146*   < > 178* 152* 224* 183* 254*  BUN 10   < > _1 CREATININE 0.86   < > 0.93 0.90 0.97 0.95 0.98  CALCIUM 8.3*   < > 8.5* 8.7* 8.9 9.2 9.2  MG 2.8*   < > 2.8* 2.8* 2.7* 2.6* 2.7*  PHOS 3.4  --   --  3.9  --  4.1 4.4   < > = values in this interval not displayed.   GFR: Estimated Creatinine Clearance: 123.6 mL/min (by C-G formula based on SCr of 0.98 mg/dL). Liver Function Tests: Recent Labs  Lab 10/06/19 0129 10/07/19 0500 10/08/19 0852 10/09/19 0103 10/10/19 0339  AST 37 _2 ALT 32 _3 ALKPHOS 36* 36* 38 36* 44  BILITOT 0.7 0.9 0.7 1.1 0.9  PROT 7.2 7.2 6.9 7.2 7.6  ALBUMIN 3.4* 3.4* 3.5 3.7 3.9   No results for input(s): LIPASE, AMYLASE in the last 168 hours. No results for input(s): AMMONIA in the last 168 hours. Coagulation Profile: No results for input(s): INR, PROTIME in the last 168 hours. Cardiac Enzymes: No results for input(s): CKTOTAL, CKMB, CKMBINDEX, TROPONINI in the last 168 hours. BNP (last 3 results) No results for input(s): PROBNP in the last 8760 hours. HbA1C: Recent Labs    10/08/19 [phone]  HGBA1C 6.6*   CBG: Recent Labs  Lab 10/08/19 2117 10/09/19 0635 10/09/19 1303 10/09/19 1603 10/09/19 2136  GLUCAP 224* 124* 177* 150* 192*   Lipid Profile: No results for input(s): CHOL, HDL, LDLCALC, TRIG, CHOLHDL, LDLDIRECT in the last 72 hours. Thyroid Function Tests: Recent Labs    10/07/19 0900  FREET4 1.12   Anemia Panel: Recent Labs    10/09/19 0103 10/10/19 0339  FERRITIN 299 316*   Sepsis Labs: Recent Labs  Lab 10/04/19 1201  PROCALCITON 0.10  0.12  LATICACIDVEN 1.0     Recent Results (from the past 240 hour(s))  SARS Coronavirus 2 by RT PCR (hospital order, performed in [facility] hospital lab) Nasopharyngeal Nasopharyngeal Swab     Status: Abnormal   Collection Time: 09/30/19  5:00 PM   Specimen: Nasopharyngeal Swab  Result Value Ref Range Status   SARS Coronavirus 2 POSITIVE (A) NEGATIVE Final    Comment: RESULT CALLED TO, READ BACK BY AND VERIFIED WITH: MARVA SIMMS RN _0  09/30/2019 OLSONM (NOTE) SARS-CoV-2 target nucleic acids are DETECTED  SARS-CoV-2 RNA is generally detectable in upper respiratory specimens  during the acute phase of infection.  Positive results are indicative  of the presence of the identified virus, but do not rule out bacterial infection or co-infection with other pathogens not detected by the test.  Clinical correlation with patient history and  other diagnostic information is necessary to determine patient infection status.  The expected result is negative.  Fact Sheet for Patients:   StrictlyIdeas.no   Fact Sheet for Healthcare Providers:   BankingDealers.co.za    This test is not yet approved or cleared by the [place] FDA and  has been authorized for detection and/or diagnosis of SARS-CoV-2 by FDA under an Emergency Use Authorization (EUA).  This EUA will remain in effect (meaning thi s test can be used) for the duration of  the COVID-19 declaration under Section 564(b)(1) of the Act, 21 U.S.C. section 360-bbb-3(b)(1), unless the authorization is terminated or revoked sooner.  Performed at [facility], [address] [place], [place] [address]   Blood Culture (routine x 2)     Status: None   Collection Time: 10/04/19 12:01 PM   Specimen: BLOOD RIGHT ARM  Result Value Ref Range Status   Specimen Description BLOOD RIGHT ARM  Final   Special Requests   Final    BOTTLES DRAWN AEROBIC AND ANAEROBIC Blood Culture adequate volume   Culture    Final    NO GROWTH 5 DAYS Performed at [facility], [address] [place], [place] [address]    Report Status 10/09/2019 FINAL  Final  Blood Culture (routine x 2)     Status: None   Collection Time: 10/04/19  1:50 PM   Specimen: BLOOD  Result Value Ref Range Status   Specimen Description BLOOD LEFT PICC LINE  Final   Special Requests   Final    BOTTLES DRAWN AEROBIC AND ANAEROBIC Blood Culture adequate volume   Culture   Final    NO GROWTH 5 DAYS Performed at [facility], [place] [address] [place], [place] [address]    Report Status 10/09/2019 FINAL  Final   RN Pressure Injury Documentation:     Estimated body mass index is 49.09 kg/m as calculated from the following:   Height as of this encounter: _1  (1.651 m).   Weight as of this encounter: 133.8 kg.  Malnutrition Type:  Malnutrition Characteristics:      Nutrition Interventions:    Radiology Studies: CT SOFT TISSUE NECK W CONTRAST  Result Date: 10/09/2019 CLINICAL DATA:  Neck pain, history of thyroid disease EXAM: CT NECK WITH CONTRAST TECHNIQUE: Multidetector CT imaging of the neck was performed using the standard protocol following the bolus administration of intravenous contrast. CONTRAST:  68m OMNIPAQUE IOHEXOL 300 MG/ML  SOLN COMPARISON:  Correlation made with prior chest radiographs FINDINGS: Motion artifact is present. Pharynx and larynx: Unremarkable.  No mass or swelling. Salivary glands: Unremarkable. Thyroid: Unremarkable. Lymph nodes: No enlarged nodes identified. Vascular: Major neck vessels are patent. Limited intracranial: No abnormal enhancement. Visualized orbits: Not included. Mastoids and visualized paranasal sinuses: Aerated. Skeleton: No significant abnormality. Upper chest: Partially imaged pneumomediastinum. Bilateral pulmonary ground-glass opacities reflecting known pneumonia. Other: No mediastinum extends into the soft tissues of the neck IMPRESSION: Pneumomediastinum  extending into the neck soft tissues. In retrospect, this was probably present on 10/07/2019 chest radiograph. These results were called by telephone at the time of interpretation on 10/09/2019 at 12:42 pm to provider [facility], who verbally acknowledged these results. Electronically Signed   By: PMacy MisM.D.   On: 10/09/2019 12:50   DG CHEST PORT 1 VIEW  Result Date: 10/09/2019 CLINICAL DATA:  Shortness of breath.  COVID-19 positive. EXAM: PORTABLE CHEST 1 VIEW COMPARISON:  October 07, 2019 FINDINGS: Mild opacity in the lung bases. The heart, hila, mediastinum, lungs, and pleura are otherwise unremarkable. IMPRESSION: Mild opacities in the lung bases concerning for multifocal pneumonia. No other acute abnormalities. Electronically Signed   By: DDorise BullionIII M.D   On: 10/09/2019 14:58   Scheduled Meds: . vitamin C  500 mg Oral Daily  . baricitinib  4 mg Oral Daily  . calcium carbonate  1 tablet Oral BID  . enoxaparin (LOVENOX) injection  65 mg Subcutaneous Q24H  . insulin aspart  0-15 Units Subcutaneous TID WC  . insulin aspart  0-5 Units Subcutaneous QHS  . Ipratropium-Albuterol  1 puff Inhalation Q6H  . levothyroxine  200 mcg Oral QAC breakfast  . methylPREDNISolone (SOLU-MEDROL) injection  80 mg Intravenous Q12H  . pantoprazole  40 mg Oral BID  . polyethylene glycol  17 g Oral BID  . senna-docusate  1 tablet Oral BID  . sodium chloride flush  10-40 mL Intracatheter Q12H  . zinc sulfate  220 mg Oral Daily   Continuous Infusions:   LOS: 6 days   OKerney Elbe DO [facility] PAGER is on AMION  If 7PM-7AM, please contact night-coverage [email or web address]

## 2019-10-11 ENCOUNTER — Inpatient Hospital Stay (HOSPITAL_COMMUNITY): Payer: BC Managed Care – PPO

## 2019-10-11 LAB — CBC WITH DIFFERENTIAL/PLATELET
Abs Immature Granulocytes: 0.47 10*3/uL — ABNORMAL HIGH (ref 0.00–0.07)
Basophils Absolute: 0.1 10*3/uL (ref 0.0–0.1)
Basophils Relative: 0 %
Eosinophils Absolute: 0 10*3/uL (ref 0.0–0.5)
Eosinophils Relative: 0 %
HCT: 47 % — ABNORMAL HIGH (ref 36.0–46.0)
Hemoglobin: 15.6 g/dL — ABNORMAL HIGH (ref 12.0–15.0)
Immature Granulocytes: 3 %
Lymphocytes Relative: 3 %
Lymphs Abs: 0.6 10*3/uL — ABNORMAL LOW (ref 0.7–4.0)
MCH: 28.4 pg (ref 26.0–34.0)
MCHC: 33.2 g/dL (ref 30.0–36.0)
MCV: 85.5 fL (ref 80.0–100.0)
Monocytes Absolute: 0.5 10*3/uL (ref 0.1–1.0)
Monocytes Relative: 3 %
Neutro Abs: 17.2 10*3/uL — ABNORMAL HIGH (ref 1.7–7.7)
Neutrophils Relative %: 91 %
Platelets: 515 10*3/uL — ABNORMAL HIGH (ref 150–400)
RBC: 5.5 MIL/uL — ABNORMAL HIGH (ref 3.87–5.11)
RDW: 14.3 % (ref 11.5–15.5)
WBC: 18.9 10*3/uL — ABNORMAL HIGH (ref 4.0–10.5)
nRBC: 0 % (ref 0.0–0.2)

## 2019-10-11 LAB — COMPREHENSIVE METABOLIC PANEL
ALT: 23 U/L (ref 0–44)
AST: 18 U/L (ref 15–41)
Albumin: 4 g/dL (ref 3.5–5.0)
Alkaline Phosphatase: 44 U/L (ref 38–126)
Anion gap: 13 (ref 5–15)
BUN: 15 mg/dL (ref 6–20)
CO2: 25 mmol/L (ref 22–32)
Calcium: 9.3 mg/dL (ref 8.9–10.3)
Chloride: 100 mmol/L (ref 98–111)
Creatinine, Ser: 0.94 mg/dL (ref 0.44–1.00)
GFR calc Af Amer: >60 mL/min (ref >60)
GFR calc non Af Amer: >60 mL/min (ref >60)
Glucose, Bld: 222 mg/dL — ABNORMAL HIGH (ref 70–99)
Potassium: 4.2 mmol/L (ref 3.5–5.1)
Sodium: 138 mmol/L (ref 135–145)
Total Bilirubin: 1 mg/dL (ref 0.3–1.2)
Total Protein: 7.6 g/dL (ref 6.5–8.1)

## 2019-10-11 LAB — FIBRINOGEN: Fibrinogen: 459 mg/dL (ref 210–475)

## 2019-10-11 LAB — FERRITIN: Ferritin: 359 ng/mL — ABNORMAL HIGH (ref 11–307)

## 2019-10-11 LAB — MAGNESIUM: Magnesium: 2.6 mg/dL — ABNORMAL HIGH (ref 1.7–2.4)

## 2019-10-11 LAB — GLUCOSE, CAPILLARY
Glucose-Capillary: 157 mg/dL — ABNORMAL HIGH (ref 70–99)
Glucose-Capillary: 205 mg/dL — ABNORMAL HIGH (ref 70–99)
Glucose-Capillary: 173 mg/dL — ABNORMAL HIGH (ref 70–99)
Glucose-Capillary: 206 mg/dL — ABNORMAL HIGH (ref 70–99)

## 2019-10-11 LAB — SEDIMENTATION RATE: Sed Rate: 16 mm/hr (ref 0–22)

## 2019-10-11 LAB — PHOSPHORUS: Phosphorus: 2.9 mg/dL (ref 2.5–4.6)

## 2019-10-11 LAB — LACTATE DEHYDROGENASE: LDH: 335 U/L — ABNORMAL HIGH (ref 98–192)

## 2019-10-11 MED ORDER — HYDRALAZINE HCL 20 MG/ML IJ SOLN: 10.0000 mg | Freq: Four times a day (QID) | INTRAMUSCULAR | Status: DC | PRN

## 2019-10-11 NOTE — Plan of Care (Signed)
  Problem: Education: Goal: Knowledge of risk factors and measures for prevention of condition will improve Outcome: Progressing   Problem: Coping: Goal: Psychosocial and spiritual needs will be supported Outcome: Progressing Note: Pt is in tears.    Problem: Respiratory: Goal: Will maintain a patent airway Outcome: Progressing Goal: Complications related to the disease process, condition or treatment will be avoided or minimized Outcome: Progressing

## 2019-10-11 NOTE — Progress Notes (Addendum)
PROGRESS NOTE    Sarah Calhoun  XBM:[id number] DOB: Nov 28, 1995 DOA: 10/04/2019 PCP: Sarah Calhoun., MD   Brief Narrative:  Sarah Calhoun a 24 y.o.femalewith medical history significant ofhypothyroidism, morbid obesity with BMI of 49 who presented to emergency department with worsening shortness of breath, body ache, fever, chills, generalized weakness and lethargy since 1 week, she became more Calhoun of breath came to the hospital and was diagnosed with severe acute hypoxic respiratory failure due to COVID-19 pneumonia and admitted to the hospital.  Currently she is in the progressive care unit requiring heated high flow supplemental oxygen via nasal cannula and has received steroids, remdesivir and off label Baricitinib.   She has been stable on her oxygen however she is complaining of sore throat now.  10/09/19 Still complaining of neck pain worse than before so will obtain a CT Soft Tissue of the Neck w/ Contrast and add Tramadol for Pain. O2 requirements improving slowly but patient subjectively does not feel like she is. Has not had a bowel movement and states pain in the neck is worse when sh eats hard foods  10/10/19 CT Soft Tissue showed "Pneumomediastinum extending into the neck soft tissues." Case was discussed with Pulmonary who recommends to continue to observe carefully and follow serial CXR to watch for Pneumothorax. If she decompensates she likely has developed a Pneuothorax and will need a Chest Tube. Her O2 requirement continues to wean further and she is on 15 Liters HHFNC. She has been ambulating. Thinks the [place] is too much for her and yesterday when she went to CT she was placed on 15 L and a NRB and she thinks that was easier for her to breath that way so will attempt to wean to that.   10/11/19 She was weaned to 9 Liters of Supplemental O2 via [place] and is feeling better. Proning when able and will ambulate with nursing today. Will have nurse wean O2 further. She is  improving further   Assessment & Plan:   Principal Problem:   Acute hypoxemic respiratory failure due to COVID-19 [facility]) Active Problems:   Sepsis ([place])   Hypothyroidism   Pneumomediastinum ([facility])  Severe Viral Sepsis, poA Acute Respiratory Failure with Hypoxia 2/2 COVID-19 Pneumonia -SARS-CoV-2 PCR positive 09/30/19 -She has severe parenchymal lung injury and has been started on IV steroids high-dose along with Remdesivir and Baricitinib.  -Patient met criteria for severe sepsis on admission given SIRS criteria of a respiratory rate > 20 (24), HR >90 (113), and a Temperature of 101 with evidence of organ dysfunction given Acute Respiratory Failure with Hypoxia -Initial CXR showed "Central venous congestion.  No viral pneumonia identified" -CTA of the Chest showed "No evidence of pulmonary embolus. Multifocal bilateral nodular airspace disease, compatible with COVID-19 pneumonia -Repeat CXR 10/04/19 done and showed "The heart size and mediastinal contours are within normal limits. No pneumothorax or pleural effusion is noted. Continued presence of multiple opacity seen throughout both lungs concerning for multifocal pneumonia. The visualized skeletal structures are Unremarkable." -Sepsis Physiology is improved but Respiratory Status has worsened and is tenuous -Inflammatory Marker Trend: Recent Labs    10/09/19 0103 10/10/19 0339 10/11/19 1024  DDIMER 1.34*  --   --   FERRITIN 299 316* 359*  LDH 408* 352* 335*  CRP 0.7  --   --   -TG was 169 -BNP went from 46.6 -> 13.8 -> 19.4 -> 22.8 -Fibrinogen was 649 -> 561 -> 563 -> 480 -> 475 -> 459 -ESR was 40 ->  25 -> 27 -> 24 -> 16 Lab Results  Component Value Date   SARSCOV2NAA POSITIVE (A) 09/30/2019   -PCT was 0.10 / 0.12 on Admission  -SpO2: 90 % O2 Flow Rate (L/min): 13 L/min FiO2 (%): (S) 50 %; Continue Weaning as tolerated today was on 9 Liters of Supplemental O2 via [place] -Completed Remdesivir x5 days 10/04/19 ->  10/08/19 -Steroids x10 days; Currently getting IV Solu-Medrol 80 mg every 12 for 10 days total  -Received Baricitinib Off Label and continuing to get it 4 mg po daily x 14 Days (Day 7/14 complete) started 10/05/19 -> ; patient denies any known history of active diverticulitis, tuberculosis or hepatitis, understands the risks and benefits and wanted to proceed with Baricitinb treatment if required -CRP was grossly elevated at 9.2 but since has improved and gone to -> 7.9 ->4.1 -> 1.5 -> 0.7 x2  -Continue Airborne, contact precautions for 21 days from positive testing (since patient is admitted for COVID) -Contine Monitor inflammatory markers and LFTs.  -Encourage OOB, IS, FV; Did not have a flutter valve until yesterday  -C/w Acetaminophen 650 mg po q6prn for Fever  -Enoxaparin 65 mg q24h  -Blood Cx x2 showed NGTD at 5 Days  -Added Combivent 2 puff IH q6h and Changed Albuterol to 1-2 puff IH q4hprn -Continue to monitor temperature curve and continue with acetaminophen for mild fever -Antitussives with p.o. Robitussin-DM and Tussionex; Will add Benzonatate 100 mg po TIDprn as well  -Continue with zinc sulfate 220 mg p.o. daily along with vitamin C 500 mg p.o. daily -C/w Prone if able -Has a high risk for decompensation and respiratory status is tenuous -Continue monitor respiratory status carefully and  wean O2 as tolerated  -Repeat chest x-ray 10/11/19 showed "Stable pneumomediastinum. Minimal bibasilar subsegmental atelectasis or infiltrates are noted." -Will need an ambulatory home O2 screen prior to discharge when her O2 requirement further weans    Hypothyroidism. -Continue home dose Levothyroxine 200 mcg po Daily,   -TSH mildly elevated at 8.396; Free T4 was normal at 1.12 and T3 was Low at 49 -PCP to repeat in 4 to 6 weeks and may need to adjust dose -This could be sick euthyroid.  Leukocytosis, slightly worsening  -Likely Reactive and In the setting of Steroid Demargination -WBC went  from 10.0 -> 16.4 -> 18.9 -Continue to Monitor for S/S of Infection -Repeat CBC in the AM   Thrombocytosis -Likely Reactive -Patient's Platelet Count went from 396 -> 431 -> 515 -Continue to Monitor and Trend -Repeat CBC  Abnormal AST, improved  -Due to COVID viral infection  -Patient's AST went from 57 -> 51 -> 37 -> 26 -> 25 -> 18 -> 21 -> 18 -Continue to Monitor and Trend LFTs while getting Remdesivir   Erythrocytosis -Mild as Hgb/Hct went from 14.9/45.8 -> 15.6/47.0 -Continue to Monitor and Trend -Repeat CBC in the AM   Hyperglycemia in the setting of New Onset Diabetes Mellitus Type 2 -Closely monitor CBGs since patient is on steroids and expect Steroid Demargination -Check HbA1c is 6.6 -Blood Sugars ranging from 113-224 on Daily CMP's; CBGs ranging from 157-221 and Blood Sugar this AM was 22this AM on CMP -C/w Moderate Novolog SSI AC and HS and adjust Insulin as Necessary  GERD -Having reflux in the setting of her steroids -Started her on pantoprazole 40 mg p.o. twice daily and added calcium carbonate twice daily -May need Famotidine if not improving   Morbid Obesity -Estimated body mass index is 49.09 kg/m as calculated  from the following:   Height as of this encounter: _0  (1.651 m).   Weight as of this encounter: 133.8 kg.  -Continued Weight Loss and Dietary Counseling   Sore Throat, Neck Pain and mild odynophagia in the setting of Pneumomediastinum extending to the Neck -Sore throat initially thought the setting of reflux and coughing from Covid -Continue with antireflux medications, Chloraseptic spray as well as Cepacol lozenges -Continue to monitor for any worsening; she complained that she cannot talk now because of the sore throat and so points to pain under her chin and on her neck  -Neck pain was worse yesterday so will obtain a CT Soft Tissue of the Neck with Contrast and start on Tramadol 50 mg q6hprn -CT Soft Tissue of the Neck w/ Contrast showed  "Pneumomediastinum extending into the neck soft tissues. In retrospect, this was probably present on 10/07/2019 chest radiograph." -Continue to Monitor closely with serial CXRs and today's CXR was stable   Constipation, improved  -Started Bowel Regimen -C/w Miralax 17 grams BID and Senna-Docusate 1 tab po BID  -Still has not had a bowel movement yet -C/w Ambulation   DVT prophylaxis: Enoxaparin 65 mg q24h Code Status: FULL CODE  Family Communication: No family present at bedside  Disposition Plan: Pending further clinical Improvement and Weaning of Oxygen   Status is: Inpatient  Remains inpatient appropriate because:Unsafe d/c plan, IV treatments appropriate due to intensity of illness or inability to take PO and Inpatient level of care appropriate due to severity of illness   Dispo: The patient is from: Home              Anticipated d/c is to: Home              Anticipated d/c date is: 3 days              Patient currently is not medically stable to d/c.  Consultants:   None   Procedures: None  Antimicrobials:  Anti-infectives (From admission, onward)   Start     Dose/Rate Route Frequency Ordered Stop   10/05/19 1000  remdesivir 100 mg in sodium chloride 0.9 % 100 mL IVPB       "Followed by" Linked Group Details   100 mg 200 mL/hr over 30 Minutes Intravenous Daily 10/04/19 1358 10/08/19 0846   10/04/19 1400  remdesivir 200 mg in sodium chloride 0.9% 250 mL IVPB       "Followed by" Linked Group Details   200 mg 580 mL/hr over 30 Minutes Intravenous Once 10/04/19 1358 10/04/19 1635     Subjective: Seen and examined at bedside and has been doing well. Nurse was able to wean to 9 Liters yesterday. She is not complaining of any neck pain today. Had a bowel movement. No CP or SOB. No other concerns or complaints at this time.   Objective: Vitals:   10/10/19 1616 10/10/19 2034 10/11/19 0750 10/11/19 1224  BP: (!) 128/93 (!) 133/112 (!) 141/88 103/82  Pulse: (!) 101 (!)  103 92 (!) 108  Resp: _1 Temp: 98.1 F (36.7 C) 98.2 F (36.8 C) 98.4 F (36.9 C) 98.7 F (37.1 C)  TempSrc:      SpO2:  95% 97% 90%  Weight:      Height:        Intake/Output Summary (Last 24 hours) at 10/11/2019 1547 Last data filed at 10/11/2019 0556 Gross per 24 hour  Intake 240 ml  Output --  Net 240  ml   Filed Weights   10/04/19 1029  Weight: 133.8 kg   Examination: Physical Exam:  Constitutional: WN/WD morbidly obese AAF currently in no acute distress appears calm and more comfortable today Eyes: Lids and conjunctivae normal, sclerae anicteric  ENMT: External Ears, Nose appear normal. Grossly normal hearing.  Neck: Appears normal, supple, no cervical masses, normal ROM, no appreciable thyromegaly; no JVD Respiratory: Diminished to auscultation bilaterally with coarse breath sounds and some slight crackles but this is improving. no wheezing, rales, rhonchi. Normal respiratory effort and patient is not tachypenic. No accessory muscle use.  Unlabored breathing on 9 Liters of supplemental O2 via [facility] Cardiovascular: Tachycardic Rate but regular rythmn, No extremity edema. 2+ pedal pulses. No carotid bruits.  Abdomen: Soft, non-tender, Distended due to body habitus. Bowel sounds positive x4.  GU: Deferred. Musculoskeletal: No clubbing / cyanosis of digits/nails. No joint deformity upper and lower extremities.  Skin: No rashes, lesions, ulcers on a limited skin evaluation. No induration; Warm and dry.  Neurologic: CN 2-12 grossly intact with no focal deficits. Romberg sign and cerebellar reflexes not assessed.  Psychiatric: Normal judgment and insight. Alert and oriented x 3. Normal mood and appropriate affect.   Data Reviewed: I have personally reviewed following labs and imaging studies  CBC: Recent Labs  Lab 10/07/19 0500 10/08/19 0852 10/09/19 0103 10/10/19 0339 10/11/19 1024  WBC 7.5 8.7 10.0 16.4* 18.9*  NEUTROABS 5.2 6.6 7.5 14.4* 17.2*  HGB 13.7 13.9  14.6 14.9 15.6*  HCT 41.1 42.1 43.9 45.8 47.0*  MCV 84.0 84.4 85.1 85.0 85.5  PLT 327 338 396 431* 151*   Basic Metabolic Panel: Recent Labs  Lab 10/05/19 0248 10/06/19 0129 10/07/19 0500 10/08/19 0852 10/09/19 0103 10/10/19 0339 10/11/19 1024  NA 141   < > 142 140 139 138 138  K 3.8   < > 3.8 4.0 4.4 4.9 4.2  CL 103   < > 103 105 104 101 100  CO2 25   < > _0 GLUCOSE 146*   < > 152* 224* 183* 254* 222*  BUN 10   < > _1 CREATININE 0.86   < > 0.90 0.97 0.95 0.98 0.94  CALCIUM 8.3*   < > 8.7* 8.9 9.2 9.2 9.3  MG 2.8*   < > 2.8* 2.7* 2.6* 2.7* 2.6*  PHOS 3.4  --  3.9  --  4.1 4.4 2.9   < > = values in this interval not displayed.   GFR: Estimated Creatinine Clearance: 128.9 mL/min (by C-G formula based on SCr of 0.94 mg/dL). Liver Function Tests: Recent Labs  Lab 10/07/19 0500 10/08/19 0852 10/09/19 0103 10/10/19 0339 10/11/19 1024  AST _2 ALT _3 ALKPHOS 36* 38 36* 44 44  BILITOT 0.9 0.7 1.1 0.9 1.0  PROT 7.2 6.9 7.2 7.6 7.6  ALBUMIN 3.4* 3.5 3.7 3.9 4.0   No results for input(s): LIPASE, AMYLASE in the last 168 hours. No results for input(s): AMMONIA in the last 168 hours. Coagulation Profile: No results for input(s): INR, PROTIME in the last 168 hours. Cardiac Enzymes: No results for input(s): CKTOTAL, CKMB, CKMBINDEX, TROPONINI in the last 168 hours. BNP (last 3 results) No results for input(s): PROBNP in the last 8760 hours. HbA1C: No results for input(s): HGBA1C in the last 72 hours. CBG: Recent Labs  Lab 10/10/19 1211 10/10/19 1610 10/10/19 2030 10/11/19 0752 10/11/19 1221  GLUCAP 221* 189* 197* 157* 205*   Lipid Profile: No results for input(s): CHOL, HDL, LDLCALC, TRIG, CHOLHDL, LDLDIRECT in the last 72 hours. Thyroid Function Tests: No results for input(s): TSH, T4TOTAL, FREET4, T3FREE, THYROIDAB in the last 72 hours. Anemia Panel: Recent Labs    10/10/19 0339 10/11/19 1024  FERRITIN 316*  359*   Sepsis Labs: No results for input(s): PROCALCITON, LATICACIDVEN in the last 168 hours.  Recent Results (from the past 240 hour(s))  Blood Culture (routine x 2)     Status: None   Collection Time: 10/04/19 12:01 PM   Specimen: BLOOD RIGHT ARM  Result Value Ref Range Status   Specimen Description BLOOD RIGHT ARM  Final   Special Requests   Final    BOTTLES DRAWN AEROBIC AND ANAEROBIC Blood Culture adequate volume   Culture   Final    NO GROWTH 5 DAYS Performed at [facility], [address] [place], [place] [address]    Report Status 10/09/2019 FINAL  Final  Blood Culture (routine x 2)     Status: None   Collection Time: 10/04/19  1:50 PM   Specimen: BLOOD  Result Value Ref Range Status   Specimen Description BLOOD LEFT PICC LINE  Final   Special Requests   Final    BOTTLES DRAWN AEROBIC AND ANAEROBIC Blood Culture adequate volume   Culture   Final    NO GROWTH 5 DAYS Performed at [facility], [place] [address] [place], [place] [address]    Report Status 10/09/2019 FINAL  Final   RN Pressure Injury Documentation:     Estimated body mass index is 49.09 kg/m as calculated from the following:   Height as of this encounter: _0  (1.651 m).   Weight as of this encounter: 133.8 kg.  Malnutrition Type:      Malnutrition Characteristics:      Nutrition Interventions:    Radiology Studies: DG CHEST PORT 1 VIEW  Result Date: 10/11/2019 CLINICAL DATA:  Dyspnea, COVID-19 positive. EXAM: PORTABLE CHEST 1 VIEW COMPARISON:  October 10, 2019. FINDINGS: The heart size and mediastinal contours are within normal limits. Minimal bibasilar subsegmental atelectasis or infiltrates are noted. Stable pneumomediastinum. The visualized skeletal structures are unremarkable. IMPRESSION: Stable pneumomediastinum. Minimal bibasilar subsegmental atelectasis or infiltrates are noted. Electronically Signed   By: Marijo Conception M.D.   On: 10/11/2019 08:17   DG CHEST PORT 1  VIEW  Result Date: 10/10/2019 CLINICAL DATA:  Shortness of breath.  COVID. EXAM: PORTABLE CHEST 1 VIEW COMPARISON:  10/09/2019 FINDINGS: Stable cardiomediastinal contours. Aortic atherosclerosis noted. Decreased lung volumes. No change in diffuse interstitial prominence compatible with viral pneumonia. No lobar consolidation, pleural effusion or pneumothorax. Previous pneumomediastinum is less conspicuous on today's study. IMPRESSION: 1. Persistent bilateral interstitial prominence compatible with viral pneumonia. 2. Pneumomediastinum is less conspicuous on today's study. Electronically Signed   By: Kerby Moors M.D.   On: 10/10/2019 08:49   Scheduled Meds: . vitamin C  500 mg Oral Daily  . baricitinib  4 mg Oral Daily  . calcium carbonate  1 tablet Oral BID  . enoxaparin (LOVENOX) injection  65 mg Subcutaneous Q24H  . insulin aspart  0-15 Units Subcutaneous TID WC  . insulin aspart  0-5 Units Subcutaneous QHS  . Ipratropium-Albuterol  1 puff Inhalation Q6H  . levothyroxine  200 mcg Oral QAC breakfast  . methylPREDNISolone (SOLU-MEDROL) injection  80 mg Intravenous Q12H  . pantoprazole  40 mg Oral BID  .  polyethylene glycol  17 g Oral BID  . senna-docusate  1 tablet Oral BID  . sodium chloride flush  10-40 mL Intracatheter Q12H  . zinc sulfate  220 mg Oral Daily   Continuous Infusions:   LOS: 7 days   Kerney Elbe, DO [facility] PAGER is on AMION  If 7PM-7AM, please contact night-coverage [email or web address]

## 2019-10-12 ENCOUNTER — Inpatient Hospital Stay (HOSPITAL_COMMUNITY): Payer: BC Managed Care – PPO

## 2019-10-12 DIAGNOSIS — J1282 Pneumonia due to coronavirus disease 2019: Secondary | ICD-10-CM

## 2019-10-12 DIAGNOSIS — M542 Cervicalgia: Secondary | ICD-10-CM

## 2019-10-12 DIAGNOSIS — U071 COVID-19: Secondary | ICD-10-CM

## 2019-10-12 DIAGNOSIS — R739 Hyperglycemia, unspecified: Secondary | ICD-10-CM

## 2019-10-12 LAB — COMPREHENSIVE METABOLIC PANEL
ALT: 23 U/L (ref 0–44)
AST: 18 U/L (ref 15–41)
Albumin: 3.5 g/dL (ref 3.5–5.0)
Alkaline Phosphatase: 45 U/L (ref 38–126)
Anion gap: 13 (ref 5–15)
BUN: 16 mg/dL (ref 6–20)
CO2: 22 mmol/L (ref 22–32)
Calcium: 9.2 mg/dL (ref 8.9–10.3)
Chloride: 100 mmol/L (ref 98–111)
Creatinine, Ser: 0.81 mg/dL (ref 0.44–1.00)
GFR calc Af Amer: >60 mL/min (ref >60)
GFR calc non Af Amer: >60 mL/min (ref >60)
Glucose, Bld: 166 mg/dL — ABNORMAL HIGH (ref 70–99)
Potassium: 3.9 mmol/L (ref 3.5–5.1)
Sodium: 135 mmol/L (ref 135–145)
Total Bilirubin: 1.3 mg/dL — ABNORMAL HIGH (ref 0.3–1.2)
Total Protein: 7.5 g/dL (ref 6.5–8.1)

## 2019-10-12 LAB — GLUCOSE, CAPILLARY
Glucose-Capillary: 143 mg/dL — ABNORMAL HIGH (ref 70–99)
Glucose-Capillary: 223 mg/dL — ABNORMAL HIGH (ref 70–99)
Glucose-Capillary: 244 mg/dL — ABNORMAL HIGH (ref 70–99)
Glucose-Capillary: 260 mg/dL — ABNORMAL HIGH (ref 70–99)

## 2019-10-12 LAB — CBC WITH DIFFERENTIAL/PLATELET
Abs Immature Granulocytes: 0.5 10*3/uL — ABNORMAL HIGH (ref 0.00–0.07)
Basophils Absolute: 0.1 10*3/uL (ref 0.0–0.1)
Basophils Relative: 0 %
Eosinophils Absolute: 0 10*3/uL (ref 0.0–0.5)
Eosinophils Relative: 0 %
HCT: 46.7 % — ABNORMAL HIGH (ref 36.0–46.0)
Hemoglobin: 15.7 g/dL — ABNORMAL HIGH (ref 12.0–15.0)
Immature Granulocytes: 3 %
Lymphocytes Relative: 6 %
Lymphs Abs: 1 10*3/uL (ref 0.7–4.0)
MCH: 28.5 pg (ref 26.0–34.0)
MCHC: 33.6 g/dL (ref 30.0–36.0)
MCV: 84.9 fL (ref 80.0–100.0)
Monocytes Absolute: 0.7 10*3/uL (ref 0.1–1.0)
Monocytes Relative: 4 %
Neutro Abs: 14.4 10*3/uL — ABNORMAL HIGH (ref 1.7–7.7)
Neutrophils Relative %: 87 %
Platelets: 546 10*3/uL — ABNORMAL HIGH (ref 150–400)
RBC: 5.5 MIL/uL — ABNORMAL HIGH (ref 3.87–5.11)
RDW: 14 % (ref 11.5–15.5)
WBC: 16.7 10*3/uL — ABNORMAL HIGH (ref 4.0–10.5)
nRBC: 0 % (ref 0.0–0.2)

## 2019-10-12 LAB — MAGNESIUM: Magnesium: 2.5 mg/dL — ABNORMAL HIGH (ref 1.7–2.4)

## 2019-10-12 LAB — PHOSPHORUS: Phosphorus: 3.7 mg/dL (ref 2.5–4.6)

## 2019-10-12 NOTE — Progress Notes (Signed)
PROGRESS NOTE    Sarah Calhoun  ION:[id number] DOB: 1995-12-15 DOA: 10/04/2019 PCP: Loraine Leriche., MD   Brief Narrative:  Sarah Calhoun a 24 y.o.femalewith medical history significant ofhypothyroidism, morbid obesity with BMI of 49 who presented to emergency department with worsening shortness of breath, body ache, fever, chills, generalized weakness and lethargy since 1 week, she became more Calhoun of breath came to the hospital and was diagnosed with severe acute hypoxic respiratory failure due to COVID-19 pneumonia and admitted to the hospital.  Currently she is in the progressive care unit requiring heated high flow supplemental oxygen via nasal cannula and has received steroids, remdesivir and off label Baricitinib.   She has been stable on her oxygen however she is complaining of sore throat now.  10/09/19 Still complaining of neck pain worse than before so will obtain a CT Soft Tissue of the Neck w/ Contrast and add Tramadol for Pain. O2 requirements improving slowly but patient subjectively does not feel like she is. Has not had a bowel movement and states pain in the neck is worse when sh eats hard foods  10/10/19 CT Soft Tissue showed "Pneumomediastinum extending into the neck soft tissues." Case was discussed with Pulmonary who recommends to continue to observe carefully and follow serial CXR to watch for Pneumothorax. If she decompensates she likely has developed a Pneuothorax and will need a Chest Tube. Her O2 requirement continues to wean further and she is on 15 Liters HHFNC. She has been ambulating. Thinks the [place] is too much for her and yesterday when she went to CT she was placed on 15 L and a NRB and she thinks that was easier for her to breath that way so will attempt to wean to that.   10/11/19 She was weaned to 9 Liters of Supplemental O2 via [place] and is feeling better. Proning when able and will ambulate with nursing today. Will have nurse wean O2 further. She is  improving further   10/12/19 Is doing much better today and ambulated the hall 3 times and her oxygen was weaned to 4 L.  If she can remain 2 L or less and not desaturate she can be discharged home.  She continues to remain compliant with proning  Assessment & Plan:   Principal Problem:   Acute hypoxemic respiratory failure due to COVID-19 [facility] Active Problems:   Sepsis ([place])   Hypothyroidism   Pneumomediastinum ([facility])  Severe Viral Sepsis, poA Acute Respiratory Failure with Hypoxia 2/2 COVID-19 Pneumonia -SARS-CoV-2 PCR positive 09/30/19 -She has severe parenchymal lung injury and has been started on IV steroids high-dose along with Remdesivir and Baricitinib.  -Patient met criteria for severe sepsis on admission given SIRS criteria of a respiratory rate > 20 (24), HR >90 (113), and a Temperature of 101 with evidence of organ dysfunction given Acute Respiratory Failure with Hypoxia -Initial CXR showed "Central venous congestion.  No viral pneumonia identified" -CTA of the Chest showed "No evidence of pulmonary embolus. Multifocal bilateral nodular airspace disease, compatible with COVID-19 pneumonia -Repeat CXR 10/04/19 done and showed "The heart size and mediastinal contours are within normal limits. No pneumothorax or pleural effusion is noted. Continued presence of multiple opacity seen throughout both lungs concerning for multifocal pneumonia. The visualized skeletal structures are Unremarkable." -Sepsis Physiology is improved but Respiratory Status has worsened and is tenuous -Inflammatory Marker Trend: Recent Labs    10/10/19 0339 10/11/19 1024  FERRITIN 316* 359*  LDH 352* 335*  -TG was 169 -BNP went from  46.6 -> 13.8 -> 19.4 -> 22.8 -Fibrinogen was 649 -> 561 -> 563 -> 480 -> 475 -> 459 -ESR was 40 -> 25 -> 27 -> 24 -> 16 Lab Results  Component Value Date   SARSCOV2NAA POSITIVE (A) 09/30/2019   -PCT was 0.10 / 0.12 on Admission  -SpO2: 95 % O2 Flow Rate (L/min): 4  L/min FiO2 (%): (S) 50 %; Continue Weaning as tolerated today was on 4 Liters of Supplemental O2 via [place] -Completed Remdesivir x5 days 10/04/19 -> 10/08/19 -Steroids x10 days; Currently getting IV Solu-Medrol 80 mg every 12 for 10 days total  -Received Baricitinib Off Label and continuing to get it 4 mg po daily x 14 Days (Day 7/14 complete) started 10/05/19 -> ; patient denies any known history of active diverticulitis, tuberculosis or hepatitis, understands the risks and benefits and wanted to proceed with Baricitinb treatment if required -CRP was grossly elevated at 9.2 but since has improved and gone to -> 7.9 ->4.1 -> 1.5 -> 0.7 x2  -Continue Airborne, contact precautions for 21 days from positive testing (since patient is admitted for COVID) -Contine Monitor inflammatory markers and LFTs.  -Encourage OOB, IS, FV; Did not have a flutter valve until yesterday  -C/w Acetaminophen 650 mg po q6prn for Fever  -Enoxaparin 65 mg q24h  -Blood Cx x2 showed NGTD at 5 Days  -Added Combivent 2 puff IH q6h and Changed Albuterol to 1-2 puff IH q4hprn -Continue to monitor temperature curve and continue with acetaminophen for mild fever -Antitussives with p.o. Robitussin-DM and Tussionex; Will add Benzonatate 100 mg po TIDprn as well  -Continue with zinc sulfate 220 mg p.o. daily along with vitamin C 500 mg p.o. daily -C/w Prone if able -Has a high risk for decompensation and respiratory status is tenuous -Continue monitor respiratory status carefully and  wean O2 as tolerated  -Repeat chest x-ray 10/11/19 showed "Stable pneumomediastinum. Minimal bibasilar subsegmental atelectasis or infiltrates are noted." -Will need an ambulatory home O2 screen prior to discharge when her O2 requirement further weans    Hypothyroidism. -Continue home dose Levothyroxine 200 mcg po Daily,   -TSH mildly elevated at 8.396; Free T4 was normal at 1.12 and T3 was Low at 49 -PCP to repeat in 4 to 6 weeks and may need to adjust  dose -This could be sick euthyroid.  Leukocytosis -Likely Reactive and In the setting of Steroid Demargination -WBC went from 10.0 -> 16.4 -> 18.9 -> and is now 16.7 -Continue to Monitor for S/S of Infection -Repeat CBC in the AM   Thrombocytosis -Likely Reactive -Patient's Platelet Count went from 396 -> 431 -> 515 -> 546 -Continue to Monitor and Trend -Repeat CBC  Abnormal AST, improved  -Due to COVID viral infection  -Patient's AST went from 57 -> and is improved to 18 -Continue to Monitor and Trend LFTs while getting Remdesivir   Erythrocytosis -Mild as Hgb/Hct went from 14.9/45.8 -> 15.6/47.0 -> 15.7/46.7 -Continue to Monitor and Trend -Repeat CBC in the AM   Hyperglycemia in the setting of New Onset Diabetes Mellitus Type 2 -Closely monitor CBGs since patient is on steroids and expect Steroid Demargination -Check HbA1c is 6.6 -Blood Sugars ranging from 113-224 on Daily CMP's; CBGs ranging from 157-221 and Blood Sugar this AM was 166 this AM on CMP -C/w Moderate Novolog SSI AC and HS and adjust Insulin as Necessary  GERD -Having reflux in the setting of her steroids -Started her on pantoprazole 40 mg p.o. twice daily and added  calcium carbonate twice daily -May need Famotidine if not improving   Morbid Obesity -Estimated body mass index is 49.09 kg/m as calculated from the following:   Height as of this encounter: _0  (1.651 m).   Weight as of this encounter: 133.8 kg.  -Continued Weight Loss and Dietary Counseling   Sore Throat, Neck Pain and mild odynophagia in the setting of Pneumomediastinum extending to the Neck -Sore throat initially thought the setting of reflux and coughing from Covid -Continue with antireflux medications, Chloraseptic spray as well as Cepacol lozenges -Continue to monitor for any worsening; she complained that she cannot talk now because of the sore throat and so points to pain under her chin and on her neck  -Neck pain was worse  yesterday so will obtain a CT Soft Tissue of the Neck with Contrast and start on Tramadol 50 mg q6hprn -CT Soft Tissue of the Neck w/ Contrast showed "Pneumomediastinum extending into the neck soft tissues. In retrospect, this was probably present on 10/07/2019 chest radiograph." -Continue to Monitor closely with serial CXRs and today's CXR was stable and showed "Pneumomediastinum again noted. Low lung volumes with bibasilar pulmonary infiltrates again noted. No interim change."  Constipation, improved  -Started Bowel Regimen -C/w Miralax 17 grams BID and Senna-Docusate 1 tab po BID  -Still has not had a bowel movement yet -C/w Ambulation   DVT prophylaxis: Enoxaparin 65 mg q24h Code Status: FULL CODE  Family Communication: No family present at bedside  Disposition Plan: Pending further clinical Improvement and Weaning of Oxygen; Anticipate 1-3 days Status is: Inpatient  Remains inpatient appropriate because:Unsafe d/c plan, IV treatments appropriate due to intensity of illness or inability to take PO and Inpatient level of care appropriate due to severity of illness   Dispo: The patient is from: Home              Anticipated d/c is to: Home              Anticipated d/c date is: 2 days              Patient currently is not medically stable to d/c.  Consultants:   None   Procedures: None  Antimicrobials:  Anti-infectives (From admission, onward)   Start     Dose/Rate Route Frequency Ordered Stop   10/05/19 1000  remdesivir 100 mg in sodium chloride 0.9 % 100 mL IVPB       "Followed by" Linked Group Details   100 mg 200 mL/hr over 30 Minutes Intravenous Daily 10/04/19 1358 10/08/19 0846   10/04/19 1400  remdesivir 200 mg in sodium chloride 0.9% 250 mL IVPB       "Followed by" Linked Group Details   200 mg 580 mL/hr over 30 Minutes Intravenous Once 10/04/19 1358 10/04/19 1635     Subjective: Seen and examined at bedside and she continues to do well on her oxygen was weaned  to 4 L.  No chest pain, lightheadedness or dizziness and denies any neck pain today.  Feels better and wants to ambulate and wants to ambulate with the oxygen tank.  No other concerns or complaints at this time.  Objective: Vitals:   10/12/19 0806 10/12/19 1224 10/12/19 1634 10/12/19 1835  BP: 126/80 (!) 141/87 (!) 150/87   Pulse: 87 (!) 109 (!) 105   Resp: _1 Temp: 99.3 F (37.4 C) 98.1 F (36.7 C) 98.5 F (36.9 C)   TempSrc:      SpO2: 96%  95% 94% 95%  Weight:      Height:       No intake or output data in the 24 hours ending 10/12/19 1919 Filed Weights   10/04/19 1029  Weight: 133.8 kg   Examination: Physical Exam:  Constitutional: WN/WD morbidly obese African-American female currently in NAD and appears calm and comfortable Eyes: Lids and conjunctivae normal, sclerae anicteric  ENMT: External Ears, Nose appear normal. Grossly normal hearing.  Neck: Appears normal, supple, no cervical masses, normal ROM, no appreciable thyromegaly; no JVD and not as painful today to palpate Respiratory: Diminished to auscultation bilaterally with coarse breath sounds and some slight crackles but this is improved from yesterday. no wheezing, rales, rhonchi. Normal respiratory effort and patient is not tachypenic. No accessory muscle use.  Unlabored breathing and remains on 9 L this morning but was weaned to 4 L this evening Cardiovascular: Slightly tachycardic rate but regular rhythm, no murmurs / rubs / gallops. S1 and S2 auscultated.  Trace extremity edema.  Abdomen: Soft, non-tender, distended secondary body habitus. No masses palpated. No appreciable hepatosplenomegaly. Bowel sounds positive.  GU: Deferred. Musculoskeletal: No clubbing / cyanosis of digits/nails. No joint deformity upper and lower extremities.  Skin: No rashes, lesions, ulcers on limited skin evaluation. No induration; Warm and dry.  Neurologic: CN 2-12 grossly intact with no focal deficits. Romberg sign and  cerebellar reflexes not assessed.  Psychiatric: Normal judgment and insight. Alert and oriented x 3. Normal mood and appropriate affect.   Data Reviewed: I have personally reviewed following labs and imaging studies  CBC: Recent Labs  Lab 10/08/19 0852 10/09/19 0103 10/10/19 0339 10/11/19 1024 10/12/19 0515  WBC 8.7 10.0 16.4* 18.9* 16.7*  NEUTROABS 6.6 7.5 14.4* 17.2* 14.4*  HGB 13.9 14.6 14.9 15.6* 15.7*  HCT 42.1 43.9 45.8 47.0* 46.7*  MCV 84.4 85.1 85.0 85.5 84.9  PLT 338 396 431* 515* 962*   Basic Metabolic Panel: Recent Labs  Lab 10/07/19 0500 10/07/19 0500 10/08/19 0852 10/09/19 0103 10/10/19 0339 10/11/19 1024 10/12/19 0515  NA 142   < > 140 139 138 138 135  K 3.8   < > 4.0 4.4 4.9 4.2 3.9  CL 103   < > 105 104 101 100 100  CO2 26   < > _0 GLUCOSE 152*   < > 224* 183* 254* 222* 166*  BUN 17   < > _1 CREATININE 0.90   < > 0.97 0.95 0.98 0.94 0.81  CALCIUM 8.7*   < > 8.9 9.2 9.2 9.3 9.2  MG 2.8*   < > 2.7* 2.6* 2.7* 2.6* 2.5*  PHOS 3.9  --   --  4.1 4.4 2.9 3.7   < > = values in this interval not displayed.   GFR: Estimated Creatinine Clearance: 149.6 mL/min (by C-G formula based on SCr of 0.81 mg/dL). Liver Function Tests: Recent Labs  Lab 10/08/19 0852 10/09/19 0103 10/10/19 0339 10/11/19 1024 10/12/19 0515  AST _2 ALT _3 ALKPHOS 38 36* 44 44 45  BILITOT 0.7 1.1 0.9 1.0 1.3*  PROT 6.9 7.2 7.6 7.6 7.5  ALBUMIN 3.5 3.7 3.9 4.0 3.5   No results for input(s): LIPASE, AMYLASE in the last 168 hours. No results for input(s): AMMONIA in the last 168 hours. Coagulation Profile: No results for input(s): INR, PROTIME in the last 168 hours. Cardiac Enzymes: No results for input(s): CKTOTAL,  CKMB, CKMBINDEX, TROPONINI in the last 168 hours. BNP (last 3 results) No results for input(s): PROBNP in the last 8760 hours. HbA1C: No results for input(s): HGBA1C in the last 72 hours. CBG: Recent Labs  Lab  10/11/19 1555 10/11/19 2023 10/12/19 0803 10/12/19 1225 10/12/19 1636  GLUCAP 173* 206* 143* 223* 244*   Lipid Profile: No results for input(s): CHOL, HDL, LDLCALC, TRIG, CHOLHDL, LDLDIRECT in the last 72 hours. Thyroid Function Tests: No results for input(s): TSH, T4TOTAL, FREET4, T3FREE, THYROIDAB in the last 72 hours. Anemia Panel: Recent Labs    10/10/19 0339 10/11/19 1024  FERRITIN 316* 359*   Sepsis Labs: No results for input(s): PROCALCITON, LATICACIDVEN in the last 168 hours.  Recent Results (from the past 240 hour(s))  Blood Culture (routine x 2)     Status: None   Collection Time: 10/04/19 12:01 PM   Specimen: BLOOD RIGHT ARM  Result Value Ref Range Status   Specimen Description BLOOD RIGHT ARM  Final   Special Requests   Final    BOTTLES DRAWN AEROBIC AND ANAEROBIC Blood Culture adequate volume   Culture   Final    NO GROWTH 5 DAYS Performed at [facility], [address], [place], [place] [address]    Report Status 10/09/2019 FINAL  Final  Blood Culture (routine x 2)     Status: None   Collection Time: 10/04/19  1:50 PM   Specimen: BLOOD  Result Value Ref Range Status   Specimen Description BLOOD LEFT PICC LINE  Final   Special Requests   Final    BOTTLES DRAWN AEROBIC AND ANAEROBIC Blood Culture adequate volume   Culture   Final    NO GROWTH 5 DAYS Performed at [facility], [place] [address] [place], [place] [address]    Report Status 10/09/2019 FINAL  Final   RN Pressure Injury Documentation:     Estimated body mass index is 49.09 kg/m as calculated from the following:   Height as of this encounter: _0  (1.651 m).   Weight as of this encounter: 133.8 kg.  Malnutrition Type:      Malnutrition Characteristics:      Nutrition Interventions:    Radiology Studies: DG CHEST PORT 1 VIEW  Result Date: 10/12/2019 CLINICAL DATA:  COVID. EXAM: PORTABLE CHEST 1 VIEW COMPARISON:  10/11/2019 CT neck 10/09/2019. FINDINGS:  Pneumomediastinum again noted. Heart size stable. Low lung volumes with bibasilar pulmonary infiltrates again noted. No pleural effusion. Thorax IMPRESSION: 1.  Pneumomediastinum again noted. 2. Low lung volumes with bibasilar pulmonary infiltrates again noted. No interim change Electronically Signed   By: Marcello Moores  Register   On: 10/12/2019 07:11   DG CHEST PORT 1 VIEW  Result Date: 10/11/2019 CLINICAL DATA:  Dyspnea, COVID-19 positive. EXAM: PORTABLE CHEST 1 VIEW COMPARISON:  October 10, 2019. FINDINGS: The heart size and mediastinal contours are within normal limits. Minimal bibasilar subsegmental atelectasis or infiltrates are noted. Stable pneumomediastinum. The visualized skeletal structures are unremarkable. IMPRESSION: Stable pneumomediastinum. Minimal bibasilar subsegmental atelectasis or infiltrates are noted. Electronically Signed   By: Marijo Conception M.D.   On: 10/11/2019 08:17   Scheduled Meds: . vitamin C  500 mg Oral Daily  . baricitinib  4 mg Oral Daily  . calcium carbonate  1 tablet Oral BID  . enoxaparin (LOVENOX) injection  65 mg Subcutaneous Q24H  . insulin aspart  0-15 Units Subcutaneous TID WC  . insulin aspart  0-5 Units Subcutaneous QHS  . Ipratropium-Albuterol  1 puff Inhalation Q6H  . levothyroxine  200 mcg Oral QAC breakfast  . methylPREDNISolone (SOLU-MEDROL) injection  80 mg Intravenous Q12H  . pantoprazole  40 mg Oral BID  . polyethylene glycol  17 g Oral BID  . senna-docusate  1 tablet Oral BID  . sodium chloride flush  10-40 mL Intracatheter Q12H  . zinc sulfate  220 mg Oral Daily   Continuous Infusions:   LOS: 8 days   Kerney Elbe, DO [facility] PAGER is on AMION  If 7PM-7AM, please contact night-coverage [email or web address]

## 2019-10-13 ENCOUNTER — Inpatient Hospital Stay (HOSPITAL_COMMUNITY): Payer: BC Managed Care – PPO

## 2019-10-13 DIAGNOSIS — J982 Interstitial emphysema: Secondary | ICD-10-CM

## 2019-10-13 LAB — URINALYSIS, ROUTINE W REFLEX MICROSCOPIC
Bacteria, UA: NONE SEEN
Bilirubin Urine: NEGATIVE
Glucose, UA: >=500 mg/dL — AB
Ketones, ur: NEGATIVE mg/dL
Leukocytes,Ua: NEGATIVE
Nitrite: NEGATIVE
Protein, ur: NEGATIVE mg/dL
Specific Gravity, Urine: 1.011 (ref 1.005–1.030)
pH: 5 (ref 5.0–8.0)

## 2019-10-13 LAB — COMPREHENSIVE METABOLIC PANEL
ALT: 19 U/L (ref 0–44)
AST: 18 U/L (ref 15–41)
Albumin: 3.9 g/dL (ref 3.5–5.0)
Alkaline Phosphatase: 45 U/L (ref 38–126)
Anion gap: 12 (ref 5–15)
BUN: 17 mg/dL (ref 6–20)
CO2: 25 mmol/L (ref 22–32)
Calcium: 9.2 mg/dL (ref 8.9–10.3)
Chloride: 99 mmol/L (ref 98–111)
Creatinine, Ser: 0.91 mg/dL (ref 0.44–1.00)
GFR calc Af Amer: >60 mL/min (ref >60)
GFR calc non Af Amer: >60 mL/min (ref >60)
Glucose, Bld: 136 mg/dL — ABNORMAL HIGH (ref 70–99)
Potassium: 4.3 mmol/L (ref 3.5–5.1)
Sodium: 136 mmol/L (ref 135–145)
Total Bilirubin: 1.8 mg/dL — ABNORMAL HIGH (ref 0.3–1.2)
Total Protein: 7.5 g/dL (ref 6.5–8.1)

## 2019-10-13 LAB — CBC WITH DIFFERENTIAL/PLATELET
Abs Immature Granulocytes: 0.3 10*3/uL — ABNORMAL HIGH (ref 0.00–0.07)
Basophils Absolute: 0.1 10*3/uL (ref 0.0–0.1)
Basophils Relative: 0 %
Eosinophils Absolute: 0 10*3/uL (ref 0.0–0.5)
Eosinophils Relative: 0 %
HCT: 46.1 % — ABNORMAL HIGH (ref 36.0–46.0)
Hemoglobin: 15.6 g/dL — ABNORMAL HIGH (ref 12.0–15.0)
Immature Granulocytes: 2 %
Lymphocytes Relative: 8 %
Lymphs Abs: 1.4 10*3/uL (ref 0.7–4.0)
MCH: 28.6 pg (ref 26.0–34.0)
MCHC: 33.8 g/dL (ref 30.0–36.0)
MCV: 84.6 fL (ref 80.0–100.0)
Monocytes Absolute: 1 10*3/uL (ref 0.1–1.0)
Monocytes Relative: 6 %
Neutro Abs: 14.7 10*3/uL — ABNORMAL HIGH (ref 1.7–7.7)
Neutrophils Relative %: 84 %
Platelets: 485 10*3/uL — ABNORMAL HIGH (ref 150–400)
RBC: 5.45 MIL/uL — ABNORMAL HIGH (ref 3.87–5.11)
RDW: 14.1 % (ref 11.5–15.5)
WBC: 17.5 10*3/uL — ABNORMAL HIGH (ref 4.0–10.5)
nRBC: 0 % (ref 0.0–0.2)

## 2019-10-13 LAB — GLUCOSE, CAPILLARY
Glucose-Capillary: 163 mg/dL — ABNORMAL HIGH (ref 70–99)
Glucose-Capillary: 259 mg/dL — ABNORMAL HIGH (ref 70–99)
Glucose-Capillary: 310 mg/dL — ABNORMAL HIGH (ref 70–99)
Glucose-Capillary: 232 mg/dL — ABNORMAL HIGH (ref 70–99)

## 2019-10-13 LAB — PHOSPHORUS: Phosphorus: 2.9 mg/dL (ref 2.5–4.6)

## 2019-10-13 LAB — MAGNESIUM: Magnesium: 2.5 mg/dL — ABNORMAL HIGH (ref 1.7–2.4)

## 2019-10-13 MED ORDER — METHYLPREDNISOLONE SODIUM SUCC 40 MG IJ SOLR
40.0000 mg | Freq: Two times a day (BID) | INTRAMUSCULAR | Status: AC
  Administered 2019-10-13 – 2019-10-14 (×2): 40 mg via INTRAVENOUS
  Filled 2019-10-13 (×2): qty 1

## 2019-10-13 MED ORDER — FUROSEMIDE 10 MG/ML IJ SOLN
60.0000 mg | Freq: Once | INTRAMUSCULAR | Status: AC
  Administered 2019-10-13: 60 mg via INTRAVENOUS
  Filled 2019-10-13: qty 6

## 2019-10-13 NOTE — Progress Notes (Signed)
Pt proning during the night.

## 2019-10-13 NOTE — Progress Notes (Signed)
PROGRESS NOTE  Sarah Calhoun IOM:[id number] DOB: 07/29/1995 DOA: 10/04/2019  PCP: Loraine Leriche., MD  Brief History/Interval Summary: 24 y.o.femalewith medical history significant ofhypothyroidism, morbid obesity with BMI of 49 who presented to emergency department with worsening shortness of breath, body ache, fever, chills, generalized weakness and lethargy since 1 week,she became more short of breath came to the hospital and was diagnosed with severe acute hypoxic respiratory failure due to COVID-19 pneumonia and admitted to the hospital.  Reason for Visit: Pneumonia due to COVID-19.  Pneumomediastinum  Consultants: None  Procedures: None  Antibiotics: Anti-infectives (From admission, onward)   Start     Dose/Rate Route Frequency Ordered Stop   10/05/19 1000  remdesivir 100 mg in sodium chloride 0.9 % 100 mL IVPB       "Followed by" Linked Group Details   100 mg 200 mL/hr over 30 Minutes Intravenous Daily 10/04/19 1358 10/08/19 0846   10/04/19 1400  remdesivir 200 mg in sodium chloride 0.9% 250 mL IVPB       "Followed by" Linked Group Details   200 mg 580 mL/hr over 30 Minutes Intravenous Once 10/04/19 1358 10/04/19 1635      Subjective/Interval History: Patient states that she is feeling slightly better in terms of her shortness of breath.  Continues to have a dry cough.  Denies any chest pain.  No nausea vomiting.  No diarrhea.    Assessment/Plan:  Acute Hypoxic Resp. Failure/Pneumonia due to COVID-19/severe sepsis, present on admission, now resolved   Recent Labs  Lab 10/07/19 0500 10/07/19 0500 10/07/19 0900 10/08/19 0852 10/08/19 0852 10/09/19 0103 10/10/19 0339 10/11/19 1024 10/12/19 0515 10/13/19 0628  DDIMER 1.31*  --   --  1.26*  --  1.34*  --   --   --   --   FERRITIN  --   --  308* 270  --  299 316* 359*  --   --   CRP  --   --  1.5* 0.7  --  0.7  --   --   --   --   ALT 28   < >  --  25   < > 23 25 23 23 19    < > = values in this  interval not displayed.   Patient remains afebrile.  Remains on 3 to 4 L of oxygen saturating in the early 90s.  Patient has completed course of Remdesivir.  Patient remains on steroids, Solu-Medrol 80 mg every 12 hours.  Patient's inflammatory markers were normal when last checked a few days ago.  D-dimer was 1.34.  She remains on DVT prophylaxis.  Patient is also on baricitinib.  Continue to mobilize, out of bed to chair, prone positioning.  Patient told not to use her incentive spirometry much due to pneumomediastinum. Leukocytosis is due to steroids.  Give Lasix today x1.  The treatment plan and use of medications and known side effects were discussed with patient/family. Some of the medications used are based on case reports/anecdotal data.  All other medications being used in the management of COVID-19 based on limited study data.  Complete risks and long-term side effects are unknown, however in the best clinical judgment they seem to be of some benefit.  Patient/family wanted to proceed with treatment options provided.  Pneumomediastinum This was detected on chest x-ray done on 8/30.  Chest x-ray done earlier today did not show any pneumomediastinum.  We will repeat another chest x-ray tomorrow morning.  Briefly discussed with pulmonology.  Since patient is otherwise stable no other interventions recommended at this time.  Hypothyroidism Continue home medications.  TSH was noted to be elevated at 8.3 but free T4 was normal at 1.12.  Can be pursued in the outpatient setting.  Thrombocytosis Stable.  Abnormal LFTs Continue to monitor periodically.  New onset diabetes mellitus type 2 HbA1c 6.6.  Elevated CBGs most likely due to steroids.  Will need definitive management in the outpatient setting.  History of GERD PPI  Constipation Improved with bowel regimen.  Morbid obesity Estimated body mass index is 49.09 kg/m as calculated from the following:   Height as of this encounter: 5'  5" (1.651 m).   Weight as of this encounter: 133.8 kg.   DVT Prophylaxis: Lovenox Code Status: Full code Family Communication: Discussed with the patient Disposition Plan: Hopefully return home when improved  Status is: Inpatient  Remains inpatient appropriate because:IV treatments appropriate due to intensity of illness or inability to take PO and Inpatient level of care appropriate due to severity of illness   Dispo: The patient is from: Home              Anticipated d/c is to: Home              Anticipated d/c date is: 2 days              Patient currently is not medically stable to d/c.      Medications:  Scheduled: . vitamin C  500 mg Oral Daily  . baricitinib  4 mg Oral Daily  . calcium carbonate  1 tablet Oral BID  . enoxaparin (LOVENOX) injection  65 mg Subcutaneous Q24H  . insulin aspart  0-15 Units Subcutaneous TID WC  . insulin aspart  0-5 Units Subcutaneous QHS  . Ipratropium-Albuterol  1 puff Inhalation Q6H  . levothyroxine  200 mcg Oral QAC breakfast  . methylPREDNISolone (SOLU-MEDROL) injection  80 mg Intravenous Q12H  . pantoprazole  40 mg Oral BID  . polyethylene glycol  17 g Oral BID  . senna-docusate  1 tablet Oral BID  . sodium chloride flush  10-40 mL Intracatheter Q12H  . zinc sulfate  220 mg Oral Daily   Continuous:  TTS:[id number], albuterol, chlorpheniramine-HYDROcodone, guaiFENesin-dextromethorphan, hydrALAZINE, menthol-cetylpyridinium, [DISCONTINUED] ondansetron **OR** ondansetron (ZOFRAN) IV, phenol, sodium chloride flush, traMADol   Objective:  Vital Signs  Vitals:   10/13/19 0112 10/13/19 0200 10/13/19 0300 10/13/19 0809  BP:    (!) 153/109  Pulse:    (!) 109  Resp: (!) 24   20  Temp:    97.8 F (36.6 C)  TempSrc:      SpO2: 99% 92% 94% 90%  Weight:      Height:        Intake/Output Summary (Last 24 hours) at 10/13/2019 1158 Last data filed at 10/12/2019 2030 Gross per 24 hour  Intake --  Output 500 ml  Net -500 ml    Filed Weights   10/04/19 1029  Weight: 133.8 kg    General appearance: Awake alert.  In no distress Resp: Normal effort at rest.  Few crackles heard bilateral bases.  No wheezing or rhonchi. Cardio: S1-S2 is normal regular.  No S3-S4.  No rubs murmurs or bruit GI: Abdomen is soft.  Nontender nondistended.  Bowel sounds are present normal.  No masses organomegaly Extremities: No edema.  Full range of motion of lower extremities. Neurologic: Alert and oriented x3.  No focal neurological deficits.    Lab Results:  Data Reviewed: I have personally reviewed following labs and imaging studies  CBC: Recent Labs  Lab 10/09/19 0103 10/10/19 0339 10/11/19 1024 10/12/19 0515 10/13/19 0628  WBC 10.0 16.4* 18.9* 16.7* 17.5*  NEUTROABS 7.5 14.4* 17.2* 14.4* 14.7*  HGB 14.6 14.9 15.6* 15.7* 15.6*  HCT 43.9 45.8 47.0* 46.7* 46.1*  MCV 85.1 85.0 85.5 84.9 84.6  PLT 396 431* 515* 546* 485*    Basic Metabolic Panel: Recent Labs  Lab 10/09/19 0103 10/10/19 0339 10/11/19 1024 10/12/19 0515 10/13/19 0628  NA 139 138 138 135 136  K 4.4 4.9 4.2 3.9 4.3  CL 104 101 100 100 99  CO2 27 26 25 22 25   GLUCOSE 183* 254* 222* 166* 136*  BUN 14 15 15 16 17   CREATININE 0.95 0.98 0.94 0.81 0.91  CALCIUM 9.2 9.2 9.3 9.2 9.2  MG 2.6* 2.7* 2.6* 2.5* 2.5*  PHOS 4.1 4.4 2.9 3.7 2.9    GFR: Estimated Creatinine Clearance: 133.1 mL/min (by C-G formula based on SCr of 0.91 mg/dL).  Liver Function Tests: Recent Labs  Lab 10/09/19 0103 10/10/19 0339 10/11/19 1024 10/12/19 0515 10/13/19 0628  AST 18 21 18 18 18   ALT 23 25 23 23 19   ALKPHOS 36* 44 44 45 45  BILITOT 1.1 0.9 1.0 1.3* 1.8*  PROT 7.2 7.6 7.6 7.5 7.5  ALBUMIN 3.7 3.9 4.0 3.5 3.9     CBG: Recent Labs  Lab 10/12/19 1225 10/12/19 1636 10/12/19 2017 10/13/19 0746 10/13/19 1136  GLUCAP 223* 244* 260* 163* 259*     Anemia Panel: Recent Labs    10/11/19 1024  FERRITIN 359*    Recent Results (from the past 240  hour(s))  Blood Culture (routine x 2)     Status: None   Collection Time: 10/04/19 12:01 PM   Specimen: BLOOD RIGHT ARM  Result Value Ref Range Status   Specimen Description BLOOD RIGHT ARM  Final   Special Requests   Final    BOTTLES DRAWN AEROBIC AND ANAEROBIC Blood Culture adequate volume   Culture   Final    NO GROWTH 5 DAYS Performed at [facility], [place] [address] [place], [place] [address]    Report Status 10/09/2019 FINAL  Final  Blood Culture (routine x 2)     Status: None   Collection Time: 10/04/19  1:50 PM   Specimen: BLOOD  Result Value Ref Range Status   Specimen Description BLOOD LEFT PICC LINE  Final   Special Requests   Final    BOTTLES DRAWN AEROBIC AND ANAEROBIC Blood Culture adequate volume   Culture   Final    NO GROWTH 5 DAYS Performed at [facility], [place] [address] [place], [place] [address]    Report Status 10/09/2019 FINAL  Final      Radiology Studies: DG CHEST PORT 1 VIEW  Result Date: 10/13/2019 CLINICAL DATA:  Shortness of breath. EXAM: PORTABLE CHEST 1 VIEW COMPARISON:  October 12, 2019. FINDINGS: Stable cardiomediastinal silhouette. No pneumothorax or pleural effusion is noted. Hypoinflation of the lungs is noted with mild bibasilar subsegmental atelectasis or infiltrates. Bony thorax is unremarkable. IMPRESSION: Hypoinflation of the lungs with mild bibasilar subsegmental atelectasis or infiltrates. Electronically Signed   By: Marijo Conception M.D.   On: 10/13/2019 08:38   DG CHEST PORT 1 VIEW  Result Date: 10/12/2019 CLINICAL DATA:  COVID. EXAM: PORTABLE CHEST 1 VIEW COMPARISON:  10/11/2019 CT neck 10/09/2019. FINDINGS: Pneumomediastinum again noted. Heart size stable. Low lung  volumes with bibasilar pulmonary infiltrates again noted. No pleural effusion. Thorax IMPRESSION: 1.  Pneumomediastinum again noted. 2. Low lung volumes with bibasilar pulmonary infiltrates again noted. No interim change Electronically Signed   By: Marcello Moores  Register    On: 10/12/2019 07:11       LOS: 9 days   [facility] Pager on [email or web address]  10/13/2019, 11:58 AM

## 2019-10-13 NOTE — Plan of Care (Signed)
  Problem: Education: Goal: Knowledge of risk factors and measures for prevention of condition will improve Outcome: Progressing   Problem: Coping: Goal: Psychosocial and spiritual needs will be supported Outcome: Progressing   Problem: Respiratory: Goal: Will maintain a patent airway Outcome: Progressing Goal: Complications related to the disease process, condition or treatment will be avoided or minimized Outcome: Progressing   

## 2019-10-14 LAB — BASIC METABOLIC PANEL
Anion gap: 13 (ref 5–15)
BUN: 18 mg/dL (ref 6–20)
CO2: 24 mmol/L (ref 22–32)
Calcium: 9.5 mg/dL (ref 8.9–10.3)
Chloride: 100 mmol/L (ref 98–111)
Creatinine, Ser: 0.93 mg/dL (ref 0.44–1.00)
GFR calc Af Amer: >60 mL/min (ref >60)
GFR calc non Af Amer: >60 mL/min (ref >60)
Glucose, Bld: 219 mg/dL — ABNORMAL HIGH (ref 70–99)
Potassium: 4.1 mmol/L (ref 3.5–5.1)
Sodium: 137 mmol/L (ref 135–145)

## 2019-10-14 LAB — GLUCOSE, CAPILLARY
Glucose-Capillary: 139 mg/dL — ABNORMAL HIGH (ref 70–99)
Glucose-Capillary: 211 mg/dL — ABNORMAL HIGH (ref 70–99)
Glucose-Capillary: 188 mg/dL — ABNORMAL HIGH (ref 70–99)
Glucose-Capillary: 185 mg/dL — ABNORMAL HIGH (ref 70–99)

## 2019-10-14 MED ORDER — LIVING WELL WITH DIABETES BOOK
Freq: Once | Status: AC
  Filled 2019-10-14: qty 1

## 2019-10-14 MED ORDER — PREDNISONE 20 MG PO TABS
60.0000 mg | ORAL_TABLET | Freq: Every day | ORAL | Status: DC
  Administered 2019-10-15: 60 mg via ORAL
  Filled 2019-10-14: qty 3

## 2019-10-14 MED ORDER — FUROSEMIDE 10 MG/ML IJ SOLN
60.0000 mg | Freq: Once | INTRAMUSCULAR | Status: AC
  Administered 2019-10-14: 60 mg via INTRAVENOUS
  Filled 2019-10-14: qty 6

## 2019-10-14 NOTE — Progress Notes (Signed)
Patient defers O2 qual before dinner, requests senna and miralax as she feels constipated, agrees to [place] later tonight.

## 2019-10-14 NOTE — Progress Notes (Signed)
PROGRESS NOTE  Sarah Calhoun HCW:[id number] DOB: Mar 06, 1995 DOA: 10/04/2019  PCP: Loraine Leriche., MD  Brief History/Interval Summary: 24 y.o.femalewith medical history significant ofhypothyroidism, morbid obesity with BMI of 49 who presented to emergency department with worsening shortness of breath, body ache, fever, chills, generalized weakness and lethargy since 1 week,she became more short of breath came to the hospital and was diagnosed with severe acute hypoxic respiratory failure due to COVID-19 pneumonia and admitted to the hospital.  Reason for Visit: Pneumonia due to COVID-19.  Pneumomediastinum  Consultants: None  Procedures: None  Antibiotics: Anti-infectives (From admission, onward)   Start     Dose/Rate Route Frequency Ordered Stop   10/05/19 1000  remdesivir 100 mg in sodium chloride 0.9 % 100 mL IVPB       "Followed by" Linked Group Details   100 mg 200 mL/hr over 30 Minutes Intravenous Daily 10/04/19 1358 10/08/19 0846   10/04/19 1400  remdesivir 200 mg in sodium chloride 0.9% 250 mL IVPB       "Followed by" Linked Group Details   200 mg 580 mL/hr over 30 Minutes Intravenous Once 10/04/19 1358 10/04/19 1635      Subjective/Interval History: Patient states that she feels better from a breathing standpoint.  Did have some difficulty urinating yesterday.  Still experiencing some burning sensation.  Denies any nausea vomiting.  Otherwise feels well.     Assessment/Plan:  Acute Hypoxic Resp. Failure/Pneumonia due to COVID-19/severe sepsis, present on admission, now resolved   Recent Labs  Lab 10/08/19 0852 10/08/19 0852 10/09/19 0103 10/10/19 0339 10/11/19 1024 10/12/19 0515 10/13/19 0628  DDIMER 1.26*  --  1.34*  --   --   --   --   FERRITIN 270  --  299 316* 359*  --   --   CRP 0.7  --  0.7  --   --   --   --   ALT 25   < > 23 25 23 23 19    < > = values in this interval not displayed.    From a respiratory standpoint patient seems to be  stable.  Currently requiring about 2 to 3 L of oxygen saturating in the early 90s.  Patient has completed course of Remdesivir..  It appears that this has been discontinued.  Will initiate oral steroids and taper gradually.  Inflammatory markers have improved.  Patient is also on baricitinib.  Continue to mobilize.  Out of bed to chair.  Prone positioning as tolerated.  No incentive spirometry due to pneumomediastinum.  Labs are pending from this morning.  Can be given another dose of Lasix depending on labs.  Leukocytosis due to steroids.    The treatment plan and use of medications and known side effects were discussed with patient/family. Some of the medications used are based on case reports/anecdotal data.  All other medications being used in the management of COVID-19 based on limited study data.  Complete risks and long-term side effects are unknown, however in the best clinical judgment they seem to be of some benefit.  Patient/family wanted to proceed with treatment options provided.  Questionable dysuria UA was checked and there is no evidence for infection.  Reason for her symptoms not entirely clear.  She is reassured.  Mobilize.  Outpatient evaluation if symptoms persist after 1 to 2 weeks.  Pneumomediastinum This was detected on chest x-ray done on 8/30.  Chest x-ray report from 9/1 did not suggest any pneumomediastinum.  We will repeat it  tomorrow.  Briefly discussed with pulmonology who did not recommend any interventions.  Hypothyroidism Continue home medications.  TSH was noted to be elevated at 8.3 but free T4 was normal at 1.12.  Can be pursued in the outpatient setting.  Thrombocytosis Stable.  Abnormal LFTs Continue to monitor periodically.  New onset diabetes mellitus type 2 HbA1c 6.6.  Elevated CBGs most likely due to steroids.  Will need definitive management in the outpatient setting.  History of GERD PPI  Constipation Improved with bowel regimen.  Morbid  obesity Estimated body mass index is 49.09 kg/m as calculated from the following:   Height as of this encounter: 5\' 5"  (1.651 m).   Weight as of this encounter: 133.8 kg.   DVT Prophylaxis: Lovenox Code Status: Full code Family Communication: Discussed with the patient Disposition Plan: Hopefully return home when improved  Status is: Inpatient  Remains inpatient appropriate because:IV treatments appropriate due to intensity of illness or inability to take PO and Inpatient level of care appropriate due to severity of illness   Dispo:  Patient From: Home  Planned Disposition: Home  Expected discharge date: 10/15/19  Medically stable for discharge: No      Medications:  Scheduled: . vitamin C  500 mg Oral Daily  . baricitinib  4 mg Oral Daily  . calcium carbonate  1 tablet Oral BID  . enoxaparin (LOVENOX) injection  65 mg Subcutaneous Q24H  . insulin aspart  0-15 Units Subcutaneous TID WC  . insulin aspart  0-5 Units Subcutaneous QHS  . Ipratropium-Albuterol  1 puff Inhalation Q6H  . levothyroxine  200 mcg Oral QAC breakfast  . pantoprazole  40 mg Oral BID  . polyethylene glycol  17 g Oral BID  . senna-docusate  1 tablet Oral BID  . sodium chloride flush  10-40 mL Intracatheter Q12H  . zinc sulfate  220 mg Oral Daily   Continuous:  JGG:[id number], albuterol, chlorpheniramine-HYDROcodone, guaiFENesin-dextromethorphan, hydrALAZINE, menthol-cetylpyridinium, [DISCONTINUED] ondansetron **OR** ondansetron (ZOFRAN) IV, phenol, sodium chloride flush, traMADol   Objective:  Vital Signs  Vitals:   10/13/19 0809 10/13/19 1618 10/13/19 2034 10/14/19 0719  BP: (!) 153/109 (!) 123/96 (!) 123/102 136/90  Pulse: (!) 109 (!) 116 100 99  Resp: 20 (!) 21 20 20   Temp: 97.8 F (36.6 C) 98.5 F (36.9 C) 97.9 F (36.6 C) 98.6 F (37 C)  TempSrc:   Oral   SpO2: 90% 93% 93% 92%  Weight:      Height:        Intake/Output Summary (Last 24 hours) at 10/14/2019 1156 Last data  filed at 10/13/2019 2054 Gross per 24 hour  Intake 240 ml  Output 2000 ml  Net -1760 ml   Filed Weights   10/04/19 1029  Weight: 133.8 kg    General appearance: Awake alert.  In no distress Resp: Normal effort at rest.  Few crackles bilateral bases.  No wheezing or rhonchi.   Cardio: S1-S2 is normal regular.  No S3-S4.  No rubs murmurs or bruit GI: Abdomen is soft.  Nontender nondistended.  Bowel sounds are present normal.  No masses organomegaly Extremities: No edema.  Full range of motion of lower extremities. Neurologic: Alert and oriented x3.  No focal neurological deficits.   Lab Results:  Data Reviewed: I have personally reviewed following labs and imaging studies  CBC: Recent Labs  Lab 10/09/19 0103 10/10/19 0339 10/11/19 1024 10/12/19 0515 10/13/19 0628  WBC 10.0 16.4* 18.9* 16.7* 17.5*  NEUTROABS 7.5 14.4* 17.2* 14.4* 14.7*  HGB 14.6 14.9 15.6* 15.7* 15.6*  HCT 43.9 45.8 47.0* 46.7* 46.1*  MCV 85.1 85.0 85.5 84.9 84.6  PLT 396 431* 515* 546* 485*    Basic Metabolic Panel: Recent Labs  Lab 10/09/19 0103 10/10/19 0339 10/11/19 1024 10/12/19 0515 10/13/19 0628  NA 139 138 138 135 136  K 4.4 4.9 4.2 3.9 4.3  CL 104 101 100 100 99  CO2 27 26 25 22 25   GLUCOSE 183* 254* 222* 166* 136*  BUN 14 15 15 16 17   CREATININE 0.95 0.98 0.94 0.81 0.91  CALCIUM 9.2 9.2 9.3 9.2 9.2  MG 2.6* 2.7* 2.6* 2.5* 2.5*  PHOS 4.1 4.4 2.9 3.7 2.9    GFR: Estimated Creatinine Clearance: 133.1 mL/min (by C-G formula based on SCr of 0.91 mg/dL).  Liver Function Tests: Recent Labs  Lab 10/09/19 0103 10/10/19 0339 10/11/19 1024 10/12/19 0515 10/13/19 0628  AST 18 21 18 18 18   ALT 23 25 23 23 19   ALKPHOS 36* 44 44 45 45  BILITOT 1.1 0.9 1.0 1.3* 1.8*  PROT 7.2 7.6 7.6 7.5 7.5  ALBUMIN 3.7 3.9 4.0 3.5 3.9     CBG: Recent Labs  Lab 10/13/19 0746 10/13/19 1136 10/13/19 1617 10/13/19 2038 10/14/19 0718  GLUCAP 163* 259* 310* 232* 139*      Recent Results  (from the past 240 hour(s))  Blood Culture (routine x 2)     Status: None   Collection Time: 10/04/19 12:01 PM   Specimen: BLOOD RIGHT ARM  Result Value Ref Range Status   Specimen Description BLOOD RIGHT ARM  Final   Special Requests   Final    BOTTLES DRAWN AEROBIC AND ANAEROBIC Blood Culture adequate volume   Culture   Final    NO GROWTH 5 DAYS Performed at [facility], [place] [address] [place], [place] [address]    Report Status 10/09/2019 FINAL  Final  Blood Culture (routine x 2)     Status: None   Collection Time: 10/04/19  1:50 PM   Specimen: BLOOD  Result Value Ref Range Status   Specimen Description BLOOD LEFT PICC LINE  Final   Special Requests   Final    BOTTLES DRAWN AEROBIC AND ANAEROBIC Blood Culture adequate volume   Culture   Final    NO GROWTH 5 DAYS Performed at [facility], [place] [address] [place], [place] [address]    Report Status 10/09/2019 FINAL  Final      Radiology Studies: DG CHEST PORT 1 VIEW  Result Date: 10/13/2019 CLINICAL DATA:  Shortness of breath. EXAM: PORTABLE CHEST 1 VIEW COMPARISON:  October 12, 2019. FINDINGS: Stable cardiomediastinal silhouette. No pneumothorax or pleural effusion is noted. Hypoinflation of the lungs is noted with mild bibasilar subsegmental atelectasis or infiltrates. Bony thorax is unremarkable. IMPRESSION: Hypoinflation of the lungs with mild bibasilar subsegmental atelectasis or infiltrates. Electronically Signed   By: Marijo Conception M.D.   On: 10/13/2019 08:38       LOS: 10 days   [facility] Pager on [email or web address]  10/14/2019, 11:56 AM

## 2019-10-14 NOTE — Plan of Care (Signed)
  Problem: Education: Goal: Knowledge of risk factors and measures for prevention of condition will improve Outcome: Progressing   Problem: Coping: Goal: Psychosocial and spiritual needs will be supported Outcome: Progressing   Problem: Respiratory: Goal: Will maintain a patent airway Outcome: Progressing Goal: Complications related to the disease process, condition or treatment will be avoided or minimized Outcome: Progressing   

## 2019-10-14 NOTE — Plan of Care (Signed)
  Problem: Education: Goal: Knowledge of risk factors and measures for prevention of condition will improve Outcome: Progressing   Problem: Respiratory: Goal: Complications related to the disease process, condition or treatment will be avoided or minimized Outcome: Progressing    Problem: Coping: Goal: Psychosocial and spiritual needs will be supported Outcome: Progressing  Extensive education provided regarding treatment plan (ambulation, o2 qual to eval need for home o2, eval for home diabetes mgmt), reviewed diabetes guide with patient.

## 2019-10-15 ENCOUNTER — Inpatient Hospital Stay (HOSPITAL_COMMUNITY): Payer: BC Managed Care – PPO

## 2019-10-15 LAB — GLUCOSE, CAPILLARY
Glucose-Capillary: 131 mg/dL — ABNORMAL HIGH (ref 70–99)
Glucose-Capillary: 187 mg/dL — ABNORMAL HIGH (ref 70–99)

## 2019-10-15 MED ORDER — HYDROCOD POLST-CPM POLST ER 10-8 MG/5ML PO SUER
5.0000 mL | Freq: Two times a day (BID) | ORAL | 0 refills | Status: DC | PRN
Start: 2019-10-15 — End: 2020-05-05

## 2019-10-15 MED ORDER — PANTOPRAZOLE SODIUM 40 MG PO TBEC: 40.0000 mg | DELAYED_RELEASE_TABLET | Freq: Two times a day (BID) | ORAL | 0 refills | Status: DC

## 2019-10-15 MED ORDER — PREDNISONE 20 MG PO TABS: ORAL_TABLET | ORAL | 0 refills | Status: DC

## 2019-10-15 MED ORDER — POLYETHYLENE GLYCOL 3350 17 G PO PACK: 17.0000 g | PACK | Freq: Every day | ORAL | 0 refills | Status: DC | PRN

## 2019-10-15 MED ORDER — FUROSEMIDE 20 MG PO TABS: 20.0000 mg | ORAL_TABLET | Freq: Every day | ORAL | 0 refills | Status: DC

## 2019-10-15 NOTE — Progress Notes (Signed)
Nsg Discharge Note  Admit Date:  10/04/2019 Discharge date: 10/15/2019   Pammy Losee to be D/C'd Home per MD order.  AVS completed.  Patient/caregiver able to verbalize understanding.  Discharge Medication: Allergies as of 10/15/2019   No Known Allergies     Medication List    STOP taking these medications   dexamethasone 6 MG tablet Commonly known as: DECADRON     TAKE these medications   acetaminophen 500 MG tablet Commonly known as: TYLENOL Take 500-1,000 mg by mouth every 6 (six) hours as needed for mild pain, fever or headache.   albuterol 108 (90 Base) MCG/ACT inhaler Commonly known as: VENTOLIN HFA Inhale 1-2 puffs into the lungs every 6 (six) hours as needed for wheezing or shortness of breath.   benzonatate 100 MG capsule Commonly known as: TESSALON Take 1 capsule (100 mg total) by mouth every 8 (eight) hours. Notes to patient: Tonight, 10/15/19.   chlorpheniramine-HYDROcodone 10-8 MG/5ML Suer Commonly known as: TUSSIONEX Take 5 mLs by mouth every 12 (twelve) hours as needed for cough.   fluticasone 50 MCG/ACT nasal spray Commonly known as: FLONASE Place 2 sprays into both nostrils daily. Notes to patient: Tomorrow, 10/16/19.   furosemide 20 MG tablet Commonly known as: Lasix Take 1 tablet (20 mg total) by mouth daily for 7 days. Notes to patient: Tonight, 10/15/19.   ibuprofen 200 MG tablet Commonly known as: ADVIL Take 200-400 mg by mouth every 6 (six) hours as needed for fever, headache or mild pain.   levothyroxine 200 MCG tablet Commonly known as: SYNTHROID Take 200 mcg by mouth daily before breakfast. Notes to patient: Tomorrow, 10/16/19.   ondansetron 4 MG disintegrating tablet Commonly known as: Zofran ODT Take 1 tablet (4 mg total) by mouth every 8 (eight) hours as needed for nausea or vomiting. What changed: reasons to take this   oxyCODONE-acetaminophen 5-325 MG tablet Commonly known as: PERCOCET/ROXICET Take 1 tablet by mouth every 6 (six) hours  as needed for severe pain.   pantoprazole 40 MG tablet Commonly known as: PROTONIX Take 1 tablet (40 mg total) by mouth 2 (two) times daily for 14 days. Notes to patient: Tonight, 10/15/19.   polyethylene glycol 17 g packet Commonly known as: MIRALAX / GLYCOLAX Take 17 g by mouth daily as needed.   predniSONE 20 MG tablet Commonly known as: DELTASONE Take 3 tablets once daily for 3 days followed by 2 tablets once daily for 3 days followed by 1 tablet once daily for 3 days and then stop Notes to patient: Tomorrow, 10/16/19.   ZyrTEC Allergy 10 MG tablet Generic drug: cetirizine Take 10 mg by mouth daily as needed for allergies or rhinitis.            Durable Medical Equipment  (From admission, onward)         Start     Ordered   10/15/19 1224  For home use only DME oxygen  Once       Question Answer Comment  Length of Need 6 Months   Mode or (Route) Nasal cannula   Liters per Minute 3   Frequency Continuous (stationary and portable oxygen unit needed)   Oxygen conserving device Yes   Oxygen delivery system Gas      10/15/19 1223          Discharge Assessment: Vitals:   10/15/19 1445 10/15/19 1453  BP:    Pulse:    Resp: 18 18  Temp:    SpO2:  Skin clean, dry and intact without evidence of skin break down, no evidence of skin tears noted. IV catheter discontinued intact. Site without signs and symptoms of complications - no redness or edema noted at insertion site, patient denies c/o pain - only slight tenderness at site.  Dressing with slight pressure applied.  D/c Instructions-Education: Discharge instructions given to patient/family with verbalized understanding. Discussed and demonstrated O2 usage and safety with patient. Gave contact info for O2 company. D/c education completed with patient/family including follow up instructions, medication list, d/c activities limitations if indicated, with other d/c instructions as indicated by MD - patient able to  verbalize understanding, all questions fully answered. Patient instructed to return to ED, call 911, or call MD for any changes in condition.  Patient escorted via [place], and D/C home via private auto.  Joni Reining, RN 10/15/2019 6:13 PM

## 2019-10-15 NOTE — Progress Notes (Signed)
Pt having tele issues per shift. Replaced tele leads and wires.

## 2019-10-15 NOTE — Discharge Instructions (Signed)
Foods with carbohydrates make your blood glucose level go up. Learning how to count carbohydrates can help you control your blood glucose levels. First, identify the foods you eat that contain carbohydrates. Then, using the Foods with Carbohydrates chart, determine about how much carbohydrates are in your meals and snacks. Make sure you are eating foods with fiber, protein, and healthy fat along with your carbohydrate foods. Foods with Carbohydrates  Grains . 1 slice bread (1 ounce)  . 1 small tortilla (6-inch size)  .  large bagel (1 ounce)  . 1/3 cup pasta or rice (cooked)  .  hamburger or hot dog bun ( ounce)  .  cup cooked cereal  .  to  cup ready-to-eat cereal  . 2 taco shells (5-inch size) Fruit . 1 small fresh fruit ( to 1 cup)  .  medium banana  . 17 small grapes (3 ounces)  . 1 cup melon or berries  .  cup canned or frozen fruit  . 2 tablespoons dried fruit (blueberries, cherries, cranberries, raisins)  .  cup unsweetened fruit juice  Starchy Vegetables .  cup cooked beans, peas, corn, potatoes/sweet potatoes  .  large baked potato (3 ounces)  . 1 cup acorn or butternut squash  Snack Foods . 3 to 6 crackers  . 8 potato chips or 13 tortilla chips ( ounce to 1 ounce)  . 3 cups popped popcorn  Dairy . 3/4 cup (6 ounces) nonfat plain yogurt, or yogurt with sugar-free sweetener  . 1 cup milk  . 1 cup plain rice, soy, coconut or flavored almond milk Sweets and Desserts .  cup ice cream or frozen yogurt  . 1 tablespoon jam, jelly, pancake syrup, table sugar, or honey  . 2 tablespoons light pancake syrup  . 1 inch square of frosted cake or 2 inch square of unfrosted cake  . 2 small cookies (2/3 ounce each) or  large cookie  Sometimes you'll have to estimate carbohydrate amounts if you don't know the exact recipe. One cup of mixed foods like soups can have 1 to 2 carbohydrate servings, while some casseroles might have 2 or more servings of carbohydrate. Foods that  have less than 20 calories in each serving can be counted as "free" foods. Count 1 cup raw vegetables, or  cup cooked non-starchy vegetables as "free" foods. If you eat 3 or more servings at one meal, then count them as 1 carbohydrate serving.  Foods without Carbohydrates  Not all foods contain carbohydrates. Meat, some dairy, fats, non-starchy vegetables, and many beverages don't contain carbohydrate. So when you count carbohydrates, you can generally exclude chicken, pork, beef, fish, seafood, eggs, tofu, cheese, butter, sour cream, avocado, nuts, seeds, olives, mayonnaise, water, black coffee, unsweetened tea, and zero-calorie drinks. Vegetables with no or low carbohydrate include green beans, cauliflower, tomatoes, and onions.  Tips How should I plan my meals?  Plan for half the food on your plate to include non-starchy vegetables, like salad greens, broccoli, or carrots. Try to eat 3 to 5 servings of non-starchy vegetables every day. Have a protein food at each meal. Protein foods include chicken, fish, meat, eggs, or beans (note that beans contain carbohydrate). These two food groups (non-starchy vegetables and proteins) are low in carbohydrate. If you fill up your plate with these foods, you will eat less carbohydrate but still fill up your stomach. Try to limit your carbohydrate portion to  of the plate.  What fats are healthiest to eat?  Diabetes increases risk  for heart disease. To help protect your heart, eat more healthy fats, such as olive oil, nuts, and avocado. Eat less saturated fats like butter, cream, and high-fat meats, like bacon and sausage. Avoid trans fats, which are in all foods that list "partially hydrogenated oil" as an ingredient. What should I drink?  Choose drinks that are not sweetened with sugar. The healthiest choices are water, carbonated or seltzer waters, and tea and coffee without added sugars.  Sweet drinks will make your blood glucose go up very quickly. One  serving of soda or energy drink is  cup. It is best to drink these beverages only if your blood glucose is low.  Artificially sweetened, or diet drinks, typically do not increase your blood glucose if they have zero calories in them. Read labels of beverages, as some diet drinks do have carbohydrate and will raise your blood glucose. Label Reading Tips Read Nutrition Facts labels to find out how many grams of carbohydrate are in a food you want to eat. Don't forget: sometimes serving sizes on the label aren't the same as how much food you are going to eat, so you may need to calculate how much carbohydrate is in the food you are serving yourself.    Breakfast  cup yogurt, low fat, low sugar (1 carbohydrate serving)   cup cereal, ready-to-eat, unsweetened (1 carbohydrate serving)  1 cup strawberries (1 carbohydrate serving)   cup almonds ( carbohydrate serving)  Lunch 1, 5 ounce can chunk light tuna  2 ounces cheese, low fat cheddar  6 whole wheat crackers (1 carbohydrate serving)  1 small apple (1 carbohydrate servings)   cup carrots ( carbohydrate serving)   cup snap peas  1 cup 1% milk (1 carbohydrate serving)   Evening Meal Stir fry made with: 3 ounces chicken  1 cup brown rice (3 carbohydrate servings)   cup broccoli ( carbohydrate serving)   cup green beans   cup onions  1 tablespoon olive oil  2 tablespoons teriyaki sauce ( carbohydrate serving)  Evening Snack 1 extra small banana (1 carbohydrate serving)  1 tablespoon peanut butter     Breakfast 1 cup cooked oatmeal (2 carbohydrate servings)   cup blueberries (1 carbohydrate serving)  2 tablespoons flaxseeds  1 cup soymilk fortified with calcium and vitamin D  1 cup coffee  Lunch 2 slices whole wheat bread (2 carbohydrate servings)   cup baked tofu   cup lettuce  2 slices tomato  2 slices avocado   cup baby carrots ( carbohydrate serving)  1 orange (1 carbohydrate serving)  1 cup soymilk fortified  with calcium and vitamin D   Evening Meal Burrito made with: 1 6-inch corn tortilla (1 carbohydrate serving)  1 cup refried vegetarian beans (2 carbohydrate servings)   cup chopped tomatoes   cup lettuce   cup salsa  1/3 cup brown rice (1 carbohydrate serving)  1 tablespoon olive oil for rice   cup zucchini   Evening Snack 6 small whole grain crackers (1 carbohydrate serving)  2 apricots ( carbohydrate serving)   cup unsalted peanuts ( carbohydrate serving)     Breakfast 1 cup cooked oatmeal (2 carbohydrate servings)   cup blueberries (1 carbohydrate serving)  2 tablespoons flaxseeds  1 egg  1 cup 1% milk (1 carbohydrate serving)  1 cup coffee  Lunch 2 slices whole wheat bread (2 carbohydrate servings)  2 ounces low-fat cheese   cup lettuce  2 slices tomato  2 slices avocado  cup baby carrots ( carbohydrate serving)  1 orange (1 carbohydrate serving)  1 cup unsweetened tea  Evening Meal Burrito made with: 1 6-inch corn tortilla (1 carbohydrate serving)   cup refried vegetarian beans (1 carbohydrate serving)   cup tomatoes   cup lettuce   cup salsa  1/3 cup brown rice (1 carbohydrate serving)  1 tablespoon olive oil for rice   cup zucchini  1 cup 1% milk (1 carbohydrate serving)  Evening Snack 6 small whole grain crackers (1 carbohydrate serving)  2 apricots ( carbohydrate serving)   cup unsalted peanuts ( carbohydrate serving)        COVID-19 Frequently Asked Questions COVID-19 (coronavirus disease) is an infection that is caused by a large family of viruses. Some viruses cause illness in people and others cause illness in animals like camels, cats, and bats. In some cases, the viruses that cause illness in animals can spread to humans. Where did the coronavirus come from? In December 2019, [place] told the [facility]) of several cases of lung disease (human respiratory illness). These cases were linked to an open seafood and  livestock market in the city of [place]. The link to the seafood and livestock market suggests that the virus may have spread from animals to humans. However, since that first outbreak in December, the virus has also been shown to spread from person to person. What is the name of the disease and the virus? Disease name Early on, this disease was called novel coronavirus. This is because scientists determined that the disease was caused by a new (novel) respiratory virus. The World Health Organization [facility]) has now named the disease COVID-19, or coronavirus disease. Virus name The virus that causes the disease is called severe acute respiratory syndrome coronavirus 2 (SARS-CoV-2). More information on disease and virus naming World Health Organization [facility]): www.who.int/emergencies/diseases/novel-coronavirus-2019/technical-guidance/naming-the-coronavirus-disease-(covid-2019)-and-the-virus-that-causes-it Who is at risk for complications from coronavirus disease? Some people may be at higher risk for complications from coronavirus disease. This includes older adults and people who have chronic diseases, such as heart disease, diabetes, and lung disease. If you are at higher risk for complications, take these extra precautions:  Stay home as much as possible.  Avoid social gatherings and travel.  Avoid close contact with others. Stay at least 6 ft (2 m) away from others, if possible.  Wash your hands often with soap and water for at least 20 seconds.  Avoid touching your face, mouth, nose, or eyes.  Keep supplies on hand at home, such as food, medicine, and cleaning supplies.  If you must go out in public, wear a cloth face covering or face mask. Make sure your mask covers your nose and mouth. How does coronavirus disease spread? The virus that causes coronavirus disease spreads easily from person to person (is contagious). You may catch the virus by:  Breathing in droplets from an infected  person. Droplets can be spread by a person breathing, speaking, singing, coughing, or sneezing.  Touching something, like a table or a doorknob, that was exposed to the virus (contaminated) and then touching your mouth, nose, or eyes. Can I get the virus from touching surfaces or objects? There is still a lot that we do not know about the virus that causes coronavirus disease. Scientists are basing a lot of information on what they know about similar viruses, such as:  Viruses cannot generally survive on surfaces for long. They need a human body (host) to survive.  It is more likely that  the virus is spread by close contact with people who are sick (direct contact), such as through: ? Shaking hands or hugging. ? Breathing in respiratory droplets that travel through the air. Droplets can be spread by a person breathing, speaking, singing, coughing, or sneezing.  It is less likely that the virus is spread when a person touches a surface or object that has the virus on it (indirect contact). The virus may be able to enter the body if the person touches a surface or object and then touches his or her face, eyes, nose, or mouth. Can a person spread the virus without having symptoms of the disease? It may be possible for the virus to spread before a person has symptoms of the disease, but this is most likely not the main way the virus is spreading. It is more likely for the virus to spread by being in close contact with people who are sick and breathing in the respiratory droplets spread by a person breathing, speaking, singing, coughing, or sneezing. What are the symptoms of coronavirus disease? Symptoms vary from person to person and can range from mild to severe. Symptoms may include:  Fever or chills.  Cough.  Difficulty breathing or feeling short of breath.  Headaches, body aches, or muscle aches.  Runny or stuffy (congested) nose.  Sore throat.  New loss of taste or smell.  Nausea,  vomiting, or diarrhea. These symptoms can appear anywhere from 2 to 14 days after you have been exposed to the virus. Some people may not have any symptoms. If you develop symptoms, call your health care provider. People with severe symptoms may need hospital care. Should I be tested for this virus? Your health care provider will decide whether to test you based on your symptoms, history of exposure, and your risk factors. How does a health care provider test for this virus? Health care providers will collect samples to send for testing. Samples may include:  Taking a swab of fluid from the back of your nose and throat, your nose, or your throat.  Taking fluid from the lungs by having you cough up mucus (sputum) into a sterile cup.  Taking a blood sample. Is there a treatment or vaccine for this virus? Currently, there is no vaccine to prevent coronavirus disease. Also, there are no medicines like antibiotics or antivirals to treat the virus. A person who becomes sick is given supportive care, which means rest and fluids. A person may also relieve his or her symptoms by using over-the-counter medicines that treat sneezing, coughing, and runny nose. These are the same medicines that a person takes for the common cold. If you develop symptoms, call your health care provider. People with severe symptoms may need hospital care. What can I do to protect myself and my family from this virus?     You can protect yourself and your family by taking the same actions that you would take to prevent the spread of other viruses. Take the following actions:  Wash your hands often with soap and water for at least 20 seconds. If soap and water are not available, use alcohol-based hand sanitizer.  Avoid touching your face, mouth, nose, or eyes.  Cough or sneeze into a tissue, sleeve, or elbow. Do not cough or sneeze into your hand or the air. ? If you cough or sneeze into a tissue, throw it away immediately  and wash your hands.  Disinfect objects and surfaces that you frequently touch every day.  Stay away from people who are sick.  Avoid going out in public, follow guidance from your state and local health authorities.  Avoid crowded indoor spaces. Stay at least 6 ft (2 m) away from others.  If you must go out in public, wear a cloth face covering or face mask. Make sure your mask covers your nose and mouth.  Stay home if you are sick, except to get medical care. Call your health care provider before you get medical care. Your health care provider will tell you how long to stay home.  Make sure your vaccines are up to date. Ask your health care provider what vaccines you need. What should I do if I need to travel? Follow travel recommendations from your local health authority, the CDC, and WHO. Travel information and advice  Centers for Disease Control and Prevention (CDC): BodyEditor.hu  World Health Organization [facility]): ThirdIncome.ca Know the risks and take action to protect your health  You are at higher risk of getting coronavirus disease if you are traveling to areas with an outbreak or if you are exposed to travelers from areas with an outbreak.  Wash your hands often and practice good hygiene to lower the risk of catching or spreading the virus. What should I do if I am sick? General instructions to stop the spread of infection  Wash your hands often with soap and water for at least 20 seconds. If soap and water are not available, use alcohol-based hand sanitizer.  Cough or sneeze into a tissue, sleeve, or elbow. Do not cough or sneeze into your hand or the air.  If you cough or sneeze into a tissue, throw it away immediately and wash your hands.  Stay home unless you must get medical care. Call your health care provider or local health authority before you get medical  care.  Avoid public areas. Do not take public transportation, if possible.  If you can, wear a mask if you must go out of the house or if you are in close contact with someone who is not sick. Make sure your mask covers your nose and mouth. Keep your home clean  Disinfect objects and surfaces that are frequently touched every day. This may include: ? Counters and tables. ? Doorknobs and light switches. ? Sinks and faucets. ? Electronics such as phones, remote controls, keyboards, computers, and tablets.  Wash dishes in hot, soapy water or use a dishwasher. Air-dry your dishes.  Wash laundry in hot water. Prevent infecting other household members  Let healthy household members care for children and pets, if possible. If you have to care for children or pets, wash your hands often and wear a mask.  Sleep in a different bedroom or bed, if possible.  Do not share personal items, such as razors, toothbrushes, deodorant, combs, brushes, towels, and washcloths. Where to find more information Centers for Disease Control and Prevention (CDC)  Information and news updates: https://www.butler-gonzalez.com/ World Health Organization [facility])  Information and news updates: MissExecutive.com.ee  Coronavirus health topic: https://www.castaneda.info/  Questions and answers on COVID-19: OpportunityDebt.at  Global tracker: who.sprinklr.com American Academy of Pediatrics (AAP)  Information for families: www.healthychildren.org/English/health-issues/conditions/chest-lungs/Pages/2019-Novel-Coronavirus.aspx The coronavirus situation is changing rapidly. Check your local health authority website or the CDC and [facility] websites for updates and news. When should I contact a health care provider?  Contact your health care provider if you have symptoms of an infection, such as fever or cough, and you: ? Have been near anyone who  is known  to have coronavirus disease. ? Have come into contact with a person who is suspected to have coronavirus disease. ? Have traveled to an area where there is an outbreak of COVID-19. When should I get emergency medical care?  Get help right away by calling your local emergency services (911 in the [place] if you have: ? Trouble breathing. ? Pain or pressure in your chest. ? Confusion. ? Blue-tinged lips and fingernails. ? Difficulty waking from sleep. ? Symptoms that get worse. Let the emergency medical personnel know if you think you have coronavirus disease. Summary  A new respiratory virus is spreading from person to person and causing COVID-19 (coronavirus disease).  The virus that causes COVID-19 appears to spread easily. It spreads from one person to another through droplets from breathing, speaking, singing, coughing, or sneezing.  Older adults and those with chronic diseases are at higher risk of disease. If you are at higher risk for complications, take extra precautions.  There is currently no vaccine to prevent coronavirus disease. There are no medicines, such as antibiotics or antivirals, to treat the virus.  You can protect yourself and your family by washing your hands often, avoiding touching your face, and covering your coughs and sneezes. This information is not intended to replace advice given to you by your health care provider. Make sure you discuss any questions you have with your health care provider. Document Revised: 11/27/2018 Document Reviewed: 05/26/2018 Elsevier Patient Education  [place].  COVID-19: How to Protect Yourself and Others Know how it spreads  There is currently no vaccine to prevent coronavirus disease 2019 (COVID-19).  The best way to prevent illness is to avoid being exposed to this virus.  The virus is thought to spread mainly from person-to-person. ? Between people who are in close contact with one another (within about  6 feet). ? Through respiratory droplets produced when an infected person coughs, sneezes or talks. ? These droplets can land in the mouths or noses of people who are nearby or possibly be inhaled into the lungs. ? COVID-19 may be spread by people who are not showing symptoms. Everyone should Clean your hands often  Wash your hands often with soap and water for at least 20 seconds especially after you have been in a public place, or after blowing your nose, coughing, or sneezing.  If soap and water are not readily available, use a hand sanitizer that contains at least 60% alcohol. Cover all surfaces of your hands and rub them together until they feel dry.  Avoid touching your eyes, nose, and mouth with unwashed hands. Avoid close contact  Limit contact with others as much as possible.  Avoid close contact with people who are sick.  Put distance between yourself and other people. ? Remember that some people without symptoms may be able to spread virus. ? This is especially important for people who are at higher risk of getting very GainPain.com.cy Cover your mouth and nose with a mask when around others  You could spread COVID-19 to others even if you do not feel sick.  Everyone should wear a mask in public settings and when around people not living in their household, especially when social distancing is difficult to maintain. ? Masks should not be placed on young children under age 59, anyone who has trouble breathing, or is unconscious, incapacitated or otherwise unable to remove the mask without assistance.  The mask is meant to protect other people in case you are infected.  Do NOT use a facemask meant for a Dietitian.  Continue to keep about 6 feet between yourself and others. The mask is not a substitute for social distancing. Cover coughs and sneezes  Always cover your mouth and nose with a tissue  when you cough or sneeze or use the inside of your elbow.  Throw used tissues in the trash.  Immediately wash your hands with soap and water for at least 20 seconds. If soap and water are not readily available, clean your hands with a hand sanitizer that contains at least 60% alcohol. Clean and disinfect  Clean AND disinfect frequently touched surfaces daily. This includes tables, doorknobs, light switches, countertops, handles, desks, phones, keyboards, toilets, faucets, and sinks. RackRewards.fr  If surfaces are dirty, clean them: Use detergent or soap and water prior to disinfection.  Then, use a household disinfectant. You can see a list of EPA-registered household disinfectants here. michellinders.com 10/14/2018 This information is not intended to replace advice given to you by your health care provider. Make sure you discuss any questions you have with your health care provider. Document Revised: 10/22/2018 Document Reviewed: 08/20/2018 Elsevier Patient Education  [place].

## 2019-10-15 NOTE — TOC Transition Note (Signed)
Transition of Care [facility]) - CM/SW Discharge Note   Patient Details  Name: Sarah Calhoun MRN: [id number] Date of Birth: Apr 12, 1995  [facility] of [facility]) CM/SW Contact:  Angelita Ingles, RN Phone Number: [phone]  10/15/2019, 12:57 PM   Clinical Narrative:    CM consulted for patient discharging home on O2. O2 order has been called to [facility]. Portable tank to be delivered to room and  Home delivery to be scheduled per [place]. No further needs noted at this time. CM will sign off.     Final next level of care: Home/Self Care Barriers to Discharge: No Barriers Identified   Patient Goals and CMS Choice   CMS Medicare.gov Compare Post Acute Care list provided to:: Patient Choice offered to / list presented to : Patient  Discharge Placement                       Discharge Plan and Services                DME Arranged: Oxygen DME Agency: [facility] Date DME Agency Contacted: 10/15/19 Time DME Agency Contacted: [phone] Representative spoke with at DME Agency: [place]: NA Walker Agency: NA        Social Determinants of Health ([place]) Interventions     Readmission Risk Interventions No flowsheet data found.

## 2019-10-15 NOTE — Plan of Care (Signed)

## 2019-10-15 NOTE — Discharge Summary (Signed)
[facility]  Physician Discharge Summary   Patient ID: Sarah Calhoun MRN: [id number] DOB/AGE: 17-Oct-1995 24 y.o.  Admit date: 10/04/2019 Discharge date: 10/15/2019  PCP: Loraine Leriche., MD  DISCHARGE DIAGNOSES:  Pneumonia due to COVID-19 Acute respiratory failure with hypoxia Pneumomediastinum Hypothyroidism Abnormal LFTs New onset diabetes mellitus type 2 History of GERD Morbid obesity  RECOMMENDATIONS FOR OUTPATIENT FOLLOW UP: 1. Ambulatory referral sent to pulmonology 2. Home oxygen 3. Outpatient management of newly diagnosed diabetes.    Home Health: None Equipment/Devices: Home oxygen  CODE STATUS: Full code  DISCHARGE CONDITION: fair  Diet recommendation: Modified carbohydrate  INITIAL HISTORY: 24 y.o.femalewith medical history significant ofhypothyroidism, morbid obesity with BMI of 49 who presented to emergency department with worsening shortness of breath, body ache, fever, chills, generalized weakness and lethargy since 1 week,she became more short of breath came to the hospital and was diagnosed with severe acute hypoxic respiratory failure due to COVID-19 pneumonia and admitted to the hospital.   HOSPITAL COURSE:   Acute Hypoxic Resp. Failure/Pneumonia due to COVID-19/severe sepsis, present on admission, now resolved Patient was hospitalized placed on Remdesivir steroids and baricitinib.  She was initially requiring high amounts of oxygen at 20 L/min.  She slowly started improving.  Inflammatory markers improved.  Oxygen requirements went down.  Currently requiring about 2 to 3 L of oxygen.  She feels much better.  She wants to go home today. She is completed course of Remdesivir.  She will be discharged on tapering doses of steroids.  She has been ambulating.  She will need home oxygen due to desaturation with ambulation.  Leukocytosis is due to steroids.  Patient was also given Lasix with good diuresis and improvement in her symptoms.  She  will be discharged on a few days of oral Lasix.  The treatment plan and use of medications and known side effects were discussed with patient/family. Some of the medications used are based on case reports/anecdotal data.  All other medications being used in the management of COVID-19 based on limited study data.  Complete risks and long-term side effects are unknown, however in the best clinical judgment they seem to be of some benefit.  Patient/family wanted to proceed with treatment options provided.  Questionable dysuria UA was checked and there is no evidence for infection.  Reason for her symptoms not entirely clear.  She is reassured.  Outpatient evaluation if symptoms persist after 1 to 2 weeks.  Pneumomediastinum This was noted on imaging studies.  Discussed with pulmonology who did not recommend any intervention especially considering that the patient was improving.  Ambulatory referral sent to pulmonology for outpatient follow-up.    Hypothyroidism Continue home medications.  TSH was noted to be elevated at 8.3 but free T4 was normal at 1.12.  Can be pursued in the outpatient setting.  Thrombocytosis Stable.  Abnormal LFTs Continue to monitor periodically.  New onset diabetes mellitus type 2 HbA1c 6.6.  Elevated CBGs most likely due to steroids.  Will need definitive management in the outpatient setting.  History of GERD PPI  Constipation Improved with bowel regimen.  Morbid obesity Estimated body mass index is 49.09 kg/m as calculated from the following:   Height as of this encounter: 5\' 5"  (1.651 m).   Weight as of this encounter: 133.8 kg.   Overall stable.  Patient wants to go home today.  Home oxygen to be arranged.  There has been a lot of delay with lab results today.  Her blood work from yesterday  was stable.  No need to repeat blood work today.  Okay for discharge.   PERTINENT LABS:  The results of significant diagnostics from this hospitalization  (including imaging, microbiology, ancillary and laboratory) are listed below for reference.      Labs:    Basic Metabolic Panel: Recent Labs  Lab 10/09/19 0103 10/09/19 0103 10/10/19 0998 10/11/19 1024 10/12/19 0515 10/13/19 0628 10/14/19 1331  NA 139   < > 138 138 135 136 137  K 4.4   < > 4.9 4.2 3.9 4.3 4.1  CL 104   < > 101 100 100 99 100  CO2 27   < > 26 25 22 25 24   GLUCOSE 183*   < > 254* 222* 166* 136* 219*  BUN 14   < > 15 15 16 17 18   CREATININE 0.95   < > 0.98 0.94 0.81 0.91 0.93  CALCIUM 9.2   < > 9.2 9.3 9.2 9.2 9.5  MG 2.6*  --  2.7* 2.6* 2.5* 2.5*  --   PHOS 4.1  --  4.4 2.9 3.7 2.9  --    < > = values in this interval not displayed.   Liver Function Tests: Recent Labs  Lab 10/09/19 0103 10/10/19 0339 10/11/19 1024 10/12/19 0515 10/13/19 0628  AST 18 21 18 18 18   ALT 23 25 23 23 19   ALKPHOS 36* 44 44 45 45  BILITOT 1.1 0.9 1.0 1.3* 1.8*  PROT 7.2 7.6 7.6 7.5 7.5  ALBUMIN 3.7 3.9 4.0 3.5 3.9   CBC: Recent Labs  Lab 10/09/19 0103 10/10/19 0339 10/11/19 1024 10/12/19 0515 10/13/19 0628  WBC 10.0 16.4* 18.9* 16.7* 17.5*  NEUTROABS 7.5 14.4* 17.2* 14.4* 14.7*  HGB 14.6 14.9 15.6* 15.7* 15.6*  HCT 43.9 45.8 47.0* 46.7* 46.1*  MCV 85.1 85.0 85.5 84.9 84.6  PLT 396 431* 515* 546* 485*   BNP: BNP (last 3 results) Recent Labs    10/07/19 0500 10/08/19 0852 10/09/19 0103  BNP 13.3 22.8 16.7    CBG: Recent Labs  Lab 10/14/19 1159 10/14/19 1611 10/14/19 2140 10/15/19 0733 10/15/19 1130  GLUCAP 211* 188* 185* 131* 187*     IMAGING STUDIES CT SOFT TISSUE NECK W CONTRAST  Result Date: 10/09/2019 CLINICAL DATA:  Neck pain, history of thyroid disease EXAM: CT NECK WITH CONTRAST TECHNIQUE: Multidetector CT imaging of the neck was performed using the standard protocol following the bolus administration of intravenous contrast. CONTRAST:  72mL OMNIPAQUE IOHEXOL 300 MG/ML  SOLN COMPARISON:  Correlation made with prior chest radiographs  FINDINGS: Motion artifact is present. Pharynx and larynx: Unremarkable.  No mass or swelling. Salivary glands: Unremarkable. Thyroid: Unremarkable. Lymph nodes: No enlarged nodes identified. Vascular: Major neck vessels are patent. Limited intracranial: No abnormal enhancement. Visualized orbits: Not included. Mastoids and visualized paranasal sinuses: Aerated. Skeleton: No significant abnormality. Upper chest: Partially imaged pneumomediastinum. Bilateral pulmonary ground-glass opacities reflecting known pneumonia. Other: No mediastinum extends into the soft tissues of the neck IMPRESSION: Pneumomediastinum extending into the neck soft tissues. In retrospect, this was probably present on 10/07/2019 chest radiograph. These results were called by telephone at the time of interpretation on 10/09/2019 at 12:42 pm to provider [facility] , who verbally acknowledged these results. Electronically Signed   By: Macy Mis M.D.   On: 10/09/2019 12:50   CT Angio Chest PE W and/or Wo Contrast  Result Date: 09/30/2019 CLINICAL DATA:  Dry cough, sore throat, fever, chills, COVID-19 positive EXAM: CT ANGIOGRAPHY CHEST WITH CONTRAST  TECHNIQUE: Multidetector CT imaging of the chest was performed using the standard protocol during bolus administration of intravenous contrast. Multiplanar CT image reconstructions and MIPs were obtained to evaluate the vascular anatomy. CONTRAST:  149mL OMNIPAQUE IOHEXOL 350 MG/ML SOLN COMPARISON:  09/30/2019 FINDINGS: Cardiovascular: This is a technically adequate evaluation of the pulmonary vasculature. There are no filling defects or pulmonary emboli. The heart is unremarkable with no significant pericardial effusion. Thoracic aorta is unremarkable. Mediastinum/Nodes: Thyroid, trachea, and esophagus are unremarkable. Small lymph nodes are seen in the bilateral hilar regions, likely reactive. No pathologic adenopathy. Lungs/Pleura: Nodular airspace disease is seen bilaterally throughout the  lungs, most consistent with multifocal pneumonia. No effusion or pneumothorax. Central airways are patent. Upper Abdomen: No acute abnormality. Musculoskeletal: No acute or destructive bony lesions. Reconstructed images demonstrate no additional findings. Review of the MIP images confirms the above findings. IMPRESSION: 1. No evidence of pulmonary embolus. 2. Multifocal bilateral nodular airspace disease, compatible with COVID-19 pneumonia. Electronically Signed   By: Randa Ngo M.D.   On: 09/30/2019 21:05   DG Chest Port 1 View  Result Date: 10/15/2019 CLINICAL DATA:  COVID pneumonia. EXAM: PORTABLE CHEST 1 VIEW COMPARISON:  10/13/2019. FINDINGS: Findings suggesting pneumomediastinum. Stable cardiomegaly. Diffuse progressive bilateral pulmonary infiltrates. No pleural effusion. Small right apical pneumothorax most likely present. IMPRESSION: 1. Findings suggesting pneumomediastinum noted on today's exam. Tiny right apical pneumothorax also appears to be present on today's exam. 2. Progressive diffuse bilateral pulmonary infiltrates consistent with progressive COVID pneumonia. Electronically Signed   By: Marcello Moores  Register   On: 10/15/2019 07:27   DG CHEST PORT 1 VIEW  Result Date: 10/13/2019 CLINICAL DATA:  Shortness of breath. EXAM: PORTABLE CHEST 1 VIEW COMPARISON:  October 12, 2019. FINDINGS: Stable cardiomediastinal silhouette. No pneumothorax or pleural effusion is noted. Hypoinflation of the lungs is noted with mild bibasilar subsegmental atelectasis or infiltrates. Bony thorax is unremarkable. IMPRESSION: Hypoinflation of the lungs with mild bibasilar subsegmental atelectasis or infiltrates. Electronically Signed   By: Marijo Conception M.D.   On: 10/13/2019 08:38   DG CHEST PORT 1 VIEW  Result Date: 10/12/2019 CLINICAL DATA:  COVID. EXAM: PORTABLE CHEST 1 VIEW COMPARISON:  10/11/2019 CT neck 10/09/2019. FINDINGS: Pneumomediastinum again noted. Heart size stable. Low lung volumes with bibasilar  pulmonary infiltrates again noted. No pleural effusion. Thorax IMPRESSION: 1.  Pneumomediastinum again noted. 2. Low lung volumes with bibasilar pulmonary infiltrates again noted. No interim change Electronically Signed   By: Marcello Moores  Register   On: 10/12/2019 07:11   DG CHEST PORT 1 VIEW  Result Date: 10/11/2019 CLINICAL DATA:  Dyspnea, COVID-19 positive. EXAM: PORTABLE CHEST 1 VIEW COMPARISON:  October 10, 2019. FINDINGS: The heart size and mediastinal contours are within normal limits. Minimal bibasilar subsegmental atelectasis or infiltrates are noted. Stable pneumomediastinum. The visualized skeletal structures are unremarkable. IMPRESSION: Stable pneumomediastinum. Minimal bibasilar subsegmental atelectasis or infiltrates are noted. Electronically Signed   By: Marijo Conception M.D.   On: 10/11/2019 08:17   DG CHEST PORT 1 VIEW  Result Date: 10/10/2019 CLINICAL DATA:  Shortness of breath.  COVID. EXAM: PORTABLE CHEST 1 VIEW COMPARISON:  10/09/2019 FINDINGS: Stable cardiomediastinal contours. Aortic atherosclerosis noted. Decreased lung volumes. No change in diffuse interstitial prominence compatible with viral pneumonia. No lobar consolidation, pleural effusion or pneumothorax. Previous pneumomediastinum is less conspicuous on today's study. IMPRESSION: 1. Persistent bilateral interstitial prominence compatible with viral pneumonia. 2. Pneumomediastinum is less conspicuous on today's study. Electronically Signed   By: Queen Slough.D.  On: 10/10/2019 08:49   DG CHEST PORT 1 VIEW  Result Date: 10/09/2019 CLINICAL DATA:  Shortness of breath.  COVID-19 positive. EXAM: PORTABLE CHEST 1 VIEW COMPARISON:  October 07, 2019 FINDINGS: Mild opacity in the lung bases. The heart, hila, mediastinum, lungs, and pleura are otherwise unremarkable. IMPRESSION: Mild opacities in the lung bases concerning for multifocal pneumonia. No other acute abnormalities. Electronically Signed   By: Dorise Bullion III M.D   On:  10/09/2019 14:58   DG CHEST PORT 1 VIEW  Result Date: 10/07/2019 CLINICAL DATA:  Follow-up pneumonia EXAM: PORTABLE CHEST 1 VIEW COMPARISON:  10/04/2019 FINDINGS: Mild cardiac enlargement, stable. No pleural effusion identified. Unchanged appearance of bilateral patchy opacities within both lungs concerning for multifocal pneumonia. The visualized osseous structures are unremarkable. IMPRESSION: Persistent bilateral patchy opacities concerning for multifocal pneumonia. Electronically Signed   By: Kerby Moors M.D.   On: 10/07/2019 07:49   DG Chest Port 1 View  Result Date: 10/04/2019 CLINICAL DATA:  Shortness of breath. EXAM: PORTABLE CHEST 1 VIEW COMPARISON:  Same day. FINDINGS: The heart size and mediastinal contours are within normal limits. No pneumothorax or pleural effusion is noted. Continued presence of multiple opacity seen throughout both lungs concerning for multifocal pneumonia. The visualized skeletal structures are unremarkable. IMPRESSION: Continued presence of multiple bilateral lung opacities concerning for multifocal pneumonia. Electronically Signed   By: Marijo Conception M.D.   On: 10/04/2019 11:24   DG Chest Portable 1 View  Result Date: 10/02/2019 CLINICAL DATA:  Worsening shortness of breath, COVID-19 positive EXAM: PORTABLE CHEST 1 VIEW COMPARISON:  09/30/2019 FINDINGS: Single frontal view of the chest demonstrates a stable cardiac silhouette. Lung volumes are diminished. The bilateral ground-glass airspace disease seen on prior chest x-ray is not significantly changed on this exam. No effusion or pneumothorax. No acute bony abnormality. IMPRESSION: 1. Stable multifocal pneumonia.  Pattern consistent with COVID-19. Electronically Signed   By: Randa Ngo M.D.   On: 10/02/2019 16:56   DG Chest Portable 1 View  Result Date: 09/30/2019 CLINICAL DATA:  COVID.  Headache. EXAM: PORTABLE CHEST 1 VIEW COMPARISON:  None. FINDINGS: Normal cardiac silhouette. Central venous pulmonary  congestion. No focal infiltrate. No pleural fluid. No pneumothorax. IMPRESSION: Central venous congestion.  No viral pneumonia identified. Electronically Signed   By: Suzy Bouchard M.D.   On: 09/30/2019 19:26    DISCHARGE EXAMINATION: Vitals:   10/14/19 1612 10/15/19 0019 10/15/19 0736 10/15/19 0755  BP: (!) 145/99 119/84 129/81   Pulse: (!) 108 93 99   Resp: 20 15 16    Temp: 97.7 F (36.5 C) 97.8 F (36.6 C) 98 F (36.7 C)   TempSrc:  Oral    SpO2: 92% 98% 96% 100%  Weight:      Height:       General appearance: Awake alert.  In no distress Resp: Normal effort at rest.  Mildly tachypneic with ambulation.  Few crackles at the bases bilaterally.  No wheezing or rhonchi. Cardio: S1-S2 is normal regular.  No S3-S4.  No rubs murmurs or bruit GI: Abdomen is soft.  Nontender nondistended.  Bowel sounds are present normal.  No masses organomegaly    DISPOSITION: Home with home oxygen  Discharge Instructions    Ambulatory referral to Pulmonology   Complete by: As directed    In 1-2 weeks. Covid. Home o2. Pneumomediastinum.   Reason for referral: Other   Call MD for:  difficulty breathing, headache or visual disturbances   Complete by: As directed  Call MD for:  extreme fatigue   Complete by: As directed    Call MD for:  persistant dizziness or light-headedness   Complete by: As directed    Call MD for:  persistant nausea and vomiting   Complete by: As directed    Call MD for:  severe uncontrolled pain   Complete by: As directed    Call MD for:  temperature >100.4   Complete by: As directed    Diet - low sodium heart healthy   Complete by: As directed    Discharge instructions   Complete by: As directed    Please follow-up with a primary care provider for further management of the newly diagnosed diabetes.  COVID 19 INSTRUCTIONS  - You are felt to be stable enough to no longer require inpatient monitoring, testing, and treatment, though you will need to follow the  recommendations below: - Based on the CDC's non-test criteria for ending self-isolation: You may not return to work/leave the home until at least 20 days since symptom onset AND 24 hours without a fever (without taking tylenol, ibuprofen, etc.) AND have improvement in respiratory symptoms. - Follow up with your doctor in the next week via telehealth or seek medical attention right away if your symptoms get WORSE.    Directions for you at home:  Wear a facemask You should wear a facemask that covers your nose and mouth when you are in the same room with other people and when you visit a healthcare provider. People who live with or visit you should also wear a facemask while they are in the same room with you.  Separate yourself from other people in your home As much as possible, you should stay in a different room from other people in your home. Also, you should use a separate bathroom, if available.  Avoid sharing household items You should not share dishes, drinking glasses, cups, eating utensils, towels, bedding, or other items with other people in your home. After using these items, you should wash them thoroughly with soap and water.  Cover your coughs and sneezes Cover your mouth and nose with a tissue when you cough or sneeze, or you can cough or sneeze into your sleeve. Throw used tissues in a lined trash can, and immediately wash your hands with soap and water for at least 20 seconds or use an alcohol-based hand rub.  Wash your [facility] your hands often and thoroughly with soap and water for at least 20 seconds. You can use an alcohol-based hand sanitizer if soap and water are not available and if your hands are not visibly dirty. Avoid touching your eyes, nose, and mouth with unwashed hands.  Directions for those who live with, or provide care at home for you:  Limit the number of people who have contact with the patient If possible, have only one caregiver for the  patient. Other household members should stay in another home or place of residence. If this is not possible, they should stay in another room, or be separated from the patient as much as possible. Use a separate bathroom, if available. Restrict visitors who do not have an essential need to be in the home.  Ensure good ventilation Make sure that shared spaces in the home have good air flow, such as from an air conditioner or an opened window, weather permitting.  Wash your hands often Wash your hands often and thoroughly with soap and water for at least 20 seconds. You can  use an alcohol based hand sanitizer if soap and water are not available and if your hands are not visibly dirty. Avoid touching your eyes, nose, and mouth with unwashed hands. Use disposable paper towels to dry your hands. If not available, use dedicated cloth towels and replace them when they become wet.  Wear a facemask and gloves Wear a disposable facemask at all times in the room and gloves when you touch or have contact with the patient's blood, body fluids, and/or secretions or excretions, such as sweat, saliva, sputum, nasal mucus, vomit, urine, or feces.  Ensure the mask fits over your nose and mouth tightly, and do not touch it during use. Throw out disposable facemasks and gloves after using them. Do not reuse. Wash your hands immediately after removing your facemask and gloves. If your personal clothing becomes contaminated, carefully remove clothing and launder. Wash your hands after handling contaminated clothing. Place all used disposable facemasks, gloves, and other waste in a lined container before disposing them with other household waste. Remove gloves and wash your hands immediately after handling these items.  Do not share dishes, glasses, or other household items with the patient Avoid sharing household items. You should not share dishes, drinking glasses, cups, eating utensils, towels, bedding, or other  items with a patient who is confirmed to have, or being evaluated for, COVID-19 infection. After the person uses these items, you should wash them thoroughly with soap and water.  Wash laundry thoroughly Immediately remove and wash clothes or bedding that have blood, body fluids, and/or secretions or excretions, such as sweat, saliva, sputum, nasal mucus, vomit, urine, or feces, on them. Wear gloves when handling laundry from the patient. Read and follow directions on labels of laundry or clothing items and detergent. In general, wash and dry with the warmest temperatures recommended on the label.  Clean all areas the individual has used often Clean all touchable surfaces, such as counters, tabletops, doorknobs, bathroom fixtures, toilets, phones, keyboards, tablets, and bedside tables, every day. Also, clean any surfaces that may have blood, body fluids, and/or secretions or excretions on them. Wear gloves when cleaning surfaces the patient has come in contact with. Use a diluted bleach solution (e.g., dilute bleach with 1 part bleach and 10 parts water) or a household disinfectant with a label that says EPA-registered for coronaviruses. To make a bleach solution at home, add 1 tablespoon of bleach to 1 quart (4 cups) of water. For a larger supply, add  cup of bleach to 1 gallon (16 cups) of water. Read labels of cleaning products and follow recommendations provided on product labels. Labels contain instructions for safe and effective use of the cleaning product including precautions you should take when applying the product, such as wearing gloves or eye protection and making sure you have good ventilation during use of the product. Remove gloves and wash hands immediately after cleaning.  Monitor yourself for signs and symptoms of illness Caregivers and household members are considered close contacts, should monitor their health, and will be asked to limit movement outside of the home to the  extent possible. Follow the monitoring steps for close contacts listed on the symptom monitoring form.   If you have additional questions, contact your local health department or call the epidemiologist on call at [phone] (available 24/7). This guidance is subject to change. For the most up-to-date guidance from [facility], please refer to their website: YouBlogs.pl   You were cared for by a hospitalist during your hospital stay.  If you have any questions about your discharge medications or the care you received while you were in the hospital after you are discharged, you can call the unit and asked to speak with the hospitalist on call if the hospitalist that took care of you is not available. Once you are discharged, your primary care physician will handle any further medical issues. Please note that NO REFILLS for any discharge medications will be authorized once you are discharged, as it is imperative that you return to your primary care physician (or establish a relationship with a primary care physician if you do not have one) for your aftercare needs so that they can reassess your need for medications and monitor your lab values. If you do not have a primary care physician, you can call [phone] for a physician referral.   Increase activity slowly   Complete by: As directed         Allergies as of 10/15/2019   No Known Allergies     Medication List    STOP taking these medications   dexamethasone 6 MG tablet Commonly known as: DECADRON     TAKE these medications   acetaminophen 500 MG tablet Commonly known as: TYLENOL Take 500-1,000 mg by mouth every 6 (six) hours as needed for mild pain, fever or headache.   albuterol 108 (90 Base) MCG/ACT inhaler Commonly known as: VENTOLIN HFA Inhale 1-2 puffs into the lungs every 6 (six) hours as needed for wheezing or shortness of breath.   benzonatate 100 MG capsule Commonly  known as: TESSALON Take 1 capsule (100 mg total) by mouth every 8 (eight) hours.   chlorpheniramine-HYDROcodone 10-8 MG/5ML Suer Commonly known as: TUSSIONEX Take 5 mLs by mouth every 12 (twelve) hours as needed for cough.   fluticasone 50 MCG/ACT nasal spray Commonly known as: FLONASE Place 2 sprays into both nostrils daily.   furosemide 20 MG tablet Commonly known as: Lasix Take 1 tablet (20 mg total) by mouth daily for 7 days.   ibuprofen 200 MG tablet Commonly known as: ADVIL Take 200-400 mg by mouth every 6 (six) hours as needed for fever, headache or mild pain.   levothyroxine 200 MCG tablet Commonly known as: SYNTHROID Take 200 mcg by mouth daily before breakfast.   ondansetron 4 MG disintegrating tablet Commonly known as: Zofran ODT Take 1 tablet (4 mg total) by mouth every 8 (eight) hours as needed for nausea or vomiting. What changed: reasons to take this   oxyCODONE-acetaminophen 5-325 MG tablet Commonly known as: PERCOCET/ROXICET Take 1 tablet by mouth every 6 (six) hours as needed for severe pain.   pantoprazole 40 MG tablet Commonly known as: PROTONIX Take 1 tablet (40 mg total) by mouth 2 (two) times daily for 14 days.   polyethylene glycol 17 g packet Commonly known as: MIRALAX / GLYCOLAX Take 17 g by mouth daily as needed.   predniSONE 20 MG tablet Commonly known as: DELTASONE Take 3 tablets once daily for 3 days followed by 2 tablets once daily for 3 days followed by 1 tablet once daily for 3 days and then stop   ZyrTEC Allergy 10 MG tablet Generic drug: cetirizine Take 10 mg by mouth daily as needed for allergies or rhinitis.            Durable Medical Equipment  (From admission, onward)         Start     Ordered   10/15/19 1224  For home use only DME oxygen  Once  Question Answer Comment  Length of Need 6 Months   Mode or (Route) Nasal cannula   Liters per Minute 3   Frequency Continuous (stationary and portable oxygen unit  needed)   Oxygen conserving device Yes   Oxygen delivery system Gas      10/15/19 1223            Follow-up Information    Loraine Leriche., MD. Schedule an appointment as soon as possible for a visit in 1 week(s).   Specialty: Internal Medicine Contact information: [place] [address] [phone]               TOTAL DISCHARGE TIME: 25 minutes  [place]  [facility] Pager on [email or web address]  10/15/2019, 1:43 PM

## 2019-10-15 NOTE — Progress Notes (Signed)
  RD consulted for nutrition education regarding diabetes.   Lab Results  Component Value Date   HGBA1C 6.6 (H) 10/08/2019    Discussed different food groups and their effects on blood sugar, emphasizing carbohydrate-containing foods. Discussed importance of controlled and consistent carbohydrate intake throughout the day. Provided examples of ways to balance meals/snacks and encouraged intake of high-fiber, whole grain complex carbohydrates. Teach back method used.  Pt denies taste changes or loss in appetite associated with COVID. Typically eats three meals daily that consist of good protein sources. Discussed current A1C and how steroids play a role in current reading. Encouraged balanced meals with protein, vegetable, and carbohydrate. Discussed how to make appropriate beverage substitutions for sugar sweetened beverages. Handout left in d/c instructions.   Expect good compliance.  Labs and medications reviewed. No further nutrition interventions warranted at this time. RD contact information provided. If additional nutrition issues arise, please re-consult RD.  Mariana Single RD, LDN Clinical Nutrition Pager listed in [place]

## 2019-10-15 NOTE — Progress Notes (Signed)
Patient on room air, dangled patient at edge of bed. O2 sats dropped to 86%. Applied 2 and then 3 L to increase saturations to >92%.  Patient then ambulating 15 feet slowly in hall, having to stop 2x due to SOB and desaturations to 87%. Pt desaturated a third time to 85% after walking 10 feet back towards room, HR increasing to 120. O2 was increased to 4L with improvement of RR and HR. Pt able to ambulate 10 more feet back to room. Had patient dangle on edge of bed at 3L, O2 improving from 88% to 92% within less than a minute.

## 2019-10-16 ENCOUNTER — Inpatient Hospital Stay (HOSPITAL_BASED_OUTPATIENT_CLINIC_OR_DEPARTMENT_OTHER)
Admission: EM | Admit: 2019-10-16 | Discharge: 2019-10-20 | DRG: 177 | Disposition: A | Discharge status: Home / Self Care | Payer: BC Managed Care – PPO | Attending: Family Medicine | Admitting: Family Medicine

## 2019-10-16 ENCOUNTER — Inpatient Hospital Stay (HOSPITAL_COMMUNITY): Payer: BC Managed Care – PPO

## 2019-10-16 ENCOUNTER — Encounter (HOSPITAL_BASED_OUTPATIENT_CLINIC_OR_DEPARTMENT_OTHER): Payer: Self-pay | Admitting: Emergency Medicine

## 2019-10-16 ENCOUNTER — Emergency Department (HOSPITAL_BASED_OUTPATIENT_CLINIC_OR_DEPARTMENT_OTHER): Payer: BC Managed Care – PPO

## 2019-10-16 ENCOUNTER — Other Ambulatory Visit: Payer: Self-pay

## 2019-10-16 DIAGNOSIS — Z9689 Presence of other specified functional implants: Secondary | ICD-10-CM

## 2019-10-16 DIAGNOSIS — U071 COVID-19: Secondary | ICD-10-CM | POA: Diagnosis present

## 2019-10-16 DIAGNOSIS — Z938 Other artificial opening status: Secondary | ICD-10-CM

## 2019-10-16 DIAGNOSIS — Z8249 Family history of ischemic heart disease and other diseases of the circulatory system: Secondary | ICD-10-CM | POA: Diagnosis not present

## 2019-10-16 DIAGNOSIS — E039 Hypothyroidism, unspecified: Secondary | ICD-10-CM | POA: Diagnosis present

## 2019-10-16 DIAGNOSIS — D72829 Elevated white blood cell count, unspecified: Secondary | ICD-10-CM | POA: Diagnosis present

## 2019-10-16 DIAGNOSIS — J939 Pneumothorax, unspecified: Secondary | ICD-10-CM

## 2019-10-16 DIAGNOSIS — Z79890 Hormone replacement therapy (postmenopausal): Secondary | ICD-10-CM | POA: Diagnosis not present

## 2019-10-16 DIAGNOSIS — R7989 Other specified abnormal findings of blood chemistry: Secondary | ICD-10-CM | POA: Diagnosis present

## 2019-10-16 DIAGNOSIS — E878 Other disorders of electrolyte and fluid balance, not elsewhere classified: Secondary | ICD-10-CM | POA: Diagnosis present

## 2019-10-16 DIAGNOSIS — E119 Type 2 diabetes mellitus without complications: Secondary | ICD-10-CM | POA: Diagnosis present

## 2019-10-16 DIAGNOSIS — Z6841 Body Mass Index (BMI) 40.0 and over, adult: Secondary | ICD-10-CM

## 2019-10-16 DIAGNOSIS — J9601 Acute respiratory failure with hypoxia: Secondary | ICD-10-CM | POA: Diagnosis present

## 2019-10-16 DIAGNOSIS — D473 Essential (hemorrhagic) thrombocythemia: Secondary | ICD-10-CM | POA: Diagnosis not present

## 2019-10-16 DIAGNOSIS — J982 Interstitial emphysema: Secondary | ICD-10-CM

## 2019-10-16 DIAGNOSIS — R0600 Dyspnea, unspecified: Secondary | ICD-10-CM

## 2019-10-16 DIAGNOSIS — J9383 Other pneumothorax: Secondary | ICD-10-CM | POA: Diagnosis present

## 2019-10-16 DIAGNOSIS — D75839 Thrombocytosis, unspecified: Secondary | ICD-10-CM | POA: Diagnosis present

## 2019-10-16 DIAGNOSIS — M7989 Other specified soft tissue disorders: Secondary | ICD-10-CM | POA: Diagnosis not present

## 2019-10-16 DIAGNOSIS — E871 Hypo-osmolality and hyponatremia: Secondary | ICD-10-CM | POA: Diagnosis present

## 2019-10-16 DIAGNOSIS — K5900 Constipation, unspecified: Secondary | ICD-10-CM | POA: Diagnosis present

## 2019-10-16 DIAGNOSIS — J9312 Secondary spontaneous pneumothorax: Secondary | ICD-10-CM | POA: Diagnosis not present

## 2019-10-16 DIAGNOSIS — J438 Other emphysema: Secondary | ICD-10-CM | POA: Diagnosis present

## 2019-10-16 DIAGNOSIS — E876 Hypokalemia: Secondary | ICD-10-CM | POA: Diagnosis present

## 2019-10-16 DIAGNOSIS — J1282 Pneumonia due to coronavirus disease 2019: Secondary | ICD-10-CM | POA: Diagnosis present

## 2019-10-16 LAB — BASIC METABOLIC PANEL
Anion gap: 12 (ref 5–15)
BUN: 16 mg/dL (ref 6–20)
CO2: 25 mmol/L (ref 22–32)
Calcium: 8.8 mg/dL — ABNORMAL LOW (ref 8.9–10.3)
Chloride: 96 mmol/L — ABNORMAL LOW (ref 98–111)
Creatinine, Ser: 0.74 mg/dL (ref 0.44–1.00)
GFR calc Af Amer: >60 mL/min (ref >60)
GFR calc non Af Amer: >60 mL/min (ref >60)
Glucose, Bld: 130 mg/dL — ABNORMAL HIGH (ref 70–99)
Potassium: 3 mmol/L — ABNORMAL LOW (ref 3.5–5.1)
Sodium: 133 mmol/L — ABNORMAL LOW (ref 135–145)

## 2019-10-16 LAB — CBC WITH DIFFERENTIAL/PLATELET
Abs Immature Granulocytes: 0.23 10*3/uL — ABNORMAL HIGH (ref 0.00–0.07)
Basophils Absolute: 0 10*3/uL (ref 0.0–0.1)
Basophils Relative: 0 %
Eosinophils Absolute: 0.1 10*3/uL (ref 0.0–0.5)
Eosinophils Relative: 1 %
HCT: 43.1 % (ref 36.0–46.0)
Hemoglobin: 14.8 g/dL (ref 12.0–15.0)
Immature Granulocytes: 1 %
Lymphocytes Relative: 11 %
Lymphs Abs: 1.9 10*3/uL (ref 0.7–4.0)
MCH: 28.4 pg (ref 26.0–34.0)
MCHC: 34.3 g/dL (ref 30.0–36.0)
MCV: 82.6 fL (ref 80.0–100.0)
Monocytes Absolute: 1.5 10*3/uL — ABNORMAL HIGH (ref 0.1–1.0)
Monocytes Relative: 9 %
Neutro Abs: 13.3 10*3/uL — ABNORMAL HIGH (ref 1.7–7.7)
Neutrophils Relative %: 78 %
Platelets: 453 10*3/uL — ABNORMAL HIGH (ref 150–400)
RBC: 5.22 MIL/uL — ABNORMAL HIGH (ref 3.87–5.11)
RDW: 13.8 % (ref 11.5–15.5)
WBC: 17 10*3/uL — ABNORMAL HIGH (ref 4.0–10.5)
nRBC: 0 % (ref 0.0–0.2)

## 2019-10-16 LAB — C-REACTIVE PROTEIN: CRP: 0.7 mg/dL (ref <1.0)

## 2019-10-16 LAB — CBC
HCT: 46.1 % — ABNORMAL HIGH (ref 36.0–46.0)
Hemoglobin: 15.6 g/dL — ABNORMAL HIGH (ref 12.0–15.0)
MCH: 28.8 pg (ref 26.0–34.0)
MCHC: 33.8 g/dL (ref 30.0–36.0)
MCV: 85.1 fL (ref 80.0–100.0)
Platelets: 421 10*3/uL — ABNORMAL HIGH (ref 150–400)
RBC: 5.42 MIL/uL — ABNORMAL HIGH (ref 3.87–5.11)
RDW: 14.4 % (ref 11.5–15.5)
WBC: 14.5 10*3/uL — ABNORMAL HIGH (ref 4.0–10.5)
nRBC: 0.5 % — ABNORMAL HIGH (ref 0.0–0.2)

## 2019-10-16 MED ORDER — ONDANSETRON HCL 4 MG/2ML IJ SOLN: 4.0000 mg | Freq: Four times a day (QID) | INTRAMUSCULAR | Status: DC | PRN

## 2019-10-16 MED ORDER — MIDAZOLAM HCL 2 MG/2ML IJ SOLN
INTRAMUSCULAR | Status: AC
  Filled 2019-10-16: qty 2

## 2019-10-16 MED ORDER — FENTANYL CITRATE (PF) 100 MCG/2ML IJ SOLN
50.0000 ug | Freq: Once | INTRAMUSCULAR | Status: AC
  Administered 2019-10-16: 50 ug via INTRAVENOUS
  Filled 2019-10-16: qty 2

## 2019-10-16 MED ORDER — LIDOCAINE HCL 1 % IJ SOLN: 15.0000 mL | Freq: Once | INTRAMUSCULAR | Status: DC

## 2019-10-16 MED ORDER — MIDAZOLAM HCL 2 MG/2ML IJ SOLN
1.0000 mg | Freq: Once | INTRAMUSCULAR | Status: AC
  Administered 2019-10-16: 1 mg via INTRAVENOUS
  Filled 2019-10-16: qty 2

## 2019-10-16 MED ORDER — PREDNISONE 20 MG PO TABS
60.0000 mg | ORAL_TABLET | Freq: Every day | ORAL | Status: DC
  Administered 2019-10-16 – 2019-10-20 (×5): 60 mg via ORAL
  Filled 2019-10-16 (×5): qty 3

## 2019-10-16 MED ORDER — ACETAMINOPHEN 650 MG RE SUPP: 650.0000 mg | Freq: Four times a day (QID) | RECTAL | Status: DC | PRN

## 2019-10-16 MED ORDER — LIDOCAINE HCL (PF) 1 % IJ SOLN
15.0000 mL | Freq: Once | INTRAMUSCULAR | Status: DC
  Filled 2019-10-16: qty 15

## 2019-10-16 MED ORDER — ENOXAPARIN SODIUM 60 MG/0.6ML ~~LOC~~ SOLN
60.0000 mg | SUBCUTANEOUS | Status: DC
  Administered 2019-10-16 – 2019-10-19 (×4): 60 mg via SUBCUTANEOUS
  Filled 2019-10-16 (×5): qty 0.6

## 2019-10-16 MED ORDER — SODIUM CHLORIDE 0.9% FLUSH
10.0000 mL | Freq: Three times a day (TID) | INTRAVENOUS | Status: DC
  Administered 2019-10-17 – 2019-10-20 (×7): 10 mL

## 2019-10-16 MED ORDER — ONDANSETRON HCL 4 MG PO TABS: 4.0000 mg | ORAL_TABLET | Freq: Four times a day (QID) | ORAL | Status: DC | PRN

## 2019-10-16 MED ORDER — ACETAMINOPHEN 325 MG PO TABS
650.0000 mg | ORAL_TABLET | Freq: Four times a day (QID) | ORAL | Status: DC | PRN
  Administered 2019-10-18 – 2019-10-19 (×3): 650 mg via ORAL
  Filled 2019-10-16 (×3): qty 2

## 2019-10-16 NOTE — Consult Note (Signed)
NAMEMilena Calhoun, MRN:  [id number], DOB:  01-25-1996, LOS: 0 ADMISSION DATE:  10/16/2019, CONSULTATION DATE:  9/4 REFERRING MD:  Doristine Bosworth - TRH  CHIEF COMPLAINT:  Air in face and neck   Brief History   24 yo F recently discharged 9/3 after covid admission, who presented to [facility] 9/4 with "air in face and neck" CT reveals worse pneumomediastinum and L ptx ED to Ed transfer PCCM consulted, will place chest tube   History of present illness     24 yo F PMH COVID-19 PNA, morbid obesity, hypothyroidism presents to ED 8/4 with CC "Air in face and neck." Pt was recently admitted 8/23 - 9/3 for acute hypoxic respiratory failure related to COVID-19 where she was found to have a pneumomediastinum which did not require intervention. Patient continued to clinically improve and was discharged home on [facility] 9/3. Pt suddenly developed SOB and subcutaneous air in face and neck, and presented to [facility] for prompt evaluation. Pt underwent ED to ED transfer, and arrived at [facility] 9/4. CT chest revealed L ptx and worsening pneumomediastinum.    PCCM consulted to evaluate PTX    Past Medical History  COVID-19 PNA Obesity Hypothyroidism Asthma  Significant Hospital Events   9/4 ED to ED transfer from [facility] for pneumomediastinum. Admitted to [facility]. After admit, while in ED, PCCm consulted for pneumomediastinum. PCCM noted L ptx on CT chest, placed L pigtail.   Consults:  PCCM   Procedures:  9/4 L pigtail >   Significant Diagnostic Tests:  9/4 CT chest> L PTX, large pneumomediastinum. Emphysema extending throughout neck. Multifocal PNA   Micro Data:    Antimicrobials:    Interim history/subjective:  Arrived in ED from [place] , HDS and with SpO2 100%   Objective   Blood pressure 115/65, pulse (!) 116, temperature 98.3 F (36.8 C), temperature source Oral, resp. rate (!) 26, height 5\' 5"  (1.651 m), weight 133.8 kg, SpO2 100 %.       No intake or output data in the 24 hours ending 10/16/19  1738 Filed Weights   10/16/19 0321  Weight: 133.8 kg    Examination: General: Obese young adult F, reclined in bed NAD  HENT: Moodus. Facial swelling. No crepitus Trachea midline Lungs: Coarse R sided breath sounds. Diminished L sided sounds. Cardiovascular: Sinus tachycardia. s1s2 no rgm cap refill < 3seconds Abdomen: obese soft round ndnt  Extremities: Symmetrical bulk and tone no obvious joint deformity  Neuro: AAOx4 following commands PERRLA GU: WNL  Resolved Hospital Problem list     Assessment & Plan:   Acute hypoxic respiratory failure in setting of COVID 19 PNA L sided pneumothorax Pneumomediastinum -dc on [facility] 9/3. Presents back to ED with "air in face and neck" found to have new PTX P -L pigtail placed by PCCM -continue to suction -20cm -f/u CXR post procedure  -AM CXR  -Continue HFNC / NRB for washout-- continue if not hypoxic    PCCM will follow for chest tube  Best practice:  Diet: PO  Pain/Anxiety/Delirium protocol (if indicated): APAP  VAP protocol (if indicated): na DVT prophylaxis: lovenox  GI prophylaxis: n/a Glucose control: monitor Mobility: PT OT  Code Status: Full  Family Communication: pt updated Disposition: admit to SDU   Labs   CBC: Recent Labs  Lab 10/10/19 0339 10/11/19 1024 10/12/19 0515 10/13/19 0628 10/16/19 0517  WBC 16.4* 18.9* 16.7* 17.5* 17.0*  NEUTROABS 14.4* 17.2* 14.4* 14.7* 13.3*  HGB 14.9 15.6* 15.7* 15.6* 14.8  HCT 45.8 47.0* 46.7* 46.1* 43.1  MCV 85.0 85.5 84.9 84.6 82.6  PLT 431* 515* 546* 485* 453*    Basic Metabolic Panel: Recent Labs  Lab 10/10/19 0339 10/10/19 0339 10/11/19 1024 10/12/19 0515 10/13/19 0628 10/14/19 1331 10/16/19 0517  NA 138   < > 138 135 136 137 133*  K 4.9   < > 4.2 3.9 4.3 4.1 3.0*  CL 101   < > 100 100 99 100 96*  CO2 26   < > 25 22 25 24 25   GLUCOSE 254*   < > 222* 166* 136* 219* 130*  BUN 15   < > 15 16 17 18 16   CREATININE 0.98   < > 0.94 0.81 0.91 0.93 0.74  CALCIUM 9.2    < > 9.3 9.2 9.2 9.5 8.8*  MG 2.7*  --  2.6* 2.5* 2.5*  --   --   PHOS 4.4  --  2.9 3.7 2.9  --   --    < > = values in this interval not displayed.   GFR: Estimated Creatinine Clearance: 151.4 mL/min (by C-G formula based on SCr of 0.74 mg/dL). Recent Labs  Lab 10/11/19 1024 10/12/19 0515 10/13/19 0628 10/16/19 0517  WBC 18.9* 16.7* 17.5* 17.0*    Liver Function Tests: Recent Labs  Lab 10/10/19 0339 10/11/19 1024 10/12/19 0515 10/13/19 0628  AST 21 18 18 18   ALT 25 23 23 19   ALKPHOS 44 44 45 45  BILITOT 0.9 1.0 1.3* 1.8*  PROT 7.6 7.6 7.5 7.5  ALBUMIN 3.9 4.0 3.5 3.9   No results for input(s): LIPASE, AMYLASE in the last 168 hours. No results for input(s): AMMONIA in the last 168 hours.  ABG No results found for: PHART, PCO2ART, PO2ART, HCO3, TCO2, ACIDBASEDEF, O2SAT   Coagulation Profile: No results for input(s): INR, PROTIME in the last 168 hours.  Cardiac Enzymes: No results for input(s): CKTOTAL, CKMB, CKMBINDEX, TROPONINI in the last 168 hours.  HbA1C: Hgb A1c MFr Bld  Date/Time Value Ref Range Status  10/08/2019 08:52 AM 6.6 (H) 4.8 - 5.6 % Final    Comment:    (NOTE) Pre diabetes:          5.7%-6.4%  Diabetes:              >6.4%  Glycemic control for   <7.0% adults with diabetes     CBG: Recent Labs  Lab 10/14/19 1159 10/14/19 1611 10/14/19 2140 10/15/19 0733 10/15/19 1130  GLUCAP 211* 188* 185* 131* 187*    Review of Systems:   + SOB, cough + swelling face, chest + Fatigue, weakness + chest tightness   Past Medical History  She,  has a past medical history of Asthma and Hypothyroidism.   Surgical History   History reviewed. No pertinent surgical history.   Social History   reports that she has never smoked. She has never used smokeless tobacco. She reports current alcohol use. She reports that she does not use drugs.   Family History   Her family history includes Hypertension in her father and mother.   Allergies No  Known Allergies   Home Medications  Prior to Admission medications   Medication Sig Start Date End Date Taking? Authorizing Provider  acetaminophen (TYLENOL) 500 MG tablet Take 500-1,000 mg by mouth every 6 (six) hours as needed for mild pain, fever or headache.    [provider]  albuterol (VENTOLIN HFA) 108 (90 Base) MCG/ACT inhaler Inhale 1-2 puffs into the  lungs every 6 (six) hours as needed for wheezing or shortness of breath. 09/30/19   Henderly, Britni A, PA-C  benzonatate (TESSALON) 100 MG capsule Take 1 capsule (100 mg total) by mouth every 8 (eight) hours. 09/30/19   Henderly, Britni A, PA-C  cetirizine (ZYRTEC ALLERGY) 10 MG tablet Take 10 mg by mouth daily as needed for allergies or rhinitis.    [provider]  chlorpheniramine-HYDROcodone (TUSSIONEX) 10-8 MG/5ML SUER Take 5 mLs by mouth every 12 (twelve) hours as needed for cough. 10/15/19   Bonnielee Haff, MD  fluticasone [facility]) 50 MCG/ACT nasal spray Place 2 sprays into both nostrils daily. 09/30/19   Henderly, Britni A, PA-C  furosemide (LASIX) 20 MG tablet Take 1 tablet (20 mg total) by mouth daily for 7 days. 10/15/19 10/22/19  Bonnielee Haff, MD  ibuprofen (ADVIL) 200 MG tablet Take 200-400 mg by mouth every 6 (six) hours as needed for fever, headache or mild pain.    [provider]  levothyroxine (SYNTHROID) 200 MCG tablet Take 200 mcg by mouth daily before breakfast.    [provider]  ondansetron (ZOFRAN ODT) 4 MG disintegrating tablet Take 1 tablet (4 mg total) by mouth every 8 (eight) hours as needed for nausea or vomiting. Patient taking differently: Take 4 mg by mouth every 8 (eight) hours as needed for nausea or vomiting (DISSOLVE ORALLY).  09/30/19   Henderly, Britni A, PA-C  oxyCODONE-acetaminophen (PERCOCET/ROXICET) 5-325 MG tablet Take 1 tablet by mouth every 6 (six) hours as needed for severe pain. 10/02/19   Breck Coons, MD  pantoprazole (PROTONIX) 40 MG tablet Take 1 tablet (40 mg  total) by mouth 2 (two) times daily for 14 days. 10/15/19 10/29/19  Bonnielee Haff, MD  polyethylene glycol (MIRALAX / GLYCOLAX) 17 g packet Take 17 g by mouth daily as needed. 10/15/19   Bonnielee Haff, MD  predniSONE (DELTASONE) 20 MG tablet Take 3 tablets once daily for 3 days followed by 2 tablets once daily for 3 days followed by 1 tablet once daily for 3 days and then stop 10/15/19   Bonnielee Haff, MD     Eliseo Gum MSN, AGACNP-BC [place] [phone] If no answer, [phone] 10/16/2019, 5:38 PM

## 2019-10-16 NOTE — Procedures (Signed)
Insertion of Chest Tube Procedure Note  Sarah Calhoun  [id number]  1995/09/03  Date:10/16/19  Time:5:40 PM    Provider Performing: Jacky Kindle   Procedure: Pleural Catheter Insertion w/ Imaging Guidance (07225)  Indication(s) Pneumothorax  Consent Risks of the procedure as well as the alternatives and risks of each were explained to the patient and/or caregiver.  Consent for the procedure was obtained and is signed in the bedside chart  Anesthesia Topical only with 1% lidocaine    Time Out Verified patient identification, verified procedure, site/side was marked, verified correct patient position, special equipment/implants available, medications/allergies/relevant history reviewed, required imaging and test results available.   Sterile Technique Maximal sterile technique including full sterile barrier drape, hand hygiene, sterile gown, sterile gloves, mask, hair covering, sterile ultrasound probe cover (if used).   Procedure Description Ultrasound used to identify appropriate pleural anatomy for placement and overlying skin marked. Area of placement cleaned and draped in sterile fashion.  A 14 French pigtail pleural catheter was placed into the left pleural space using Seldinger technique. Appropriate return of air was obtained.  The tube was connected to atrium and placed on -20 cm H2O wall suction.   Complications/Tolerance None; patient tolerated the procedure well. Chest X-ray is ordered to verify placement.   EBL Minimal  Specimen(s) none

## 2019-10-16 NOTE — ED Provider Notes (Signed)
Emergency department observation rounding note.  24 year old female admitted to hospitalist service for observation.  She presented today for pain around her neck and face. She was recently diagnosed with pneumomediastinum and pneumothorax as well as Covid 19 viral infection. Physical Exam  BP (!) 115/98 (BP Location: Left Arm)   Pulse (!) 110   Temp 98.3 F (36.8 C) (Oral)   Resp (!) 27   Ht 5\' 5"  (1.651 m)   Wt 133.8 kg   SpO2 98%   BMI 49.09 kg/m   Physical Exam Constitutional:      General: She is not in acute distress.    Appearance: Normal appearance. She is well-developed. She is obese. She is not ill-appearing or diaphoretic.  HENT:     Head: Normocephalic and atraumatic.  Eyes:     General: Vision grossly intact. Gaze aligned appropriately.     Pupils: Pupils are equal, round, and reactive to light.  Neck:     Trachea: Trachea and phonation normal.  Pulmonary:     Effort: Pulmonary effort is normal. No respiratory distress.  Musculoskeletal:        General: Normal range of motion.     Cervical back: Normal range of motion.  Skin:    General: Skin is warm and dry.  Neurological:     Mental Status: She is alert.     GCS: GCS eye subscore is 4. GCS verbal subscore is 5. GCS motor subscore is 6.     Comments: Speech is clear and goal oriented, follows commands Major Cranial nerves without deficit, no facial droop Moves extremities without ataxia, coordination intact  Psychiatric:        Behavior: Behavior normal.     ED Course/Procedures     Procedures  MDM  CBC shows leukocytosis of 17.0, no anemia.  BMP showed mild hyponatremia, hypochloremia and hypokalemia, no AKI or gap.  CT scan of the chest and neck showed worsening pneumomediastinum extending into the neck, large left pneumothorax and multifocal pneumonia.  Patient is on nonrebreather.  10:45 AM: Patient reassessed, she is sitting upright in bed no acute distress reports she is feeling tired but no  other complaints at this time.  Vital signs reviewed, afebrile, mild tachycardia 101 bpm, no hypotension or hypoxia.  Patient is currently awaiting bed.  Note: Portions of this report may have been transcribed using voice recognition software. Every effort was made to ensure accuracy; however, inadvertent computerized transcription errors may still be present.    Gari Crown 10/16/19 1121    Gareth Morgan, MD 10/18/19 1035

## 2019-10-16 NOTE — ED Triage Notes (Signed)
Pt was discharged from [facility] yesterday after being in the hospital for 2 weeks with covid and pneumonia  Pt states she was doing well but tonight she feels like she has free air in her neck and up into her face  Pt states [facility] and she was told to come to the hospital  Pt states her face and neck are painful on that right side  Pt is on oxygen at home at 3 liters

## 2019-10-16 NOTE — ED Notes (Signed)
Patient sent to [facility] via [facility], Report given to [place], [place]

## 2019-10-16 NOTE — Progress Notes (Signed)
RN placed patient on 100% non-rebreather per MD order.  RT will continue to monitor.

## 2019-10-16 NOTE — Progress Notes (Signed)
Patient placed on liter nasal cannula for transport to CT.

## 2019-10-16 NOTE — ED Provider Notes (Signed)
[facility] Provider Note: [name], MD, FACEP  CSN: [id number] MRN: [id number] ARRIVAL: 10/16/19 at [place]: [id number]   CHIEF COMPLAINT  Facial Pain   HISTORY OF PRESENT ILLNESS  10/16/19 4:28 AM Sarah Calhoun is a 24 y.o. female discharged home from the hospital yesterday after being hospitalized approximately 2 weeks for Covid pneumonia.  She was diagnosed with a pneumomediastinum while in the hospital but was discharged without further work-up as her symptoms were improving.  She did not have a CT of the chest.  She is here stating she feels like she has air in her neck and in her face.  She is having pain on the right side of her face and neck which she rates as a 6 out of 10 and describes as dull and throbbing.  It is worse when lying in certain positions.  She was discharged home on oxygen at 3 L.  She states her shortness of breath has been steadily improving since discharge.   Past Medical History:  Diagnosis Date  . Asthma   . Hypothyroidism     History reviewed. No pertinent surgical history.  Family History  Problem Relation Age of Onset  . Hypertension Mother   . Hypertension Father     Social History   Tobacco Use  . Smoking status: Never Smoker  . Smokeless tobacco: Never Used  Substance Use Topics  . Alcohol use: Yes  . Drug use: Never    Prior to Admission medications   Medication Sig Start Date End Date Taking? Authorizing Provider  acetaminophen (TYLENOL) 500 MG tablet Take 500-1,000 mg by mouth every 6 (six) hours as needed for mild pain, fever or headache.    [provider]  albuterol (VENTOLIN HFA) 108 (90 Base) MCG/ACT inhaler Inhale 1-2 puffs into the lungs every 6 (six) hours as needed for wheezing or shortness of breath. 09/30/19   Henderly, Britni A, PA-C  benzonatate (TESSALON) 100 MG capsule Take 1 capsule (100 mg total) by mouth every 8 (eight) hours. 09/30/19   Henderly, Britni A, PA-C  cetirizine (ZYRTEC  ALLERGY) 10 MG tablet Take 10 mg by mouth daily as needed for allergies or rhinitis.    [provider]  chlorpheniramine-HYDROcodone (TUSSIONEX) 10-8 MG/5ML SUER Take 5 mLs by mouth every 12 (twelve) hours as needed for cough. 10/15/19   Bonnielee Haff, MD  fluticasone [facility] 50 MCG/ACT nasal spray Place 2 sprays into both nostrils daily. 09/30/19   Henderly, Britni A, PA-C  furosemide (LASIX) 20 MG tablet Take 1 tablet (20 mg total) by mouth daily for 7 days. 10/15/19 10/22/19  Bonnielee Haff, MD  ibuprofen (ADVIL) 200 MG tablet Take 200-400 mg by mouth every 6 (six) hours as needed for fever, headache or mild pain.    [provider]  levothyroxine (SYNTHROID) 200 MCG tablet Take 200 mcg by mouth daily before breakfast.    [provider]  ondansetron (ZOFRAN ODT) 4 MG disintegrating tablet Take 1 tablet (4 mg total) by mouth every 8 (eight) hours as needed for nausea or vomiting. Patient taking differently: Take 4 mg by mouth every 8 (eight) hours as needed for nausea or vomiting (DISSOLVE ORALLY).  09/30/19   Henderly, Britni A, PA-C  oxyCODONE-acetaminophen (PERCOCET/ROXICET) 5-325 MG tablet Take 1 tablet by mouth every 6 (six) hours as needed for severe pain. 10/02/19   Breck Coons, MD  pantoprazole (PROTONIX) 40 MG tablet Take 1 tablet (40 mg total) by mouth 2 (two) times  daily for 14 days. 10/15/19 10/29/19  Bonnielee Haff, MD  polyethylene glycol (MIRALAX / GLYCOLAX) 17 g packet Take 17 g by mouth daily as needed. 10/15/19   Bonnielee Haff, MD  predniSONE (DELTASONE) 20 MG tablet Take 3 tablets once daily for 3 days followed by 2 tablets once daily for 3 days followed by 1 tablet once daily for 3 days and then stop 10/15/19   Bonnielee Haff, MD    Allergies Patient has no known allergies.   REVIEW OF SYSTEMS  Negative except as noted here or in the History of Present Illness.   PHYSICAL EXAMINATION  Initial Vital Signs Blood pressure 120/82, pulse (!) 112,  temperature 98.7 F (37.1 C), temperature source Oral, resp. rate 18, height 5\' 5"  (1.651 m), weight 133.8 kg, SpO2 94 %.  Examination General: Well-developed, well-nourished female in no acute distress; appearance consistent with age of record HENT: normocephalic; atraumatic; mild subcutaneous emphysema of right side of face Eyes: pupils equal, round and reactive to light; extraocular muscles intact Neck: supple; mild subcutaneous emphysema of right side of neck Heart: regular rate and rhythm Lungs: clear to auscultation bilaterally Abdomen: soft; nondistended; nontender; bowel sounds present Extremities: No deformity; full range of motion Neurologic: Awake, alert and oriented; motor function intact in all extremities and symmetric; no facial droop Skin: Warm and dry Psychiatric: Normal mood and affect   RESULTS  Summary of this visit's results, reviewed and interpreted by myself:   EKG Interpretation  Date/Time:    Ventricular Rate:    PR Interval:    QRS Duration:   QT Interval:    QTC Calculation:   R Axis:     Text Interpretation:        Laboratory Studies: Results for orders placed or performed during the hospital encounter of 10/16/19 (from the past 24 hour(s))  CBC with Differential/Platelet     Status: Abnormal   Collection Time: 10/16/19  5:17 AM  Result Value Ref Range   WBC 17.0 (H) 4.0 - 10.5 K/uL   RBC 5.22 (H) 3.87 - 5.11 MIL/uL   Hemoglobin 14.8 12.0 - 15.0 g/dL   HCT 43.1 36 - 46 %   MCV 82.6 80.0 - 100.0 fL   MCH 28.4 26.0 - 34.0 pg   MCHC 34.3 30.0 - 36.0 g/dL   RDW 13.8 11.5 - 15.5 %   Platelets 453 (H) 150 - 400 K/uL   nRBC 0.0 0.0 - 0.2 %   Neutrophils Relative % 78 %   Neutro Abs 13.3 (H) 1.7 - 7.7 K/uL   Lymphocytes Relative 11 %   Lymphs Abs 1.9 0.7 - 4.0 K/uL   Monocytes Relative 9 %   Monocytes Absolute 1.5 (H) 0 - 1 K/uL   Eosinophils Relative 1 %   Eosinophils Absolute 0.1 0 - 0 K/uL   Basophils Relative 0 %   Basophils Absolute  0.0 0 - 0 K/uL   Immature Granulocytes 1 %   Abs Immature Granulocytes 0.23 (H) 0.00 - 0.07 K/uL  Basic metabolic panel     Status: Abnormal   Collection Time: 10/16/19  5:17 AM  Result Value Ref Range   Sodium 133 (L) 135 - 145 mmol/L   Potassium 3.0 (L) 3.5 - 5.1 mmol/L   Chloride 96 (L) 98 - 111 mmol/L   CO2 25 22 - 32 mmol/L   Glucose, Bld 130 (H) 70 - 99 mg/dL   BUN 16 6 - 20 mg/dL   Creatinine, Ser 0.74 0.44 -  1.00 mg/dL   Calcium 8.8 (L) 8.9 - 10.3 mg/dL   GFR calc non Af Amer >60 >60 mL/min   GFR calc Af Amer >60 >60 mL/min   Anion gap 12 5 - 15   Imaging Studies: CT Soft Tissue Neck Wo Contrast  Result Date: 10/16/2019 CLINICAL DATA:  Pneumomediastinum.  Possible esophageal perforation. EXAM: CT NECK AND CHEST WITHOUT CONTRAST COMPARISON:  CT neck 10/09/2019 FINDINGS: CT NECK FINDINGS Pharynx and larynx: The pharynx and larynx are normal. 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 The amount of air has increased from the prior study. Salivary glands: No inflammation, mass, or stone. Thyroid: Normal. Lymph nodes: None enlarged or abnormal density. Vascular: Negative. Limited intracranial: Normal Visualized orbits: Normal Mastoids and visualized paranasal sinuses: Negative Skeleton: No acute or aggressive process. CT CHEST FINDINGS Cardiovascular: No significant vascular findings. Normal heart size. No pericardial effusion. Mediastinum/Nodes: Large amount of pneumomediastinum. Lungs/Pleura: Intermediate sized left pneumothorax. Widespread hazy airspace opacities. Upper Abdomen: No acute abnormality. Musculoskeletal: No chest wall mass or suspicious bone lesions identified. IMPRESSION: 1. Worsened large volume pneumomediastinum with emphysema extending throughout the deep spaces of the neck. 2. Large left pneumothorax. 3. Widespread multifocal pneumonia. Electronically Signed   By: Ulyses Jarred M.D.   On: 10/16/2019 04:55   CT Chest  Wo Contrast  Result Date: 10/16/2019 CLINICAL DATA:  Pneumomediastinum.  Possible esophageal perforation. EXAM: CT NECK AND CHEST WITHOUT CONTRAST COMPARISON:  CT neck 10/09/2019 FINDINGS: CT NECK FINDINGS Pharynx and larynx: The pharynx and larynx are normal. 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 The amount of air has increased from the prior study. Salivary glands: No inflammation, mass, or stone. Thyroid: Normal. Lymph nodes: None enlarged or abnormal density. Vascular: Negative. Limited intracranial: Normal Visualized orbits: Normal Mastoids and visualized paranasal sinuses: Negative Skeleton: No acute or aggressive process. CT CHEST FINDINGS Cardiovascular: No significant vascular findings. Normal heart size. No pericardial effusion. Mediastinum/Nodes: Large amount of pneumomediastinum. Lungs/Pleura: Intermediate sized left pneumothorax. Widespread hazy airspace opacities. Upper Abdomen: No acute abnormality. Musculoskeletal: No chest wall mass or suspicious bone lesions identified. IMPRESSION: 1. Worsened large volume pneumomediastinum with emphysema extending throughout the deep spaces of the neck. 2. Large left pneumothorax. 3. Widespread multifocal pneumonia. Electronically Signed   By: Ulyses Jarred M.D.   On: 10/16/2019 04:55   DG Chest Port 1 View  Result Date: 10/15/2019 CLINICAL DATA:  COVID pneumonia. EXAM: PORTABLE CHEST 1 VIEW COMPARISON:  10/13/2019. FINDINGS: Findings suggesting pneumomediastinum. Stable cardiomegaly. Diffuse progressive bilateral pulmonary infiltrates. No pleural effusion. Small right apical pneumothorax most likely present. IMPRESSION: 1. Findings suggesting pneumomediastinum noted on today's exam. Tiny right apical pneumothorax also appears to be present on today's exam. 2. Progressive diffuse bilateral pulmonary infiltrates consistent with progressive COVID pneumonia. Electronically Signed   By: Marcello Moores  Register    On: 10/15/2019 07:27    ED COURSE and MDM  Nursing notes, initial and subsequent vitals signs, including pulse oximetry, reviewed and interpreted by myself.  Vitals:   10/16/19 0321 10/16/19 0324  BP:  120/82  Pulse:  (!) 112  Resp:  18  Temp:  98.7 F (37.1 C)  TempSrc:  Oral  SpO2:  94%  Weight: 133.8 kg   Height: 5\' 5"  (1.651 m)    Medications - No data to display  5:36 AM Hospitalist to readmit to their service for observation as the findings are worse than those seen prior to discharge.  Patient placed  on nonrebreather to help resorb the nitrogen component of her extrapulmonary air.  PROCEDURES  Procedures  CRITICAL CARE Performed by: Karen Chafe Virgen Belland Total critical care time: 30 minutes Critical care time was exclusive of separately billable procedures and treating other patients. Critical care was necessary to treat or prevent imminent or life-threatening deterioration. Critical care was time spent personally by me on the following activities: development of treatment plan with patient and/or surrogate as well as nursing, discussions with consultants, evaluation of patient's response to treatment, examination of patient, obtaining history from patient or surrogate, ordering and performing treatments and interventions, ordering and review of laboratory studies, ordering and review of radiographic studies, pulse oximetry and re-evaluation of patient's condition.   ED DIAGNOSES     ICD-10-CM   1. Pneumomediastinum ([place])  J98.2   2. Pneumothorax on left  J93.9   3. Pneumonia due to COVID-19 virus  U07.1    J12.82   4. Nontraumatic subcutaneous emphysema ([place])  J98.2        Keylani Perlstein, Jenny Reichmann, MD 10/16/19 [phone]

## 2019-10-16 NOTE — H&P (Signed)
History and Physical    Sarah Calhoun GXQ:[id number] DOB: [birth year] DOA: 10/16/2019  PCP: Loraine Leriche., MD  Patient coming from: [facility]  I have personally briefly reviewed patient's old medical records in [place]  Chief Complaint:" I feel air in my neck and face"  HPI: Sarah Calhoun is a 24 y.o. female with medical history significant of acute hypoxemic respiratory failure secondary to COVID-19 pneumonia, morbid obesity, hypothyroidism presents to emergency department with air in neck and face and shortness of breath since yesterday.  Patient admitted in the hospital due to COVID-19 pneumonia, pneumomediastinum and acute hypoxemic respiratory failure 2 weeks ago and discharged home yesterday as she was doing better.  Pulmonary was consulted who recommended no intervention as patient was improving.  She finished remdesivir course and discharged home on tapering dose of prednisone and 3 L of oxygen.  After discharge she was doing good however suddenly she developed shortness of breath and air in her neck and face therefore she went to med [facility] for further evaluation and management.  No history of fever, chills, cough, congestion, headache, blurry vision, chest pain, leg swelling, abdominal pain, nausea, vomiting, diarrhea, urinary or bowel changes.  No history of smoking, alcohol, illicit drug use.   ED Course: Upon arrival to ED: Patient tachycardic, tachypneic, hypoxic requiring nonrebreather, leukocytosis of 17.0, platelet: 453.  CT chest and neck shows worsened large volume pneumomediastinum with emphysema extending throughout the deep spaces of the neck.  Large left pneumothorax.  Widespread multifocal pneumonia.  Patient transferred to [facility] for further evaluation and management.  Review of Systems: As per HPI otherwise negative.    Past Medical History:  Diagnosis Date  . Asthma   . Hypothyroidism     History reviewed. No  pertinent surgical history.   reports that she has never smoked. She has never used smokeless tobacco. She reports current alcohol use. She reports that she does not use drugs.  No Known Allergies  Family History  Problem Relation Age of Onset  . Hypertension Mother   . Hypertension Father     Prior to Admission medications   Medication Sig Start Date End Date Taking? Authorizing Provider  acetaminophen (TYLENOL) 500 MG tablet Take 500-1,000 mg by mouth every 6 (six) hours as needed for mild pain, fever or headache.    [provider]  albuterol (VENTOLIN HFA) 108 (90 Base) MCG/ACT inhaler Inhale 1-2 puffs into the lungs every 6 (six) hours as needed for wheezing or shortness of breath. 09/30/19   Henderly, Britni A, PA-C  benzonatate (TESSALON) 100 MG capsule Take 1 capsule (100 mg total) by mouth every 8 (eight) hours. 09/30/19   Henderly, Britni A, PA-C  cetirizine (ZYRTEC ALLERGY) 10 MG tablet Take 10 mg by mouth daily as needed for allergies or rhinitis.    [provider]  chlorpheniramine-HYDROcodone (TUSSIONEX) 10-8 MG/5ML SUER Take 5 mLs by mouth every 12 (twelve) hours as needed for cough. 10/15/19   Bonnielee Haff, MD  fluticasone [facility]) 50 MCG/ACT nasal spray Place 2 sprays into both nostrils daily. 09/30/19   Henderly, Britni A, PA-C  furosemide (LASIX) 20 MG tablet Take 1 tablet (20 mg total) by mouth daily for 7 days. 10/15/19 10/22/19  Bonnielee Haff, MD  ibuprofen (ADVIL) 200 MG tablet Take 200-400 mg by mouth every 6 (six) hours as needed for fever, headache or mild pain.    [provider]  levothyroxine (SYNTHROID) 200 MCG tablet Take  200 mcg by mouth daily before breakfast.    [provider]  ondansetron (ZOFRAN ODT) 4 MG disintegrating tablet Take 1 tablet (4 mg total) by mouth every 8 (eight) hours as needed for nausea or vomiting. Patient taking differently: Take 4 mg by mouth every 8 (eight) hours as needed for nausea or vomiting  (DISSOLVE ORALLY).  09/30/19   Henderly, Britni A, PA-C  oxyCODONE-acetaminophen (PERCOCET/ROXICET) 5-325 MG tablet Take 1 tablet by mouth every 6 (six) hours as needed for severe pain. 10/02/19   Breck Coons, MD  pantoprazole (PROTONIX) 40 MG tablet Take 1 tablet (40 mg total) by mouth 2 (two) times daily for 14 days. 10/15/19 10/29/19  Bonnielee Haff, MD  polyethylene glycol (MIRALAX / GLYCOLAX) 17 g packet Take 17 g by mouth daily as needed. 10/15/19   Bonnielee Haff, MD  predniSONE (DELTASONE) 20 MG tablet Take 3 tablets once daily for 3 days followed by 2 tablets once daily for 3 days followed by 1 tablet once daily for 3 days and then stop 10/15/19   Bonnielee Haff, MD    Physical Exam: Vitals:   10/16/19 1100 10/16/19 1300 10/16/19 1400 10/16/19 1437  BP: 98/60   115/65  Pulse: 96 (!) 106 (!) 104 (!) 116  Resp: (!) 25 (!) 24 (!) 27 (!) 26  Temp:      TempSrc:    Oral  SpO2: 98% 93% 99% 100%  Weight:      Height:        Constitutional: In respiratory distress, on nonrebreather, obese Eyes: PERRL, lids and conjunctivae normal ENMT: Mucous membranes are moist. Posterior pharynx clear of any exudate or lesions.Normal dentition.  Neck: normal, supple, no masses, no thyromegaly Respiratory: Decreased breath sound noted on left side..  Cardiovascular: Tachycardic, no murmurs / rubs / gallops. No extremity edema. 2+ pedal pulses. No carotid bruits.  Abdomen: no tenderness, no masses palpated. No hepatosplenomegaly. Bowel sounds positive.  Musculoskeletal: no clubbing / cyanosis. No joint deformity upper and lower extremities. Good ROM, no contractures. Normal muscle tone.  Skin: no rashes, lesions, ulcers. No induration Neurologic: CN 2-12 grossly intact. Sensation intact, DTR normal. Strength 5/5 in all 4.  Psychiatric: Normal judgment and insight. Alert and oriented x 3. Normal mood.    Labs on Admission: I have personally reviewed following labs and imaging studies  CBC: Recent Labs    Lab November 02, 2019 0339 10/11/19 1024 10/12/19 0515 10/13/19 0628 10/16/19 0517  WBC 16.4* 18.9* 16.7* 17.5* 17.0*  NEUTROABS 14.4* 17.2* 14.4* 14.7* 13.3*  HGB 14.9 15.6* 15.7* 15.6* 14.8  HCT 45.8 47.0* 46.7* 46.1* 43.1  MCV 85.0 85.5 84.9 84.6 82.6  PLT 431* 515* 546* 485* 616*   Basic Metabolic Panel: Recent Labs  Lab 2019-11-02 0339 2019-11-02 0339 10/11/19 1024 10/12/19 0515 10/13/19 0628 10/14/19 1331 10/16/19 0517  NA 138   < > 138 135 136 137 133*  K 4.9   < > 4.2 3.9 4.3 4.1 3.0*  CL 101   < > 100 100 99 100 96*  CO2 26   < > 25 22 25 24 25   GLUCOSE 254*   < > 222* 166* 136* 219* 130*  BUN 15   < > 15 16 17 18 16   CREATININE 0.98   < > 0.94 0.81 0.91 0.93 0.74  CALCIUM 9.2   < > 9.3 9.2 9.2 9.5 8.8*  MG 2.7*  --  2.6* 2.5* 2.5*  --   --   PHOS 4.4  --  2.9 3.7 2.9  --   --    < > = values in this interval not displayed.   GFR: Estimated Creatinine Clearance: 151.4 mL/min (by C-G formula based on SCr of 0.74 mg/dL). Liver Function Tests: Recent Labs  Lab 10/10/19 0339 10/11/19 1024 10/12/19 0515 10/13/19 0628  AST 21 18 18 18   ALT 25 23 23 19   ALKPHOS 44 44 45 45  BILITOT 0.9 1.0 1.3* 1.8*  PROT 7.6 7.6 7.5 7.5  ALBUMIN 3.9 4.0 3.5 3.9   No results for input(s): LIPASE, AMYLASE in the last 168 hours. No results for input(s): AMMONIA in the last 168 hours. Coagulation Profile: No results for input(s): INR, PROTIME in the last 168 hours. Cardiac Enzymes: No results for input(s): CKTOTAL, CKMB, CKMBINDEX, TROPONINI in the last 168 hours. BNP (last 3 results) No results for input(s): PROBNP in the last 8760 hours. HbA1C: No results for input(s): HGBA1C in the last 72 hours. CBG: Recent Labs  Lab 10/14/19 1159 10/14/19 1611 10/14/19 2140 10/15/19 0733 10/15/19 1130  GLUCAP 211* 188* 185* 131* 187*   Lipid Profile: No results for input(s): CHOL, HDL, LDLCALC, TRIG, CHOLHDL, LDLDIRECT in the last 72 hours. Thyroid Function Tests: No results for  input(s): TSH, T4TOTAL, FREET4, T3FREE, THYROIDAB in the last 72 hours. Anemia Panel: No results for input(s): VITAMINB12, FOLATE, FERRITIN, TIBC, IRON, RETICCTPCT in the last 72 hours. Urine analysis:    Component Value Date/Time   COLORURINE YELLOW 10/13/2019 1658   APPEARANCEUR CLEAR 10/13/2019 1658   LABSPEC 1.011 10/13/2019 1658   PHURINE 5.0 10/13/2019 1658   GLUCOSEU >=500 (A) 10/13/2019 1658   HGBUR SMALL (A) 10/13/2019 1658   BILIRUBINUR NEGATIVE 10/13/2019 1658   KETONESUR NEGATIVE 10/13/2019 1658   PROTEINUR NEGATIVE 10/13/2019 1658   NITRITE NEGATIVE 10/13/2019 1658   LEUKOCYTESUR NEGATIVE 10/13/2019 1658    Radiological Exams on Admission: CT Soft Tissue Neck Wo Contrast  Result Date: 10/16/2019 CLINICAL DATA:  Pneumomediastinum.  Possible esophageal perforation. EXAM: CT NECK AND CHEST WITHOUT CONTRAST COMPARISON:  CT neck 10/09/2019 FINDINGS: CT NECK FINDINGS Pharynx and larynx: The pharynx and larynx are normal. 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 The amount of air has increased from the prior study. Salivary glands: No inflammation, mass, or stone. Thyroid: Normal. Lymph nodes: None enlarged or abnormal density. Vascular: Negative. Limited intracranial: Normal Visualized orbits: Normal Mastoids and visualized paranasal sinuses: Negative Skeleton: No acute or aggressive process. CT CHEST FINDINGS Cardiovascular: No significant vascular findings. Normal heart size. No pericardial effusion. Mediastinum/Nodes: Large amount of pneumomediastinum. Lungs/Pleura: Intermediate sized left pneumothorax. Widespread hazy airspace opacities. Upper Abdomen: No acute abnormality. Musculoskeletal: No chest wall mass or suspicious bone lesions identified. IMPRESSION: 1. Worsened large volume pneumomediastinum with emphysema extending throughout the deep spaces of the neck. 2. Large left pneumothorax. 3. Widespread multifocal pneumonia.  Electronically Signed   By: Ulyses Jarred M.D.   On: 10/16/2019 04:55   CT Chest Wo Contrast  Result Date: 10/16/2019 CLINICAL DATA:  Pneumomediastinum.  Possible esophageal perforation. EXAM: CT NECK AND CHEST WITHOUT CONTRAST COMPARISON:  CT neck 10/09/2019 FINDINGS: CT NECK FINDINGS Pharynx and larynx: The pharynx and larynx are normal. 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 The amount of air has increased from the prior study. Salivary glands: No inflammation, mass, or stone. Thyroid: Normal. Lymph nodes: None enlarged or abnormal density. Vascular: Negative. Limited intracranial: Normal Visualized orbits: Normal Mastoids and visualized paranasal sinuses: Negative Skeleton:  No acute or aggressive process. CT CHEST FINDINGS Cardiovascular: No significant vascular findings. Normal heart size. No pericardial effusion. Mediastinum/Nodes: Large amount of pneumomediastinum. Lungs/Pleura: Intermediate sized left pneumothorax. Widespread hazy airspace opacities. Upper Abdomen: No acute abnormality. Musculoskeletal: No chest wall mass or suspicious bone lesions identified. IMPRESSION: 1. Worsened large volume pneumomediastinum with emphysema extending throughout the deep spaces of the neck. 2. Large left pneumothorax. 3. Widespread multifocal pneumonia. Electronically Signed   By: Ulyses Jarred M.D.   On: 10/16/2019 04:55   DG Chest Port 1 View  Result Date: 10/15/2019 CLINICAL DATA:  COVID pneumonia. EXAM: PORTABLE CHEST 1 VIEW COMPARISON:  10/13/2019. FINDINGS: Findings suggesting pneumomediastinum. Stable cardiomegaly. Diffuse progressive bilateral pulmonary infiltrates. No pleural effusion. Small right apical pneumothorax most likely present. IMPRESSION: 1. Findings suggesting pneumomediastinum noted on today's exam. Tiny right apical pneumothorax also appears to be present on today's exam. 2. Progressive diffuse bilateral pulmonary infiltrates  consistent with progressive COVID pneumonia. Electronically Signed   By: [place]   On: 10/15/2019 07:27   Assessment/Plan Principal Problem:   Pneumomediastinum ([place]) Active Problems:   Obesity, Class III, BMI 40-49.9 (morbid obesity) ([place])   Acute hypoxemic respiratory failure due to COVID-19 [facility])   Hypothyroidism   Leukocytosis   Thrombocytosis ([facility])    Acute hypoxemic respiratory failure: -Secondary to pneumomediastinum/large left pneumothorax and underlying COVID-19 pneumonia -Patient is currently requiring nonrebreather.  She is tachycardic and tachypneic.  Afebrile with leukocytosis of 17,000.  Reviewed CT chest and soft tissue neck. -Admit patient to stepdown unit for close monitoring.  On continuous pulse ox. -Consult PCCM for chest tube insertion. -Check CRP, BMP and procalcitonin level.  Continue  prednisone. -Finished remdesivir course recently. -Monitor inflammatory markers.  Hypothyroidism: Continue levothyroxine  Leukocytosis: Likely secondary to steroids.  She is afebrile. -Repeat CBC tomorrow a.m.  Thrombocytosis: Likely secondary to COVID-19 infection. -Repeat CBC tomorrow a.m.  Morbid obesity with BMI of 49. -Diet modification/exercise and weight loss recommended.  New diagnosis diabetes mellitus: -A1c 6.6% -Could be secondary to prolonged use of steroids. -Monitor blood sugar closely.  Follow-up outpatient.  GERD: Continue PPI  Unable to safely start patient's home medication as med reconciliation is pending by pharmacy.  DVT prophylaxis: Lovenox/SCD Code Status: Full code Family Communication: None present at bedside.  Plan of care discussed with patient in length and she verbalized understanding and agreed with it. Disposition Plan: Home in 3 to 4 days Consults called: PCCM Admission status: Inpatient   Mckinley Jewel MD [facility]  If 7PM-7AM, please contact night-coverage [email or web address] Password [email or web address]  10/16/2019, 3:26 PM

## 2019-10-16 NOTE — ED Notes (Signed)
Pt hit call bell asking for RT and RN- RN entered room- pt stated she felt like O2 was too high and wanted something to spit in. RN brought pt emesis bag to spit in, adjusted bed and called RT. RT stated they were busy and that pt setting were on the lowest they could go, pt informed and understood

## 2019-10-17 ENCOUNTER — Inpatient Hospital Stay (HOSPITAL_COMMUNITY): Payer: BC Managed Care – PPO

## 2019-10-17 DIAGNOSIS — J9312 Secondary spontaneous pneumothorax: Secondary | ICD-10-CM

## 2019-10-17 LAB — CBC WITH DIFFERENTIAL/PLATELET
Abs Immature Granulocytes: 0.1 10*3/uL — ABNORMAL HIGH (ref 0.00–0.07)
Basophils Absolute: 0 10*3/uL (ref 0.0–0.1)
Basophils Relative: 0 %
Eosinophils Absolute: 0 10*3/uL (ref 0.0–0.5)
Eosinophils Relative: 0 %
HCT: 42.2 % (ref 36.0–46.0)
Hemoglobin: 14 g/dL (ref 12.0–15.0)
Immature Granulocytes: 1 %
Lymphocytes Relative: 4 %
Lymphs Abs: 0.6 10*3/uL — ABNORMAL LOW (ref 0.7–4.0)
MCH: 28.1 pg (ref 26.0–34.0)
MCHC: 33.2 g/dL (ref 30.0–36.0)
MCV: 84.6 fL (ref 80.0–100.0)
Monocytes Absolute: 0.8 10*3/uL (ref 0.1–1.0)
Monocytes Relative: 6 %
Neutro Abs: 13.2 10*3/uL — ABNORMAL HIGH (ref 1.7–7.7)
Neutrophils Relative %: 89 %
Platelets: 462 10*3/uL — ABNORMAL HIGH (ref 150–400)
RBC: 4.99 MIL/uL (ref 3.87–5.11)
RDW: 14.3 % (ref 11.5–15.5)
WBC: 14.6 10*3/uL — ABNORMAL HIGH (ref 4.0–10.5)
nRBC: 0 % (ref 0.0–0.2)

## 2019-10-17 LAB — COMPREHENSIVE METABOLIC PANEL
ALT: 22 U/L (ref 0–44)
AST: 15 U/L (ref 15–41)
Albumin: 3.6 g/dL (ref 3.5–5.0)
Alkaline Phosphatase: 52 U/L (ref 38–126)
Anion gap: 12 (ref 5–15)
BUN: 12 mg/dL (ref 6–20)
CO2: 25 mmol/L (ref 22–32)
Calcium: 9 mg/dL (ref 8.9–10.3)
Chloride: 99 mmol/L (ref 98–111)
Creatinine, Ser: 0.8 mg/dL (ref 0.44–1.00)
GFR calc Af Amer: >60 mL/min (ref >60)
GFR calc non Af Amer: >60 mL/min (ref >60)
Glucose, Bld: 161 mg/dL — ABNORMAL HIGH (ref 70–99)
Potassium: 4 mmol/L (ref 3.5–5.1)
Sodium: 136 mmol/L (ref 135–145)
Total Bilirubin: 1.5 mg/dL — ABNORMAL HIGH (ref 0.3–1.2)
Total Protein: 6.9 g/dL (ref 6.5–8.1)

## 2019-10-17 LAB — CBG MONITORING, ED
Glucose-Capillary: 139 mg/dL — ABNORMAL HIGH (ref 70–99)
Glucose-Capillary: 174 mg/dL — ABNORMAL HIGH (ref 70–99)
Glucose-Capillary: 192 mg/dL — ABNORMAL HIGH (ref 70–99)

## 2019-10-17 LAB — MAGNESIUM: Magnesium: 2.6 mg/dL — ABNORMAL HIGH (ref 1.7–2.4)

## 2019-10-17 LAB — PHOSPHORUS: Phosphorus: 4.3 mg/dL (ref 2.5–4.6)

## 2019-10-17 LAB — C-REACTIVE PROTEIN: CRP: 1 mg/dL — ABNORMAL HIGH (ref <1.0)

## 2019-10-17 MED ORDER — SENNOSIDES-DOCUSATE SODIUM 8.6-50 MG PO TABS: 2.0000 | ORAL_TABLET | Freq: Every evening | ORAL | Status: DC | PRN

## 2019-10-17 MED ORDER — BENZONATATE 100 MG PO CAPS
100.0000 mg | ORAL_CAPSULE | Freq: Three times a day (TID) | ORAL | Status: DC
  Administered 2019-10-17 – 2019-10-20 (×9): 100 mg via ORAL
  Filled 2019-10-17 (×10): qty 1

## 2019-10-17 MED ORDER — INSULIN ASPART 100 UNIT/ML ~~LOC~~ SOLN: 0.0000 [IU] | Freq: Every day | SUBCUTANEOUS | Status: DC

## 2019-10-17 MED ORDER — POLYETHYLENE GLYCOL 3350 17 G PO PACK: 17.0000 g | PACK | Freq: Every day | ORAL | Status: DC | PRN

## 2019-10-17 MED ORDER — PANTOPRAZOLE SODIUM 40 MG PO TBEC
40.0000 mg | DELAYED_RELEASE_TABLET | Freq: Two times a day (BID) | ORAL | Status: DC
  Administered 2019-10-17 – 2019-10-20 (×7): 40 mg via ORAL
  Filled 2019-10-17 (×6): qty 1

## 2019-10-17 MED ORDER — LEVOTHYROXINE SODIUM 100 MCG PO TABS
200.0000 ug | ORAL_TABLET | Freq: Every day | ORAL | Status: DC
  Administered 2019-10-17 – 2019-10-20 (×4): 200 ug via ORAL
  Filled 2019-10-17 (×4): qty 2

## 2019-10-17 MED ORDER — IPRATROPIUM-ALBUTEROL 20-100 MCG/ACT IN AERS
1.0000 | INHALATION_SPRAY | Freq: Four times a day (QID) | RESPIRATORY_TRACT | Status: DC
  Administered 2019-10-17 – 2019-10-20 (×14): 1 via RESPIRATORY_TRACT
  Filled 2019-10-17 (×2): qty 4

## 2019-10-17 MED ORDER — INSULIN ASPART 100 UNIT/ML ~~LOC~~ SOLN
0.0000 [IU] | Freq: Three times a day (TID) | SUBCUTANEOUS | Status: DC
  Administered 2019-10-17: 1 [IU] via SUBCUTANEOUS
  Administered 2019-10-18: 2 [IU] via SUBCUTANEOUS
  Administered 2019-10-18 – 2019-10-19 (×3): 1 [IU] via SUBCUTANEOUS

## 2019-10-17 NOTE — ED Notes (Signed)
Pt reporting difficulty using nasal canula d/t pain, able to tolerate 10L HFNC at this time. Shortly after, calls RN again to report chest tenderness and tightness in chest at area of L chest tube, states she feels like she did at home prior to presenting to ED. Vital signs unchanged 94% on 10L, A&Ox4. Paged CCM, e-link responds and provides verbal orders to reattach to suction and repeat cxr.

## 2019-10-17 NOTE — Progress Notes (Signed)
PROGRESS NOTE    Sarah Calhoun  FKC:[id number] DOB: 11/22/1995 DOA: 10/16/2019 PCP: Loraine Leriche., MD   Brief Narrative:  24 year old with history of hypothyroidism, morbid obesity admitted for hypoxic respiratory failure secondary to COVID-19 pneumonia. She was admitted here 2 weeks ago (8/23-9/3) with COVID-19 pneumonia and pneumomediastinum, discharged a day prior to admission but developed sudden shortness of breath therefore came back to the hospital. CT chest and neck showed worsening large pneumomediastinum with emphysema, large left pneumothorax and widespread multifocal pneumonia. Pulmonary was consulted, chest tube placed on 9/4.   Assessment & Plan:   Principal Problem:   Pneumomediastinum ([place]) Active Problems:   Obesity, Class III, BMI 40-49.9 (morbid obesity) (HCC)   Acute hypoxemic respiratory failure due to COVID-19 [facility])   Hypothyroidism   Leukocytosis   Thrombocytosis (HCC)   Acute respiratory distress secondary to large pneumomediastinum with emphysema and large left-sided pneumothorax COVID-19 pneumonia with bilateral infiltrates-multifocal -COVID-19 positive test-09/30/2019 -Completed course of remdesivir. Currently on daily prednisone -Chest tube management per pulmonary team-suctioning turned off, plan is to repeat chest x-ray later today, if stable repeat another chest x-ray tomorrow morning.  If repeat chest x-ray looks patient has recurrence of her pneumothorax and the chest tube will be placed to suction again. -Discussed with pulmonary team -Out of bed to chair, incentive spirometer, flutter valve. Bronchodilators as needed. -Supplemental oxygen. -Repeat chest x-ray today-no definitive pneumothorax, left-sided chest tube, bilateral multifocal pneumonia  Hypothyroidism -Daily Synthroid  Diabetes mellitus type 2 -Recent hemoglobin A1c 6.6. Sliding scale Accu-Chek while in the hospital.  GERD -PPI  Constipation -Bowel regimen  Morbid obesity  with BMI greater than 45 -Weight loss diet and exercise    DVT prophylaxis: Lovenox Code Status: Full code Family Communication: Patient states she will update them herself, does not want me to call  Status is: Inpatient  Remains inpatient appropriate because:Inpatient level of care appropriate due to severity of illness   Dispo: The patient is from: Home              Anticipated d/c is to: Home              Anticipated d/c date is: 2 days              Patient currently is not medically stable to d/c. Given the extent of pneumomediastinum and pneumothorax patient has chest tube in place. Maintain hospital stay until removed by pulmonary team.   Body mass index is 49.09 kg/m.       Consultants:   Pulmonary   Subjective: Feels much better but she still slightly tachycardic.  No new complaints.  Review of Systems Otherwise negative except as per HPI, including: General: Denies fever, chills, night sweats or unintended weight loss. Resp: Denies cough, wheezing, shortness of breath. Cardiac: Denies chest pain, palpitations, orthopnea, paroxysmal nocturnal dyspnea. GI: Denies abdominal pain, nausea, vomiting, diarrhea or constipation GU: Denies dysuria, frequency, hesitancy or incontinence MS: Denies muscle aches, joint pain or swelling Neuro: Denies headache, neurologic deficits (focal weakness, numbness, tingling), abnormal gait Psych: Denies anxiety, depression, SI/HI/AVH Skin: Denies new rashes or lesions ID: Denies sick contacts, exotic exposures, travel  Examination:  General exam: Appears calm and comfortable  Respiratory system: Clear to auscultation. Respiratory effort normal. Cardiovascular system: Sinus tachycardia Gastrointestinal system: Abdomen is nondistended, soft and nontender. No organomegaly or masses felt. Normal bowel sounds heard. Central nervous system: Alert and oriented. No focal neurological deficits. Extremities: Symmetric 5 x 5  power. Skin: No rashes,  lesions or ulcers Psychiatry: Judgement and insight appear normal. Mood & affect appropriate.   Chest tube in place  Objective: Vitals:   10/17/19 0300 10/17/19 0400 10/17/19 0501 10/17/19 0600  BP: (!) 136/94 (!) 168/103 (!) 140/101 137/90  Pulse: 91 99 96 96  Resp: 15 12 (!) 24 (!) 23  Temp:      TempSrc:      SpO2: 96% 92% 97% 96%  Weight:      Height:       No intake or output data in the 24 hours ending 10/17/19 0910 Filed Weights   10/16/19 0321  Weight: 133.8 kg     Data Reviewed:   CBC: Recent Labs  Lab 10/11/19 1024 10/11/19 1024 10/12/19 0515 10/13/19 0628 10/16/19 0517 10/16/19 1844 10/17/19 0515  WBC 18.9*   < > 16.7* 17.5* 17.0* 14.5* 14.6*  NEUTROABS 17.2*  --  14.4* 14.7* 13.3*  --  13.2*  HGB 15.6*   < > 15.7* 15.6* 14.8 15.6* 14.0  HCT 47.0*   < > 46.7* 46.1* 43.1 46.1* 42.2  MCV 85.5   < > 84.9 84.6 82.6 85.1 84.6  PLT 515*   < > 546* 485* 453* 421* 462*   < > = values in this interval not displayed.   Basic Metabolic Panel: Recent Labs  Lab 10/11/19 1024 10/11/19 1024 10/12/19 0515 10/13/19 0628 10/14/19 1331 10/16/19 0517 10/17/19 0515  NA 138   < > 135 136 137 133* 136  K 4.2   < > 3.9 4.3 4.1 3.0* 4.0  CL 100   < > 100 99 100 96* 99  CO2 25   < > 22 25 24 25 25   GLUCOSE 222*   < > 166* 136* 219* 130* 161*  BUN 15   < > 16 17 18 16 12   CREATININE 0.94   < > 0.81 0.91 0.93 0.74 0.80  CALCIUM 9.3   < > 9.2 9.2 9.5 8.8* 9.0  MG 2.6*  --  2.5* 2.5*  --   --  2.6*  PHOS 2.9  --  3.7 2.9  --   --  4.3   < > = values in this interval not displayed.   GFR: Estimated Creatinine Clearance: 151.4 mL/min (by C-G formula based on SCr of 0.8 mg/dL). Liver Function Tests: Recent Labs  Lab 10/11/19 1024 10/12/19 0515 10/13/19 0628 10/17/19 0515  AST 18 18 18 15   ALT 23 23 19 22   ALKPHOS 44 45 45 52  BILITOT 1.0 1.3* 1.8* 1.5*  PROT 7.6 7.5 7.5 6.9  ALBUMIN 4.0 3.5 3.9 3.6   No results for input(s): LIPASE,  AMYLASE in the last 168 hours. No results for input(s): AMMONIA in the last 168 hours. Coagulation Profile: No results for input(s): INR, PROTIME in the last 168 hours. Cardiac Enzymes: No results for input(s): CKTOTAL, CKMB, CKMBINDEX, TROPONINI in the last 168 hours. BNP (last 3 results) No results for input(s): PROBNP in the last 8760 hours. HbA1C: No results for input(s): HGBA1C in the last 72 hours. CBG: Recent Labs  Lab 10/14/19 1159 10/14/19 1611 10/14/19 2140 10/15/19 0733 10/15/19 1130  GLUCAP 211* 188* 185* 131* 187*   Lipid Profile: No results for input(s): CHOL, HDL, LDLCALC, TRIG, CHOLHDL, LDLDIRECT in the last 72 hours. Thyroid Function Tests: No results for input(s): TSH, T4TOTAL, FREET4, T3FREE, THYROIDAB in the last 72 hours. Anemia Panel: No results for input(s): VITAMINB12, FOLATE, FERRITIN, TIBC, IRON, RETICCTPCT in the last 72 hours.  Sepsis Labs: No results for input(s): PROCALCITON, LATICACIDVEN in the last 168 hours.  No results found for this or any previous visit (from the past 240 hour(s)).       Radiology Studies: CT Soft Tissue Neck Wo Contrast  Result Date: 10/16/2019 CLINICAL DATA:  Pneumomediastinum.  Possible esophageal perforation. EXAM: CT NECK AND CHEST WITHOUT CONTRAST COMPARISON:  CT neck 10/09/2019 FINDINGS: CT NECK FINDINGS Pharynx and larynx: The pharynx and larynx are normal. 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 The amount of air has increased from the prior study. Salivary glands: No inflammation, mass, or stone. Thyroid: Normal. Lymph nodes: None enlarged or abnormal density. Vascular: Negative. Limited intracranial: Normal Visualized orbits: Normal Mastoids and visualized paranasal sinuses: Negative Skeleton: No acute or aggressive process. CT CHEST FINDINGS Cardiovascular: No significant vascular findings. Normal heart size. No pericardial effusion. Mediastinum/Nodes: Large  amount of pneumomediastinum. Lungs/Pleura: Intermediate sized left pneumothorax. Widespread hazy airspace opacities. Upper Abdomen: No acute abnormality. Musculoskeletal: No chest wall mass or suspicious bone lesions identified. IMPRESSION: 1. Worsened large volume pneumomediastinum with emphysema extending throughout the deep spaces of the neck. 2. Large left pneumothorax. 3. Widespread multifocal pneumonia. Electronically Signed   By: Ulyses Jarred M.D.   On: 10/16/2019 04:55   CT Chest Wo Contrast  Result Date: 10/16/2019 CLINICAL DATA:  Pneumomediastinum.  Possible esophageal perforation. EXAM: CT NECK AND CHEST WITHOUT CONTRAST COMPARISON:  CT neck 10/09/2019 FINDINGS: CT NECK FINDINGS Pharynx and larynx: The pharynx and larynx are normal. 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 The amount of air has increased from the prior study. Salivary glands: No inflammation, mass, or stone. Thyroid: Normal. Lymph nodes: None enlarged or abnormal density. Vascular: Negative. Limited intracranial: Normal Visualized orbits: Normal Mastoids and visualized paranasal sinuses: Negative Skeleton: No acute or aggressive process. CT CHEST FINDINGS Cardiovascular: No significant vascular findings. Normal heart size. No pericardial effusion. Mediastinum/Nodes: Large amount of pneumomediastinum. Lungs/Pleura: Intermediate sized left pneumothorax. Widespread hazy airspace opacities. Upper Abdomen: No acute abnormality. Musculoskeletal: No chest wall mass or suspicious bone lesions identified. IMPRESSION: 1. Worsened large volume pneumomediastinum with emphysema extending throughout the deep spaces of the neck. 2. Large left pneumothorax. 3. Widespread multifocal pneumonia. Electronically Signed   By: Ulyses Jarred M.D.   On: 10/16/2019 04:55   DG CHEST PORT 1 VIEW  Result Date: 10/17/2019 CLINICAL DATA:  COVID-19 positive. EXAM: PORTABLE CHEST 1 VIEW COMPARISON:   October 16, 2019. FINDINGS: Stable cardiomediastinal silhouette. Stable patchy airspace opacities are noted bilaterally consistent with multifocal pneumonia. Stable position of left-sided chest tube. No definite pneumothorax or pleural effusion is noted. Bony thorax is unremarkable. IMPRESSION: Stable bilateral multifocal pneumonia. Stable position of left-sided chest tube. No definite pneumothorax is noted. Electronically Signed   By: Marijo Conception M.D.   On: 10/17/2019 08:22   DG CHEST PORT 1 VIEW  Result Date: 10/16/2019 CLINICAL DATA:  Left-sided chest tube placement. EXAM: PORTABLE CHEST 1 VIEW COMPARISON:  Earlier the same day. FINDINGS: There has been interval placement of left-sided chest tube. Radiographic resolution of the previously demonstrated left-sided pneumothorax. The pneumomediastinum demonstrated by chest CT earlier today is barely visualized radiographically. Soft tissue emphysema within the neck is seen. Cardiomediastinal silhouette is normal. Mediastinal contours appear intact. Low lung volumes with worsening bilateral patchy areas of airspace consolidation with lower lobe predominance. Osseous structures are without acute abnormality. Soft tissues are grossly normal. IMPRESSION: 1. Interval placement of left-sided chest tube with  radiographic resolution of the previously demonstrated left-sided pneumothorax. 2. Low lung volumes with worsening bilateral patchy areas of airspace consolidation with lower lobe predominance. 3. Soft tissue emphysema within the neck. Poor visualization of the known pneumomediastinum demonstrated by chest CT earlier today. Electronically Signed   By: Fidela Salisbury M.D.   On: 10/16/2019 17:51   DG CHEST PORT 1 VIEW  Result Date: 10/16/2019 CLINICAL DATA:  Shortness of breath. EXAM: PORTABLE CHEST 1 VIEW COMPARISON:  October 15, 2019 FINDINGS: Cardiomediastinal silhouette is normal. Mediastinal contours appear intact. Persistent patchy bilateral with  lower lobe predominance airspace consolidation. The pneumomediastinum demonstrated by CT earlier today is not well seen radiographically. There is moderate left pneumothorax. Osseous structures are without acute abnormality. Soft tissues are grossly normal. IMPRESSION: 1. Moderate left pneumothorax. 2. Persistent patchy bilateral with lower lobe predominance airspace consolidation consistent with COVID-19 pneumonia. 3. The pneumomediastinum demonstrated by chest CT earlier today is not well seen radiographically. Electronically Signed   By: Fidela Salisbury M.D.   On: 10/16/2019 16:17        Scheduled Meds: . enoxaparin (LOVENOX) injection  60 mg Subcutaneous Q24H  . lidocaine (PF)  15 mL Intradermal Once  . predniSONE  60 mg Oral Q breakfast  . sodium chloride flush  10 mL Intracatheter Q8H   Continuous Infusions:   LOS: 1 day   Time spent=35 mins    Kandis Henry Arsenio Loader, MD [facility]  If 7PM-7AM, please contact night-coverage  10/17/2019, 9:10 AM

## 2019-10-17 NOTE — ED Notes (Signed)
Provided pt with lunch tray

## 2019-10-17 NOTE — Progress Notes (Signed)
NAMEYue Calhoun, MRN:  [id number], DOB:  1995/11/07, LOS: 1 ADMISSION DATE:  10/16/2019, CONSULTATION DATE:  9/4 REFERRING MD:  Doristine Bosworth - TRH  CHIEF COMPLAINT:  Air in face and neck   Brief History   24 y o F recently discharged 9/3 after covid admission, who presented to [facility] 9/4 with "air in face and neck" CT reveals worse pneumomediastinum and now with new L ptx ED to Ed transfer PCCM consulted and chest tube was placed  Past Medical History  COVID-19 PNA Obesity Hypothyroidism [place] [facility] Events   9/4 ED to ED transfer from [facility] for pneumomediastinum. Admitted to [facility]. After admit, while in ED, PCCm consulted for pneumomediastinum. PCCM noted L ptx on CT chest, placed L pigtail.   Consults:  PCCM   Procedures:  9/4 L pigtail >   Significant Diagnostic Tests:  9/4 CT chest> L PTX, large pneumomediastinum. Emphysema extending throughout neck. Multifocal PNA   Micro Data:    Antimicrobials:    Interim history/subjective:  Patient remains on high flow, feels much better, denies chest pain shortness of breath, cough or fever.  Objective   Blood pressure (!) 179/95, pulse (!) 120, temperature 98.3 F (36.8 C), temperature source Oral, resp. rate (!) 28, height 5\' 5"  (1.651 m), weight 133.8 kg, SpO2 (!) 87 %.    FiO2 (%):  [60 %-100 %] 98 %  No intake or output data in the 24 hours ending 10/17/19 1328 Filed Weights   10/16/19 0321  Weight: 133.8 kg    Examination: General: Obese young adult F, reclined in bed HENT: [place]. No crepitus Trachea midline Lungs: Coarse breath sounds bilaterally. Cardiovascular: Regular rate and rhythm, no murmur Abdomen: obese soft round ndnt  Extremities: Symmetrical bulk and tone no obvious joint deformity  Neuro: AAOx4 following commands PERRLA Skin: No rash  Resolved Hospital Problem list     Assessment & Plan:   Acute hypoxic respiratory failure in setting of COVID 19 PNA L sided pneumothorax s/p left  anterior 14 French chest tube placement Pneumomediastinum, improved Morbid obesity  Left anterior chest tube is in place, x-ray chest was repeated this morning which showed left pneumothorax was resolved. I turned off suction on chest tube, keep it under waterseal and repeat x-ray chest in 6 hours if no pneumothorax then repeat x-ray chest in the morning and will consider taking her chest tube If patient will redevelop pneumothorax, will resume suction on chest tube. Switch high flow oxygen to regular nasal cannula with O2 sat goal of 88 to 92%   PCCM will follow for chest tube  Best practice:  Diet: PO  Pain/Anxiety/Delirium protocol (if indicated): APAP  VAP protocol (if indicated): na DVT prophylaxis: lovenox  GI prophylaxis: n/a Glucose control: monitor Mobility: PT OT  Code Status: Full  Family Communication: pt updated Disposition: Per primary team  Labs   CBC: Recent Labs  Lab 10/11/19 1024 10/11/19 1024 10/12/19 0515 10/13/19 0628 10/16/19 0517 10/16/19 1844 10/17/19 0515  WBC 18.9*   < > 16.7* 17.5* 17.0* 14.5* 14.6*  NEUTROABS 17.2*  --  14.4* 14.7* 13.3*  --  13.2*  HGB 15.6*   < > 15.7* 15.6* 14.8 15.6* 14.0  HCT 47.0*   < > 46.7* 46.1* 43.1 46.1* 42.2  MCV 85.5   < > 84.9 84.6 82.6 85.1 84.6  PLT 515*   < > 546* 485* 453* 421* 462*   < > = values in this interval not displayed.  Basic Metabolic Panel: Recent Labs  Lab 10/11/19 1024 10/11/19 1024 10/12/19 0515 10/13/19 0628 10/14/19 1331 10/16/19 0517 10/17/19 0515  NA 138   < > 135 136 137 133* 136  K 4.2   < > 3.9 4.3 4.1 3.0* 4.0  CL 100   < > 100 99 100 96* 99  CO2 25   < > 22 25 24 25 25   GLUCOSE 222*   < > 166* 136* 219* 130* 161*  BUN 15   < > 16 17 18 16 12   CREATININE 0.94   < > 0.81 0.91 0.93 0.74 0.80  CALCIUM 9.3   < > 9.2 9.2 9.5 8.8* 9.0  MG 2.6*  --  2.5* 2.5*  --   --  2.6*  PHOS 2.9  --  3.7 2.9  --   --  4.3   < > = values in this interval not displayed.    GFR: Estimated Creatinine Clearance: 151.4 mL/min (by C-G formula based on SCr of 0.8 mg/dL). Recent Labs  Lab 10/13/19 0628 10/16/19 0517 10/16/19 1844 10/17/19 0515  WBC 17.5* 17.0* 14.5* 14.6*    Liver Function Tests: Recent Labs  Lab 10/11/19 1024 10/12/19 0515 10/13/19 0628 10/17/19 0515  AST 18 18 18 15   ALT 23 23 19 22   ALKPHOS 44 45 45 52  BILITOT 1.0 1.3* 1.8* 1.5*  PROT 7.6 7.5 7.5 6.9  ALBUMIN 4.0 3.5 3.9 3.6   No results for input(s): LIPASE, AMYLASE in the last 168 hours. No results for input(s): AMMONIA in the last 168 hours.  ABG No results found for: PHART, PCO2ART, PO2ART, HCO3, TCO2, ACIDBASEDEF, O2SAT   Coagulation Profile: No results for input(s): INR, PROTIME in the last 168 hours.  Cardiac Enzymes: No results for input(s): CKTOTAL, CKMB, CKMBINDEX, TROPONINI in the last 168 hours.  HbA1C: Hgb A1c MFr Bld  Date/Time Value Ref Range Status  10/08/2019 08:52 AM 6.6 (H) 4.8 - 5.6 % Final    Comment:    (NOTE) Pre diabetes:          5.7%-6.4%  Diabetes:              >6.4%  Glycemic control for   <7.0% adults with diabetes     CBG: Recent Labs  Lab 10/14/19 1611 10/14/19 2140 10/15/19 0733 10/15/19 1130 10/17/19 1137  GLUCAP 188* 185* 131* 187* 139*    Review of Systems:   + SOB, cough + swelling face, chest + Fatigue, weakness + chest tightness   Past Medical History  She,  has a past medical history of Asthma and Hypothyroidism.   Surgical History   History reviewed. No pertinent surgical history.   Social History   reports that she has never smoked. She has never used smokeless tobacco. She reports current alcohol use. She reports that she does not use drugs.   Family History   Her family history includes Hypertension in her father and mother.   Allergies No Known Allergies   Home Medications  Prior to Admission medications   Medication Sig Start Date End Date Taking? Authorizing Provider  acetaminophen  (TYLENOL) 500 MG tablet Take 500-1,000 mg by mouth every 6 (six) hours as needed for mild pain, fever or headache.    [provider]  albuterol (VENTOLIN HFA) 108 (90 Base) MCG/ACT inhaler Inhale 1-2 puffs into the lungs every 6 (six) hours as needed for wheezing or shortness of breath. 09/30/19   Henderly, Britni A, PA-C  benzonatate (TESSALON)  100 MG capsule Take 1 capsule (100 mg total) by mouth every 8 (eight) hours. 09/30/19   Henderly, Britni A, PA-C  cetirizine (ZYRTEC ALLERGY) 10 MG tablet Take 10 mg by mouth daily as needed for allergies or rhinitis.    [provider]  chlorpheniramine-HYDROcodone (TUSSIONEX) 10-8 MG/5ML SUER Take 5 mLs by mouth every 12 (twelve) hours as needed for cough. 10/15/19   Bonnielee Haff, MD  fluticasone [facility]) 50 MCG/ACT nasal spray Place 2 sprays into both nostrils daily. 09/30/19   Henderly, Britni A, PA-C  furosemide (LASIX) 20 MG tablet Take 1 tablet (20 mg total) by mouth daily for 7 days. 10/15/19 10/22/19  Bonnielee Haff, MD  ibuprofen (ADVIL) 200 MG tablet Take 200-400 mg by mouth every 6 (six) hours as needed for fever, headache or mild pain.    [provider]  levothyroxine (SYNTHROID) 200 MCG tablet Take 200 mcg by mouth daily before breakfast.    [provider]  ondansetron (ZOFRAN ODT) 4 MG disintegrating tablet Take 1 tablet (4 mg total) by mouth every 8 (eight) hours as needed for nausea or vomiting. Patient taking differently: Take 4 mg by mouth every 8 (eight) hours as needed for nausea or vomiting (DISSOLVE ORALLY).  09/30/19   Henderly, Britni A, PA-C  oxyCODONE-acetaminophen (PERCOCET/ROXICET) 5-325 MG tablet Take 1 tablet by mouth every 6 (six) hours as needed for severe pain. 10/02/19   Breck Coons, MD  pantoprazole (PROTONIX) 40 MG tablet Take 1 tablet (40 mg total) by mouth 2 (two) times daily for 14 days. 10/15/19 10/29/19  Bonnielee Haff, MD  polyethylene glycol (MIRALAX / GLYCOLAX) 17 g packet Take 17 g by  mouth daily as needed. 10/15/19   Bonnielee Haff, MD  predniSONE (DELTASONE) 20 MG tablet Take 3 tablets once daily for 3 days followed by 2 tablets once daily for 3 days followed by 1 tablet once daily for 3 days and then stop 10/15/19   Bonnielee Haff, MD      Jacky Kindle MD Critical care physician [place]  Pager: [phone] Mobile: [phone]

## 2019-10-18 ENCOUNTER — Inpatient Hospital Stay (HOSPITAL_COMMUNITY): Payer: BC Managed Care – PPO

## 2019-10-18 ENCOUNTER — Encounter (HOSPITAL_COMMUNITY): Payer: Self-pay | Admitting: Internal Medicine

## 2019-10-18 DIAGNOSIS — J1282 Pneumonia due to coronavirus disease 2019: Secondary | ICD-10-CM

## 2019-10-18 LAB — COMPREHENSIVE METABOLIC PANEL
ALT: 32 U/L (ref 0–44)
AST: 18 U/L (ref 15–41)
Albumin: 3.4 g/dL — ABNORMAL LOW (ref 3.5–5.0)
Alkaline Phosphatase: 57 U/L (ref 38–126)
Anion gap: 13 (ref 5–15)
BUN: 15 mg/dL (ref 6–20)
CO2: 24 mmol/L (ref 22–32)
Calcium: 9 mg/dL (ref 8.9–10.3)
Chloride: 101 mmol/L (ref 98–111)
Creatinine, Ser: 0.97 mg/dL (ref 0.44–1.00)
GFR calc Af Amer: >60 mL/min (ref >60)
GFR calc non Af Amer: >60 mL/min (ref >60)
Glucose, Bld: 118 mg/dL — ABNORMAL HIGH (ref 70–99)
Potassium: 3.4 mmol/L — ABNORMAL LOW (ref 3.5–5.1)
Sodium: 138 mmol/L (ref 135–145)
Total Bilirubin: 1.1 mg/dL (ref 0.3–1.2)
Total Protein: 6.6 g/dL (ref 6.5–8.1)

## 2019-10-18 LAB — CBC WITH DIFFERENTIAL/PLATELET
Abs Immature Granulocytes: 0.13 10*3/uL — ABNORMAL HIGH (ref 0.00–0.07)
Basophils Absolute: 0 10*3/uL (ref 0.0–0.1)
Basophils Relative: 0 %
Eosinophils Absolute: 0.1 10*3/uL (ref 0.0–0.5)
Eosinophils Relative: 1 %
HCT: 42.8 % (ref 36.0–46.0)
Hemoglobin: 14.4 g/dL (ref 12.0–15.0)
Immature Granulocytes: 1 %
Lymphocytes Relative: 14 %
Lymphs Abs: 1.9 10*3/uL (ref 0.7–4.0)
MCH: 28.9 pg (ref 26.0–34.0)
MCHC: 33.6 g/dL (ref 30.0–36.0)
MCV: 85.9 fL (ref 80.0–100.0)
Monocytes Absolute: 1.3 10*3/uL — ABNORMAL HIGH (ref 0.1–1.0)
Monocytes Relative: 10 %
Neutro Abs: 10 10*3/uL — ABNORMAL HIGH (ref 1.7–7.7)
Neutrophils Relative %: 74 %
Platelets: 374 10*3/uL (ref 150–400)
RBC: 4.98 MIL/uL (ref 3.87–5.11)
RDW: 14.6 % (ref 11.5–15.5)
WBC: 13.5 10*3/uL — ABNORMAL HIGH (ref 4.0–10.5)
nRBC: 0 % (ref 0.0–0.2)

## 2019-10-18 LAB — GLUCOSE, CAPILLARY
Glucose-Capillary: 124 mg/dL — ABNORMAL HIGH (ref 70–99)
Glucose-Capillary: 241 mg/dL — ABNORMAL HIGH (ref 70–99)
Glucose-Capillary: 158 mg/dL — ABNORMAL HIGH (ref 70–99)
Glucose-Capillary: 175 mg/dL — ABNORMAL HIGH (ref 70–99)

## 2019-10-18 LAB — MRSA PCR SCREENING: MRSA by PCR: NEGATIVE

## 2019-10-18 LAB — C-REACTIVE PROTEIN: CRP: 0.6 mg/dL (ref <1.0)

## 2019-10-18 LAB — PHOSPHORUS: Phosphorus: 3.4 mg/dL (ref 2.5–4.6)

## 2019-10-18 LAB — MAGNESIUM: Magnesium: 2.4 mg/dL (ref 1.7–2.4)

## 2019-10-18 NOTE — Progress Notes (Signed)
   10/18/19 1600  Assess: MEWS Score  Temp 98.1 F (36.7 C)  BP 120/78  Pulse Rate (!) 115  ECG Heart Rate (!) 115  Resp (!) 22  SpO2 100 %  O2 Device Nasal Cannula  O2 Flow Rate (L/min) 3 L/min  Assess: MEWS Score  MEWS Temp 0  MEWS Systolic 0  MEWS Pulse 2  MEWS RR 1  MEWS LOC 0  MEWS Score 3  MEWS Score Color Yellow  Assess: if the MEWS score is Yellow or Red  Were vital signs taken at a resting state? Yes  Focused Assessment No change from prior assessment  Early Detection of Sepsis Score *See Row Information* Low  MEWS guidelines implemented *See Row Information* Yes  Take Vital Signs  Increase Vital Sign Frequency  Yellow: Q 2hr X 2 then Q 4hr X 2, if remains yellow, continue Q 4hrs  Escalate  MEWS: Escalate Yellow: discuss with charge nurse/RN and consider discussing with provider and RRT  Notify: Charge Nurse/RN  Name of Charge Nurse/RN Notified Primitivo Gauze RN  Date Charge Nurse/RN Notified 10/18/19  Time Charge Nurse/RN Notified [id number]  Notify: Provider  Provider Name/Title Dr. Reesa Chew  Date Provider Notified 10/18/19  Time Provider Notified 1605  Notification Type Page  Notification Reason Other (Comment) (per protocol)  Response No new orders  Date of Provider Response 10/18/19  Time of Provider Response 1607  Dr. Reesa Chew notified. Stated he would like to keep pts HR slightly elevated

## 2019-10-18 NOTE — Progress Notes (Signed)
PROGRESS NOTE    Sarah Calhoun  BCW:[id number] DOB: 12-23-1995 DOA: 10/16/2019 PCP: Loraine Leriche., MD   Brief Narrative:  24 year old with history of hypothyroidism, morbid obesity admitted for hypoxic respiratory failure secondary to COVID-19 pneumonia. She was admitted here 2 weeks ago (8/23-9/3) with COVID-19 pneumonia and pneumomediastinum, discharged a day prior to admission but developed sudden shortness of breath therefore came back to the hospital. CT chest and neck showed worsening large pneumomediastinum with emphysema, large left pneumothorax and widespread multifocal pneumonia. Pulmonary was consulted, chest tube placed on 9/4.   Assessment & Plan:   Principal Problem:   Pneumomediastinum ([place]) Active Problems:   Obesity, Class III, BMI 40-49.9 (morbid obesity) (HCC)   Acute hypoxemic respiratory failure due to COVID-19 [facility])   Hypothyroidism   Leukocytosis   Thrombocytosis (HCC)   Acute respiratory distress secondary to large pneumomediastinum with emphysema and large left-sided pneumothorax COVID-19 pneumonia with bilateral infiltrates-multifocal -Oxygen requirement  >10 L high flow nasal cannula -COVID-19 positive test-09/30/2019 -Completed course of remdesivir. Currently on daily prednisone -Worsening of her breathing overnight therefore chest tube placed to suction.  We will repeat chest x-ray today and again tomorrow morning. -Pulmonary team following for chest tube management -Out of bed to chair, incentive spirometer, flutter valve. Bronchodilators as needed. -Supplemental oxygen.  Hypothyroidism -Daily Synthroid  Diabetes mellitus type 2 -Recent hemoglobin A1c 6.6. Sliding scale Accu-Chek while in the hospital.  GERD -PPI  Constipation -Bowel regimen  Morbid obesity with BMI greater than 45 -Weight loss diet and exercise    DVT prophylaxis: Lovenox Code Status: Full code Family Communication: Mother updated.  Status is:  Inpatient  Remains inpatient appropriate because:Inpatient level of care appropriate due to severity of illness   Dispo: The patient is from: Home              Anticipated d/c is to: Home              Anticipated d/c date is: 2 days              Patient currently is not medically stable to d/c. Still on 10L HF [place] oxygen.    Body mass index is 43.91 kg/m.    Consultants:   Pulmonary   Subjective: Patient is sitting up in the chair now on 15 L high flow nasal cannula.  Overnight her requirements increased therefore her chest tube was placed back to suction and supplemental oxygen was started.  Review of Systems Otherwise negative except as per HPI, including: General = no fevers, chills, dizziness,  fatigue HEENT/EYES = negative for loss of vision, double vision, blurred vision,  sore throa Cardiovascular= negative for chest pain, palpitation Respiratory/lungs= negative  hemoptysis,  Gastrointestinal= negative for nausea, vomiting, abdominal pain Genitourinary= negative for Dysuria MSK = Negative for arthralgia, myalgias Neurology= Negative for headache, numbness, tingling  Psychiatry= Negative for suicidal and homocidal ideation Skin= Negative for Rash   Examination: Constitutional: Not in acute distress, morbid obesity on 10-15 L high flow nasal cannula Respiratory: Bilateral diminished breath sounds Cardiovascular: Normal sinus rhythm, no rubs Abdomen: Nontender nondistended good bowel sounds Musculoskeletal: No edema noted Skin: No rashes seen Neurologic: CN 2-12 grossly intact.  And nonfocal Psychiatric: Normal judgment and insight. Alert and oriented x 3. Normal mood.     Chest tube in place  Objective: Vitals:   10/18/19 0450 10/18/19 0813 10/18/19 0821 10/18/19 1144  BP: 130/86 (!) 138/99 (!) 153/71 (!) 135/93  Pulse: 88 97 67 (!) 107  Resp: 20 (!) 21 20 20   Temp: 98.4 F (36.9 C) 97.7 F (36.5 C) 99 F (37.2 C) 98.1 F (36.7 C)  TempSrc: Oral Oral  Oral Oral  SpO2: 96% 93% 92% 94%  Weight:      Height:        Intake/Output Summary (Last 24 hours) at 10/18/2019 1159 Last data filed at 10/18/2019 0900 Gross per 24 hour  Intake 240 ml  Output 500 ml  Net -260 ml   Filed Weights   10/16/19 0321 10/18/19 0311  Weight: 133.8 kg 119.7 kg     Data Reviewed:   CBC: Recent Labs  Lab 10/12/19 0515 10/12/19 0515 10/13/19 0628 10/16/19 0517 10/16/19 1844 10/17/19 0515 10/18/19 0846  WBC 16.7*   < > 17.5* 17.0* 14.5* 14.6* 13.5*  NEUTROABS 14.4*  --  14.7* 13.3*  --  13.2* 10.0*  HGB 15.7*   < > 15.6* 14.8 15.6* 14.0 14.4  HCT 46.7*   < > 46.1* 43.1 46.1* 42.2 42.8  MCV 84.9   < > 84.6 82.6 85.1 84.6 85.9  PLT 546*   < > 485* 453* 421* 462* 374   < > = values in this interval not displayed.   Basic Metabolic Panel: Recent Labs  Lab 10/12/19 0515 10/12/19 0515 10/13/19 9211 10/14/19 1331 10/16/19 0517 10/17/19 0515 10/18/19 0846  NA 135   < > 136 137 133* 136 138  K 3.9   < > 4.3 4.1 3.0* 4.0 3.4*  CL 100   < > 99 100 96* 99 101  CO2 22   < > 25 24 25 25 24   GLUCOSE 166*   < > 136* 219* 130* 161* 118*  BUN 16   < > 17 18 16 12 15   CREATININE 0.81   < > 0.91 0.93 0.74 0.80 0.97  CALCIUM 9.2   < > 9.2 9.5 8.8* 9.0 9.0  MG 2.5*  --  2.5*  --   --  2.6* 2.4  PHOS 3.7  --  2.9  --   --  4.3 3.4   < > = values in this interval not displayed.   GFR: Estimated Creatinine Clearance: 116.9 mL/min (by C-G formula based on SCr of 0.97 mg/dL). Liver Function Tests: Recent Labs  Lab 10/12/19 0515 10/13/19 0628 10/17/19 0515 10/18/19 0846  AST 18 18 15 18   ALT 23 19 22  32  ALKPHOS 45 45 52 57  BILITOT 1.3* 1.8* 1.5* 1.1  PROT 7.5 7.5 6.9 6.6  ALBUMIN 3.5 3.9 3.6 3.4*   No results for input(s): LIPASE, AMYLASE in the last 168 hours. No results for input(s): AMMONIA in the last 168 hours. Coagulation Profile: No results for input(s): INR, PROTIME in the last 168 hours. Cardiac Enzymes: No results for input(s):  CKTOTAL, CKMB, CKMBINDEX, TROPONINI in the last 168 hours. BNP (last 3 results) No results for input(s): PROBNP in the last 8760 hours. HbA1C: No results for input(s): HGBA1C in the last 72 hours. CBG: Recent Labs  Lab 10/17/19 1137 10/17/19 1709 10/17/19 2114 10/18/19 0811 10/18/19 1142  GLUCAP 139* 174* 192* 124* 241*   Lipid Profile: No results for input(s): CHOL, HDL, LDLCALC, TRIG, CHOLHDL, LDLDIRECT in the last 72 hours. Thyroid Function Tests: No results for input(s): TSH, T4TOTAL, FREET4, T3FREE, THYROIDAB in the last 72 hours. Anemia Panel: No results for input(s): VITAMINB12, FOLATE, FERRITIN, TIBC, IRON, RETICCTPCT in the last 72 hours. Sepsis Labs: No results for input(s): PROCALCITON, LATICACIDVEN in the last  168 hours.  Recent Results (from the past 240 hour(s))  MRSA PCR Screening     Status: None   Collection Time: 10/18/19  3:00 AM   Specimen: Nasal Mucosa; Nasopharyngeal  Result Value Ref Range Status   MRSA by PCR NEGATIVE NEGATIVE Final    Comment:        The GeneXpert MRSA Assay (FDA approved for NASAL specimens only), is one component of a comprehensive MRSA colonization surveillance program. It is not intended to diagnose MRSA infection nor to guide or monitor treatment for MRSA infections. Performed at [facility], [place] [address], [place], [place] [address]          Radiology Studies: DG Chest Portable 1 View  Result Date: 10/17/2019 CLINICAL DATA:  Increased chest tightness and tenderness associated with chest tube. COVID positive. EXAM: PORTABLE CHEST 1 VIEW COMPARISON:  Radiograph earlier today.  Chest CT yesterday FINDINGS: Left-sided chest tube in place. No visualized pneumothorax. Pneumomediastinum which was better assessed on prior CT. Low lung volumes. Patchy heterogeneous bilateral airspace opacities in a mid-lower lung zone predominant distribution, unchanged. Stable heart size and mediastinal contours. IMPRESSION: 1. Left  chest tube in place. No visualized pneumothorax. 2. Stable low lung volumes in bilateral COVID pneumonia. 3. Pneumomediastinum which was better assessed on CT yesterday. Electronically Signed   By: Keith Rake M.D.   On: 10/17/2019 23:30   DG Chest Portable 1 View  Result Date: 10/17/2019 CLINICAL DATA:  Left pneumothorax EXAM: PORTABLE CHEST 1 VIEW COMPARISON:  6:44 a.m. FINDINGS: Lung volumes are small and pulmonary insufflation has decreased since prior examination. Left chest tube is unchanged. No pneumothorax or pleural effusion. Multifocal pulmonary infiltrates are again identified, progressive at the right lung base and within the left upper lobe. Cardiac size within normal limits. No acute bone abnormality. IMPRESSION: 1. Diminished lung volumes with progressive multifocal pulmonary infiltrates. 2. Stable left chest tube. No pneumothorax. Electronically Signed   By: Fidela Salisbury MD   On: 10/17/2019 20:13   DG CHEST PORT 1 VIEW  Result Date: 10/17/2019 CLINICAL DATA:  COVID-19 positive. EXAM: PORTABLE CHEST 1 VIEW COMPARISON:  October 16, 2019. FINDINGS: Stable cardiomediastinal silhouette. Stable patchy airspace opacities are noted bilaterally consistent with multifocal pneumonia. Stable position of left-sided chest tube. No definite pneumothorax or pleural effusion is noted. Bony thorax is unremarkable. IMPRESSION: Stable bilateral multifocal pneumonia. Stable position of left-sided chest tube. No definite pneumothorax is noted. Electronically Signed   By: Marijo Conception M.D.   On: 10/17/2019 08:22   DG CHEST PORT 1 VIEW  Result Date: 10/16/2019 CLINICAL DATA:  Left-sided chest tube placement. EXAM: PORTABLE CHEST 1 VIEW COMPARISON:  Earlier the same day. FINDINGS: There has been interval placement of left-sided chest tube. Radiographic resolution of the previously demonstrated left-sided pneumothorax. The pneumomediastinum demonstrated by chest CT earlier today is barely visualized  radiographically. Soft tissue emphysema within the neck is seen. Cardiomediastinal silhouette is normal. Mediastinal contours appear intact. Low lung volumes with worsening bilateral patchy areas of airspace consolidation with lower lobe predominance. Osseous structures are without acute abnormality. Soft tissues are grossly normal. IMPRESSION: 1. Interval placement of left-sided chest tube with radiographic resolution of the previously demonstrated left-sided pneumothorax. 2. Low lung volumes with worsening bilateral patchy areas of airspace consolidation with lower lobe predominance. 3. Soft tissue emphysema within the neck. Poor visualization of the known pneumomediastinum demonstrated by chest CT earlier today. Electronically Signed   By: Linwood Dibbles.D.  On: 10/16/2019 17:51   DG CHEST PORT 1 VIEW  Result Date: 10/16/2019 CLINICAL DATA:  Shortness of breath. EXAM: PORTABLE CHEST 1 VIEW COMPARISON:  October 15, 2019 FINDINGS: Cardiomediastinal silhouette is normal. Mediastinal contours appear intact. Persistent patchy bilateral with lower lobe predominance airspace consolidation. The pneumomediastinum demonstrated by CT earlier today is not well seen radiographically. There is moderate left pneumothorax. Osseous structures are without acute abnormality. Soft tissues are grossly normal. IMPRESSION: 1. Moderate left pneumothorax. 2. Persistent patchy bilateral with lower lobe predominance airspace consolidation consistent with COVID-19 pneumonia. 3. The pneumomediastinum demonstrated by chest CT earlier today is not well seen radiographically. Electronically Signed   By: Fidela Salisbury M.D.   On: 10/16/2019 16:17        Scheduled Meds: . benzonatate  100 mg Oral Q8H  . enoxaparin (LOVENOX) injection  60 mg Subcutaneous Q24H  . insulin aspart  0-5 Units Subcutaneous QHS  . insulin aspart  0-6 Units Subcutaneous TID WC  . Ipratropium-Albuterol  1 puff Inhalation Q6H  . levothyroxine   200 mcg Oral QAC breakfast  . lidocaine (PF)  15 mL Intradermal Once  . pantoprazole  40 mg Oral BID  . predniSONE  60 mg Oral Q breakfast  . sodium chloride flush  10 mL Intracatheter Q8H   Continuous Infusions:   LOS: 2 days   Time spent=35 mins    Kaiyla Stahly Arsenio Loader, MD [facility]  If 7PM-7AM, please contact night-coverage  10/18/2019, 11:59 AM

## 2019-10-18 NOTE — Progress Notes (Signed)
NAMEClarice Zulauf, MRN:  [id number], DOB:  07-Nov-1995, LOS: 2 ADMISSION DATE:  10/16/2019, CONSULTATION DATE:  9/4 REFERRING MD:  Doristine Bosworth - TRH  CHIEF COMPLAINT:  Air in face and neck   Brief History   24 y o F recently discharged 9/3 after covid admission, who presented to [facility] 9/4 with "air in face and neck" CT reveals worse pneumomediastinum and now with new L ptx ED to Ed transfer PCCM consulted and chest tube was placed  Past Medical History  COVID-19 PNA Obesity Hypothyroidism [facility] Events   9/4 ED to ED transfer from [facility] for pneumomediastinum. Admitted to [facility]. After admit, while in ED, PCCm consulted for pneumomediastinum. PCCM noted L ptx on CT chest, placed L pigtail.   Consults:  PCCM   Procedures:  9/4 L pigtail >   Significant Diagnostic Tests:  9/4 CT chest> L PTX, large pneumomediastinum. Emphysema extending throughout neck. Multifocal PNA   Micro Data:    Antimicrobials:    Interim history/subjective:  Patient started complaining of pain around chest tube last night, associated with some shortness of breath, immediately suction on chest tube was restarted, even before getting x-ray chest.  Repeat x-rays just showed no pneumothorax  Objective   Blood pressure (!) 135/93, pulse (!) 107, temperature 98.1 F (36.7 C), temperature source Oral, resp. rate 20, height 5\' 5"  (1.651 m), weight 119.7 kg, SpO2 94 %.    FiO2 (%):  [60 %-100 %] 60 %   Intake/Output Summary (Last 24 hours) at 10/18/2019 1322 Last data filed at 10/18/2019 0900 Gross per 24 hour  Intake 240 ml  Output 500 ml  Net -260 ml   Filed Weights   10/16/19 0321 10/18/19 0311  Weight: 133.8 kg 119.7 kg    Examination: General: Obese young adult F, sitting in the chair HENT: . No crepitus Trachea midline Lungs: Coarse breath sounds bilaterally, left-sided anterior chest tube in place Cardiovascular: Regular rate and rhythm, no murmur Abdomen: obese soft round ndnt   Extremities: Symmetrical bulk and tone no obvious joint deformity  Neuro: AAOx4 following commands PERRLA Skin: No rash  Resolved Hospital Problem list     Assessment & Plan:   Acute hypoxic respiratory failure in setting of COVID 19 PNA L sided pneumothorax s/p left anterior 14 French chest tube placement Pneumomediastinum, improved Morbid obesity  Left anterior chest tube is in place, suction on chest tube was restarted last night before even getting x-ray chest to confirm development of pneumothorax This morning x-ray chest was repeated showing no pneumothorax I turned off suction on chest tube again, informed patient's RN that we will get x-ray chest at 6 PM and repeat early morning tomorrow if both remained stable without development of pneumothorax, will discontinue chest tube Rest of the management per primary team   PCCM will follow for chest tube  Best practice:  Diet: PO  Pain/Anxiety/Delirium protocol (if indicated): APAP  VAP protocol (if indicated): na DVT prophylaxis: lovenox  GI prophylaxis: n/a Glucose control: monitor Mobility: PT OT  Code Status: Full  Family Communication: pt updated Disposition: Per primary team  Labs   CBC: Recent Labs  Lab 10/12/19 0515 10/12/19 0515 10/13/19 7673 10/16/19 0517 10/16/19 1844 10/17/19 0515 10/18/19 0846  WBC 16.7*   < > 17.5* 17.0* 14.5* 14.6* 13.5*  NEUTROABS 14.4*  --  14.7* 13.3*  --  13.2* 10.0*  HGB 15.7*   < > 15.6* 14.8 15.6* 14.0 14.4  HCT 46.7*   < >  46.1* 43.1 46.1* 42.2 42.8  MCV 84.9   < > 84.6 82.6 85.1 84.6 85.9  PLT 546*   < > 485* 453* 421* 462* 374   < > = values in this interval not displayed.    Basic Metabolic Panel: Recent Labs  Lab 10/12/19 0515 10/12/19 0515 10/13/19 6546 10/14/19 1331 10/16/19 0517 10/17/19 0515 10/18/19 0846  NA 135   < > 136 137 133* 136 138  K 3.9   < > 4.3 4.1 3.0* 4.0 3.4*  CL 100   < > 99 100 96* 99 101  CO2 22   < > 25 24 25 25 24   GLUCOSE 166*    < > 136* 219* 130* 161* 118*  BUN 16   < > 17 18 16 12 15   CREATININE 0.81   < > 0.91 0.93 0.74 0.80 0.97  CALCIUM 9.2   < > 9.2 9.5 8.8* 9.0 9.0  MG 2.5*  --  2.5*  --   --  2.6* 2.4  PHOS 3.7  --  2.9  --   --  4.3 3.4   < > = values in this interval not displayed.   GFR: Estimated Creatinine Clearance: 116.9 mL/min (by C-G formula based on SCr of 0.97 mg/dL). Recent Labs  Lab 10/16/19 0517 10/16/19 1844 10/17/19 0515 10/18/19 0846  WBC 17.0* 14.5* 14.6* 13.5*    Liver Function Tests: Recent Labs  Lab 10/12/19 0515 10/13/19 0628 10/17/19 0515 10/18/19 0846  AST 18 18 15 18   ALT 23 19 22  32  ALKPHOS 45 45 52 57  BILITOT 1.3* 1.8* 1.5* 1.1  PROT 7.5 7.5 6.9 6.6  ALBUMIN 3.5 3.9 3.6 3.4*   No results for input(s): LIPASE, AMYLASE in the last 168 hours. No results for input(s): AMMONIA in the last 168 hours.  ABG No results found for: PHART, PCO2ART, PO2ART, HCO3, TCO2, ACIDBASEDEF, O2SAT   Coagulation Profile: No results for input(s): INR, PROTIME in the last 168 hours.  Cardiac Enzymes: No results for input(s): CKTOTAL, CKMB, CKMBINDEX, TROPONINI in the last 168 hours.  HbA1C: Hgb A1c MFr Bld  Date/Time Value Ref Range Status  10/08/2019 08:52 AM 6.6 (H) 4.8 - 5.6 % Final    Comment:    (NOTE) Pre diabetes:          5.7%-6.4%  Diabetes:              >6.4%  Glycemic control for   <7.0% adults with diabetes     CBG: Recent Labs  Lab 10/17/19 1137 10/17/19 1709 10/17/19 2114 10/18/19 0811 10/18/19 1142  GLUCAP 139* 174* 192* 124* 241*    Review of Systems:   + SOB, cough + swelling face, chest + Fatigue, weakness + chest tightness   Past Medical History  She,  has a past medical history of Asthma and Hypothyroidism.   Surgical History   History reviewed. No pertinent surgical history.   Social History   reports that she has never smoked. She has never used smokeless tobacco. She reports current alcohol use. She reports that she does not  use drugs.   Family History   Her family history includes Hypertension in her father and mother.   Allergies No Known Allergies   Home Medications  Prior to Admission medications   Medication Sig Start Date End Date Taking? Authorizing Provider  acetaminophen (TYLENOL) 500 MG tablet Take 500-1,000 mg by mouth every 6 (six) hours as needed for mild pain, fever or headache.  [provider]  albuterol (VENTOLIN HFA) 108 (90 Base) MCG/ACT inhaler Inhale 1-2 puffs into the lungs every 6 (six) hours as needed for wheezing or shortness of breath. 09/30/19   Henderly, Britni A, PA-C  benzonatate (TESSALON) 100 MG capsule Take 1 capsule (100 mg total) by mouth every 8 (eight) hours. 09/30/19   Henderly, Britni A, PA-C  cetirizine (ZYRTEC ALLERGY) 10 MG tablet Take 10 mg by mouth daily as needed for allergies or rhinitis.    [provider]  chlorpheniramine-HYDROcodone (TUSSIONEX) 10-8 MG/5ML SUER Take 5 mLs by mouth every 12 (twelve) hours as needed for cough. 10/15/19   Bonnielee Haff, MD  fluticasone [facility]) 50 MCG/ACT nasal spray Place 2 sprays into both nostrils daily. 09/30/19   Henderly, Britni A, PA-C  furosemide (LASIX) 20 MG tablet Take 1 tablet (20 mg total) by mouth daily for 7 days. 10/15/19 10/22/19  Bonnielee Haff, MD  ibuprofen (ADVIL) 200 MG tablet Take 200-400 mg by mouth every 6 (six) hours as needed for fever, headache or mild pain.    [provider]  levothyroxine (SYNTHROID) 200 MCG tablet Take 200 mcg by mouth daily before breakfast.    [provider]  ondansetron (ZOFRAN ODT) 4 MG disintegrating tablet Take 1 tablet (4 mg total) by mouth every 8 (eight) hours as needed for nausea or vomiting. Patient taking differently: Take 4 mg by mouth every 8 (eight) hours as needed for nausea or vomiting (DISSOLVE ORALLY).  09/30/19   Henderly, Britni A, PA-C  oxyCODONE-acetaminophen (PERCOCET/ROXICET) 5-325 MG tablet Take 1 tablet by mouth every 6 (six)  hours as needed for severe pain. 10/02/19   Breck Coons, MD  pantoprazole (PROTONIX) 40 MG tablet Take 1 tablet (40 mg total) by mouth 2 (two) times daily for 14 days. 10/15/19 10/29/19  Bonnielee Haff, MD  polyethylene glycol (MIRALAX / GLYCOLAX) 17 g packet Take 17 g by mouth daily as needed. 10/15/19   Bonnielee Haff, MD  predniSONE (DELTASONE) 20 MG tablet Take 3 tablets once daily for 3 days followed by 2 tablets once daily for 3 days followed by 1 tablet once daily for 3 days and then stop 10/15/19   Bonnielee Haff, MD      Jacky Kindle MD Critical care physician [place]  Pager: [phone] Mobile: [phone]

## 2019-10-19 ENCOUNTER — Inpatient Hospital Stay (HOSPITAL_COMMUNITY): Payer: BC Managed Care – PPO

## 2019-10-19 ENCOUNTER — Encounter (HOSPITAL_COMMUNITY): Payer: BC Managed Care – PPO

## 2019-10-19 LAB — CBC WITH DIFFERENTIAL/PLATELET
Abs Immature Granulocytes: 0.16 10*3/uL — ABNORMAL HIGH (ref 0.00–0.07)
Basophils Absolute: 0 10*3/uL (ref 0.0–0.1)
Basophils Relative: 0 %
Eosinophils Absolute: 0 10*3/uL (ref 0.0–0.5)
Eosinophils Relative: 0 %
HCT: 41.7 % (ref 36.0–46.0)
Hemoglobin: 14.1 g/dL (ref 12.0–15.0)
Immature Granulocytes: 1 %
Lymphocytes Relative: 15 %
Lymphs Abs: 2 10*3/uL (ref 0.7–4.0)
MCH: 29 pg (ref 26.0–34.0)
MCHC: 33.8 g/dL (ref 30.0–36.0)
MCV: 85.6 fL (ref 80.0–100.0)
Monocytes Absolute: 1.3 10*3/uL — ABNORMAL HIGH (ref 0.1–1.0)
Monocytes Relative: 10 %
Neutro Abs: 9.7 10*3/uL — ABNORMAL HIGH (ref 1.7–7.7)
Neutrophils Relative %: 74 %
Platelets: 381 10*3/uL (ref 150–400)
RBC: 4.87 MIL/uL (ref 3.87–5.11)
RDW: 14.5 % (ref 11.5–15.5)
WBC: 13.2 10*3/uL — ABNORMAL HIGH (ref 4.0–10.5)
nRBC: 0 % (ref 0.0–0.2)

## 2019-10-19 LAB — COMPREHENSIVE METABOLIC PANEL
ALT: 30 U/L (ref 0–44)
AST: 13 U/L — ABNORMAL LOW (ref 15–41)
Albumin: 3.6 g/dL (ref 3.5–5.0)
Alkaline Phosphatase: 54 U/L (ref 38–126)
Anion gap: 12 (ref 5–15)
BUN: 11 mg/dL (ref 6–20)
CO2: 26 mmol/L (ref 22–32)
Calcium: 9 mg/dL (ref 8.9–10.3)
Chloride: 98 mmol/L (ref 98–111)
Creatinine, Ser: 0.89 mg/dL (ref 0.44–1.00)
GFR calc Af Amer: >60 mL/min (ref >60)
GFR calc non Af Amer: >60 mL/min (ref >60)
Glucose, Bld: 116 mg/dL — ABNORMAL HIGH (ref 70–99)
Potassium: 3.2 mmol/L — ABNORMAL LOW (ref 3.5–5.1)
Sodium: 136 mmol/L (ref 135–145)
Total Bilirubin: 1.4 mg/dL — ABNORMAL HIGH (ref 0.3–1.2)
Total Protein: 6.6 g/dL (ref 6.5–8.1)

## 2019-10-19 LAB — C-REACTIVE PROTEIN: CRP: <0.5 mg/dL (ref <1.0)

## 2019-10-19 LAB — PHOSPHORUS: Phosphorus: 4.4 mg/dL (ref 2.5–4.6)

## 2019-10-19 LAB — MAGNESIUM: Magnesium: 2.4 mg/dL (ref 1.7–2.4)

## 2019-10-19 LAB — GLUCOSE, CAPILLARY
Glucose-Capillary: 107 mg/dL — ABNORMAL HIGH (ref 70–99)
Glucose-Capillary: 200 mg/dL — ABNORMAL HIGH (ref 70–99)
Glucose-Capillary: 173 mg/dL — ABNORMAL HIGH (ref 70–99)
Glucose-Capillary: 186 mg/dL — ABNORMAL HIGH (ref 70–99)

## 2019-10-19 LAB — BRAIN NATRIURETIC PEPTIDE: B Natriuretic Peptide: 32.8 pg/mL (ref 0.0–100.0)

## 2019-10-19 MED ORDER — SENNOSIDES-DOCUSATE SODIUM 8.6-50 MG PO TABS
2.0000 | ORAL_TABLET | Freq: Every evening | ORAL | Status: DC | PRN
  Administered 2019-10-19: 2 via ORAL
  Filled 2019-10-19: qty 2

## 2019-10-19 MED ORDER — POTASSIUM CHLORIDE CRYS ER 20 MEQ PO TBCR
40.0000 meq | EXTENDED_RELEASE_TABLET | Freq: Once | ORAL | Status: AC
  Administered 2019-10-19: 40 meq via ORAL
  Filled 2019-10-19: qty 2

## 2019-10-19 NOTE — Plan of Care (Signed)

## 2019-10-19 NOTE — Progress Notes (Signed)
Patient's vital signs MEWs turned to yellow for pulse rate. Discussed patient's condition with charge nurse Elmyra Ricks. Patient is alert and oriented. No acute change. Will continue monitor.

## 2019-10-19 NOTE — Progress Notes (Signed)
PROGRESS NOTE    Sarah Calhoun  EUM:[id number] DOB: Mar 05, 1995 DOA: 10/16/2019 PCP: Loraine Leriche., MD   Brief Narrative:   24 year old with history of hypothyroidism, morbid obesity admitted for hypoxic respiratory failure secondary to COVID-19 pneumonia. Patient had previous admission 2 weeks ago (8/23-9/3) with COVID-19 pneumonia and pneumomediastinum.  A day after discharge the patient developed sudden shortness of breath therefore came back to the hospital. CT chest and neck showed worsening large pneumomediastinum with emphysema, large left pneumothorax and widespread multifocal pneumonia. Pulmonary consulted with a chest tube placed on 9/4.  Assessment & Plan:   Principal Problem:   Pneumomediastinum ([place]) Active Problems:   Obesity, Class III, BMI 40-49.9 (morbid obesity) (HCC)   Acute hypoxemic respiratory failure due to COVID-19 [facility])   Hypothyroidism   Leukocytosis   Thrombocytosis (HCC)  Acute respiratory distress secondary to large pneumomediastinum with emphysema and large left-sided pneumothorax COVID-19 pneumonia with bilateral infiltrates-multifocal -Oxygen requirement on admission > 10 L HFNC, overnight oxygen requirements decreased to 3L [place], will trial 1.5 L [place] -COVID-19 positive test-09/30/2019 -Completed course of remdesivir. Currently on daily prednisone -Pulmonary team following for chest tube management -Repeat chest X-ray (10/19/19) showed left chest tube in stable position with left apical pneumothorax again noted.  -Repeat chest X-ray for 10/20/19 to evaluate pneumothorax. -Out of bed to chair, incentive spirometer, flutter valve. Bronchodilators as needed. -Supplemental oxygen.  Hypothyroidism -Daily Synthroid  Diabetes mellitus type 2 -Recent hemoglobin A1c 6.6. Sliding scale Accu-Chek while in the hospital.  GERD -PPI  Constipation -Bowel regimen  Morbid obesity with BMI greater than 45 -Weight loss diet and exercise   DVT  prophylaxis: Lovenox Code Status: Full Code Family Communication:  Mother has been updated.  Status is: Inpatient  Dispo: The patient is from: Home              Anticipated d/c is to: Home              Anticipated d/c date is: 2 days              Patient currently is not medically stable to d/c. Still on 3L [place] with chest              tube in place.   Body mass index is 43.91 kg/m.    Consultants:   Pulmonary  Subjective:  Patient is lying in bed on 3 L [place] which is decreased from 15 HFNC the previous night.  Patient still has exertional shortness of breath and was educated on watching her oxygen saturation levels on the monitor and is encouraged to take deep breaths when the value is below 85 %.   Review of Systems Otherwise negative except as per HPI, including: General: Denies fever, chills, night sweats or unintended weight loss. Resp: Denies cough, wheezing, shortness of breath. Cardiac: Denies chest pain, palpitations, orthopnea, paroxysmal nocturnal dyspnea. GI: Denies abdominal pain, nausea, vomiting, diarrhea or constipation GU: Denies dysuria, frequency, hesitancy or incontinence MS: Denies muscle aches, joint pain or swelling Neuro: Denies headache, neurologic deficits (focal weakness, numbness, tingling), abnormal gait Psych: Denies anxiety, depression, SI/HI/AVH Skin: Denies new rashes or lesions ID: Denies sick contacts, exotic exposures, travel  Examination:  General exam: Appears calm and comfortable, morbid obesity on 3L [place]. Respiratory system: Bilateral diminished breath sounds. Cardiovascular system: S1 & S2 heard, RRR. No JVD, murmurs, rubs, gallops or clicks. No pedal edema. Gastrointestinal system: Abdomen is nondistended, soft and nontender. No organomegaly or masses felt. Normal bowel sounds  heard. Central nervous system: Alert and oriented. No focal neurological deficits. Extremities: Symmetric 5 x 5 power. Skin: No rashes, lesions or  ulcers Psychiatry: Judgement and insight appear normal. Mood & affect appropriate.   Chest tube in place.  Objective: Vitals:   10/19/19 0209 10/19/19 0316 10/19/19 0800 10/19/19 0850  BP: (!) 127/96 118/84  (!) 129/94  Pulse: 83 84  (!) 101  Resp: 16 17  20   Temp:  97.8 F (36.6 C)  98.1 F (36.7 C)  TempSrc:  Axillary  Oral  SpO2: 98% 98% 90% 92%  Weight:      Height:        Intake/Output Summary (Last 24 hours) at 10/19/2019 1149 Last data filed at 10/19/2019 1035 Gross per 24 hour  Intake 360 ml  Output 1402 ml  Net -1042 ml   Filed Weights   10/16/19 0321 10/18/19 0311  Weight: 133.8 kg 119.7 kg     Data Reviewed:   CBC: Recent Labs  Lab 10/13/19 0628 10/13/19 0628 10/16/19 0517 10/16/19 1844 10/17/19 0515 10/18/19 0846 10/19/19 0259  WBC 17.5*   < > 17.0* 14.5* 14.6* 13.5* 13.2*  NEUTROABS 14.7*  --  13.3*  --  13.2* 10.0* 9.7*  HGB 15.6*   < > 14.8 15.6* 14.0 14.4 14.1  HCT 46.1*   < > 43.1 46.1* 42.2 42.8 41.7  MCV 84.6   < > 82.6 85.1 84.6 85.9 85.6  PLT 485*   < > 453* 421* 462* 374 381   < > = values in this interval not displayed.   Basic Metabolic Panel: Recent Labs  Lab 10/13/19 0628 10/13/19 0628 10/14/19 1331 10/16/19 0517 10/17/19 0515 10/18/19 0846 10/19/19 0259  NA 136   < > 137 133* 136 138 136  K 4.3   < > 4.1 3.0* 4.0 3.4* 3.2*  CL 99   < > 100 96* 99 101 98  CO2 25   < > 24 25 25 24 26   GLUCOSE 136*   < > 219* 130* 161* 118* 116*  BUN 17   < > 18 16 12 15 11   CREATININE 0.91   < > 0.93 0.74 0.80 0.97 0.89  CALCIUM 9.2   < > 9.5 8.8* 9.0 9.0 9.0  MG 2.5*  --   --   --  2.6* 2.4 2.4  PHOS 2.9  --   --   --  4.3 3.4 4.4   < > = values in this interval not displayed.   GFR: Estimated Creatinine Clearance: 127.4 mL/min (by C-G formula based on SCr of 0.89 mg/dL). Liver Function Tests: Recent Labs  Lab 10/13/19 0628 10/17/19 0515 10/18/19 0846 10/19/19 0259  AST 18 15 18  13*  ALT 19 22 32 30  ALKPHOS 45 52 57 54   BILITOT 1.8* 1.5* 1.1 1.4*  PROT 7.5 6.9 6.6 6.6  ALBUMIN 3.9 3.6 3.4* 3.6   No results for input(s): LIPASE, AMYLASE in the last 168 hours. No results for input(s): AMMONIA in the last 168 hours. Coagulation Profile: No results for input(s): INR, PROTIME in the last 168 hours. Cardiac Enzymes: No results for input(s): CKTOTAL, CKMB, CKMBINDEX, TROPONINI in the last 168 hours. BNP (last 3 results) No results for input(s): PROBNP in the last 8760 hours. HbA1C: No results for input(s): HGBA1C in the last 72 hours. CBG: Recent Labs  Lab 10/18/19 1142 10/18/19 1628 10/18/19 2024 10/19/19 0840 10/19/19 1147  GLUCAP 241* 158* 175* 107* 200*   Lipid  Profile: No results for input(s): CHOL, HDL, LDLCALC, TRIG, CHOLHDL, LDLDIRECT in the last 72 hours. Thyroid Function Tests: No results for input(s): TSH, T4TOTAL, FREET4, T3FREE, THYROIDAB in the last 72 hours. Anemia Panel: No results for input(s): VITAMINB12, FOLATE, FERRITIN, TIBC, IRON, RETICCTPCT in the last 72 hours. Sepsis Labs: No results for input(s): PROCALCITON, LATICACIDVEN in the last 168 hours.  Recent Results (from the past 240 hour(s))  MRSA PCR Screening     Status: None   Collection Time: 10/18/19  3:00 AM   Specimen: Nasal Mucosa; Nasopharyngeal  Result Value Ref Range Status   MRSA by PCR NEGATIVE NEGATIVE Final    Comment:        The GeneXpert MRSA Assay (FDA approved for NASAL specimens only), is one component of a comprehensive MRSA colonization surveillance program. It is not intended to diagnose MRSA infection nor to guide or monitor treatment for MRSA infections. Performed at [facility], [place] [address] [place], [place] [address]          Radiology Studies: DG CHEST PORT 1 VIEW  Result Date: 10/19/2019 CLINICAL DATA:  Pneumothorax.  Chest tube.  COVID. EXAM: PORTABLE CHEST 1 VIEW COMPARISON:  10/18/2019. FINDINGS: Left chest tube noted stable position. Tiny left apical pneumothorax  again noted. Cardiomegaly again noted. Diffuse bilateral pulmonary interstitial infiltrates again noted with slight improvement in aeration from prior exam. No pleural effusion. IMPRESSION: 1. Left chest tube in stable position. Tiny left apical pneumothorax again noted. 2.  Cardiomegaly again noted. 3. Diffuse bilateral pulmonary interstitial infiltrates again noted with slight improvement in aeration from prior exam. Electronically Signed   By: Marcello Moores  Register   On: 10/19/2019 07:26   DG CHEST PORT 1 VIEW  Result Date: 10/18/2019 CLINICAL DATA:  Follow-up of pneumothorax.  COVID-19 pneumonia. EXAM: PORTABLE CHEST 1 VIEW COMPARISON:  Earlier today at 12:04 p.m. FINDINGS: 8:07 p.m. Frontal radiograph with degradation secondary to patient body habitus. Midline trachea. Cardiomegaly accentuated by AP portable technique. The left-sided chest tube demonstrates decreased angulation with mild curvature upon entrance into the pleural space. Lower lung predominant interstitial and airspace disease is worse on the right than left and similar to on the prior exam. Suspect a 5% left apical pneumothorax. IMPRESSION: Left chest tube remaining in place, with suspicion of a subtle, 5% left apical pneumothorax. Similar widespread bilateral COVID-19 pneumonia. Electronically Signed   By: Abigail Miyamoto M.D.   On: 10/18/2019 20:40   DG Chest Port 1 View  Result Date: 10/18/2019 CLINICAL DATA:  Dyspnea.  COVID-19 pneumonia. EXAM: PORTABLE CHEST 1 VIEW COMPARISON:  10/17/2019; 10/16/2019; 10/15/2019; chest CT-10/16/2019 FINDINGS: Grossly unchanged enlarged cardiac silhouette and mediastinal contours. Redemonstrated extensive bilateral interstitial and airspace opacities. There is abrupt kinking of the left-sided chest tube likely at its entrance site into the pleural space, however this could be accentuated due to obliquity. No pneumothorax. No definite pleural effusion. No acute osseous abnormalities. IMPRESSION: 1. Apparent  abrupt kinking of the left-sided chest tube likely at its entrance site into the pleural space, however this could be accentuated due to obliquity. No associated recurrent pneumothorax. 2. Grossly unchanged extensive bilateral interstitial and airspace opacities compatible with provided history of COVID 19 pneumonia. Electronically Signed   By: Sandi Mariscal M.D.   On: 10/18/2019 12:21   DG Chest Portable 1 View  Result Date: 10/17/2019 CLINICAL DATA:  Increased chest tightness and tenderness associated with chest tube. COVID positive. EXAM: PORTABLE CHEST 1 VIEW COMPARISON:  Radiograph earlier today.  Chest CT yesterday FINDINGS: Left-sided chest tube in place. No visualized pneumothorax. Pneumomediastinum which was better assessed on prior CT. Low lung volumes. Patchy heterogeneous bilateral airspace opacities in a mid-lower lung zone predominant distribution, unchanged. Stable heart size and mediastinal contours. IMPRESSION: 1. Left chest tube in place. No visualized pneumothorax. 2. Stable low lung volumes in bilateral COVID pneumonia. 3. Pneumomediastinum which was better assessed on CT yesterday. Electronically Signed   By: Keith Rake M.D.   On: 10/17/2019 23:30   DG Chest Portable 1 View  Result Date: 10/17/2019 CLINICAL DATA:  Left pneumothorax EXAM: PORTABLE CHEST 1 VIEW COMPARISON:  6:44 a.m. FINDINGS: Lung volumes are small and pulmonary insufflation has decreased since prior examination. Left chest tube is unchanged. No pneumothorax or pleural effusion. Multifocal pulmonary infiltrates are again identified, progressive at the right lung base and within the left upper lobe. Cardiac size within normal limits. No acute bone abnormality. IMPRESSION: 1. Diminished lung volumes with progressive multifocal pulmonary infiltrates. 2. Stable left chest tube. No pneumothorax. Electronically Signed   By: Fidela Salisbury MD   On: 10/17/2019 20:13        Scheduled Meds: . benzonatate  100 mg Oral Q8H   . enoxaparin (LOVENOX) injection  60 mg Subcutaneous Q24H  . insulin aspart  0-5 Units Subcutaneous QHS  . insulin aspart  0-6 Units Subcutaneous TID WC  . Ipratropium-Albuterol  1 puff Inhalation Q6H  . levothyroxine  200 mcg Oral QAC breakfast  . lidocaine (PF)  15 mL Intradermal Once  . pantoprazole  40 mg Oral BID  . predniSONE  60 mg Oral Q breakfast  . sodium chloride flush  10 mL Intracatheter Q8H   Continuous Infusions:   LOS: 3 days   Time spent= 35 mins   Dede Query, RN NP Student   If 7PM-7AM, please contact night-coverage  10/19/2019, 11:49 AM

## 2019-10-19 NOTE — Progress Notes (Signed)
NAMETimisha Calhoun, MRN:  [id number], DOB:  1995/09/14, LOS: 3 ADMISSION DATE:  10/16/2019, CONSULTATION DATE:  9/4 REFERRING MD:  Doristine Bosworth - TRH  CHIEF COMPLAINT:  Air in face and neck   Brief History   24 y o F recently discharged 9/3 after covid admission, who presented to [facility] 9/4 with "air in face and neck" CT reveals worse pneumomediastinum and now with new L ptx ED to Ed transfer PCCM consulted and chest tube was placed  Past Medical History  COVID-19 PNA Obesity Hypothyroidism [facility] Events   9/4 ED to ED transfer from [facility] for pneumomediastinum. Admitted to [facility]. After admit, while in ED, PCCm consulted for pneumomediastinum. PCCM noted L ptx on CT chest, placed L pigtail.   Consults:  PCCM   Procedures:  9/4 L pigtail >   Significant Diagnostic Tests:  9/4 CT chest> L PTX, large pneumomediastinum. Emphysema extending throughout neck. Multifocal PNA   Micro Data:    Antimicrobials:    Interim history/subjective:  Yesterday suction on chest tube was stopped and CXR repeated at 6 hours and this am, showed no re-developing pneumothorax. So chest tube removed with out complications.  Objective   Blood pressure 126/84, pulse (!) 107, temperature 98.2 F (36.8 C), temperature source Oral, resp. rate 20, height 5\' 5"  (1.651 m), weight 119.7 kg, SpO2 97 %.        Intake/Output Summary (Last 24 hours) at 10/19/2019 1347 Last data filed at 10/19/2019 1149 Gross per 24 hour  Intake 360 ml  Output 2102 ml  Net -1742 ml   Filed Weights   10/16/19 0321 10/18/19 0311  Weight: 133.8 kg 119.7 kg    Examination: General: Obese young adult F, sitting in the chair HENT: [place]. No crepitus Trachea midline Lungs: Coarse breath sounds bilaterally Cardiovascular: Regular rate and rhythm, no murmur Abdomen: obese soft round ndnt  Extremities: Symmetrical bulk and tone no obvious joint deformity  Neuro: AAOx4 following commands PERRLA Skin: No  rash  Resolved Hospital Problem list     Assessment & Plan:   Acute hypoxic respiratory failure in setting of COVID 19 PNA L sided pneumothorax s/p left anterior 14 French chest tube placement Pneumomediastinum, improved Morbid obesity   Repeat CXR showed no pneumothorax after stopping suction on chest tube yesterday, chest tube was discontinued without any complications. We will repeat x-ray chest in the morning, if that is a stable then she does not need any more intervention Rest of the management per primary team   PCCM will sign off, please call with question  Best practice:  Diet: PO  Pain/Anxiety/Delirium protocol (if indicated): APAP  VAP protocol (if indicated): na DVT prophylaxis: lovenox  GI prophylaxis: n/a Glucose control: monitor Mobility: PT OT  Code Status: Full  Family Communication: pt updated Disposition: Per primary team  Labs   CBC: Recent Labs  Lab 10/13/19 0628 10/13/19 0628 10/16/19 0517 10/16/19 1844 10/17/19 0515 10/18/19 0846 10/19/19 0259  WBC 17.5*   < > 17.0* 14.5* 14.6* 13.5* 13.2*  NEUTROABS 14.7*  --  13.3*  --  13.2* 10.0* 9.7*  HGB 15.6*   < > 14.8 15.6* 14.0 14.4 14.1  HCT 46.1*   < > 43.1 46.1* 42.2 42.8 41.7  MCV 84.6   < > 82.6 85.1 84.6 85.9 85.6  PLT 485*   < > 453* 421* 462* 374 381   < > = values in this interval not displayed.    Basic Metabolic Panel:  Recent Labs  Lab 10/13/19 0628 10/13/19 0628 10/14/19 1331 10/16/19 0517 10/17/19 0515 10/18/19 0846 10/19/19 0259  NA 136   < > 137 133* 136 138 136  K 4.3   < > 4.1 3.0* 4.0 3.4* 3.2*  CL 99   < > 100 96* 99 101 98  CO2 25   < > 24 25 25 24 26   GLUCOSE 136*   < > 219* 130* 161* 118* 116*  BUN 17   < > 18 16 12 15 11   CREATININE 0.91   < > 0.93 0.74 0.80 0.97 0.89  CALCIUM 9.2   < > 9.5 8.8* 9.0 9.0 9.0  MG 2.5*  --   --   --  2.6* 2.4 2.4  PHOS 2.9  --   --   --  4.3 3.4 4.4   < > = values in this interval not displayed.   GFR: Estimated Creatinine  Clearance: 127.4 mL/min (by C-G formula based on SCr of 0.89 mg/dL). Recent Labs  Lab 10/16/19 1844 10/17/19 0515 10/18/19 0846 10/19/19 0259  WBC 14.5* 14.6* 13.5* 13.2*    Liver Function Tests: Recent Labs  Lab 10/13/19 0628 10/17/19 0515 10/18/19 0846 10/19/19 0259  AST 18 15 18  13*  ALT 19 22 32 30  ALKPHOS 45 52 57 54  BILITOT 1.8* 1.5* 1.1 1.4*  PROT 7.5 6.9 6.6 6.6  ALBUMIN 3.9 3.6 3.4* 3.6   No results for input(s): LIPASE, AMYLASE in the last 168 hours. No results for input(s): AMMONIA in the last 168 hours.  ABG No results found for: PHART, PCO2ART, PO2ART, HCO3, TCO2, ACIDBASEDEF, O2SAT   Coagulation Profile: No results for input(s): INR, PROTIME in the last 168 hours.  Cardiac Enzymes: No results for input(s): CKTOTAL, CKMB, CKMBINDEX, TROPONINI in the last 168 hours.  HbA1C: Hgb A1c MFr Bld  Date/Time Value Ref Range Status  10/08/2019 08:52 AM 6.6 (H) 4.8 - 5.6 % Final    Comment:    (NOTE) Pre diabetes:          5.7%-6.4%  Diabetes:              >6.4%  Glycemic control for   <7.0% adults with diabetes     CBG: Recent Labs  Lab 10/18/19 1142 10/18/19 1628 10/18/19 2024 10/19/19 0840 10/19/19 1147  GLUCAP 241* 158* 175* 107* 200*    Review of Systems:   + SOB, cough + swelling face, chest + Fatigue, weakness + chest tightness   Past Medical History  She,  has a past medical history of Asthma and Hypothyroidism.   Surgical History   History reviewed. No pertinent surgical history.   Social History   reports that she has never smoked. She has never used smokeless tobacco. She reports current alcohol use. She reports that she does not use drugs.   Family History   Her family history includes Hypertension in her father and mother.   Allergies No Known Allergies   Home Medications  Prior to Admission medications   Medication Sig Start Date End Date Taking? Authorizing Provider  acetaminophen (TYLENOL) 500 MG tablet Take  500-1,000 mg by mouth every 6 (six) hours as needed for mild pain, fever or headache.    [provider]  albuterol (VENTOLIN HFA) 108 (90 Base) MCG/ACT inhaler Inhale 1-2 puffs into the lungs every 6 (six) hours as needed for wheezing or shortness of breath. 09/30/19   Henderly, Britni A, PA-C  benzonatate (TESSALON) 100 MG capsule  Take 1 capsule (100 mg total) by mouth every 8 (eight) hours. 09/30/19   Henderly, Britni A, PA-C  cetirizine (ZYRTEC ALLERGY) 10 MG tablet Take 10 mg by mouth daily as needed for allergies or rhinitis.    [provider]  chlorpheniramine-HYDROcodone (TUSSIONEX) 10-8 MG/5ML SUER Take 5 mLs by mouth every 12 (twelve) hours as needed for cough. 10/15/19   Bonnielee Haff, MD  fluticasone [facility]) 50 MCG/ACT nasal spray Place 2 sprays into both nostrils daily. 09/30/19   Henderly, Britni A, PA-C  furosemide (LASIX) 20 MG tablet Take 1 tablet (20 mg total) by mouth daily for 7 days. 10/15/19 10/22/19  Bonnielee Haff, MD  ibuprofen (ADVIL) 200 MG tablet Take 200-400 mg by mouth every 6 (six) hours as needed for fever, headache or mild pain.    [provider]  levothyroxine (SYNTHROID) 200 MCG tablet Take 200 mcg by mouth daily before breakfast.    [provider]  ondansetron (ZOFRAN ODT) 4 MG disintegrating tablet Take 1 tablet (4 mg total) by mouth every 8 (eight) hours as needed for nausea or vomiting. Patient taking differently: Take 4 mg by mouth every 8 (eight) hours as needed for nausea or vomiting (DISSOLVE ORALLY).  09/30/19   Henderly, Britni A, PA-C  oxyCODONE-acetaminophen (PERCOCET/ROXICET) 5-325 MG tablet Take 1 tablet by mouth every 6 (six) hours as needed for severe pain. 10/02/19   Breck Coons, MD  pantoprazole (PROTONIX) 40 MG tablet Take 1 tablet (40 mg total) by mouth 2 (two) times daily for 14 days. 10/15/19 10/29/19  Bonnielee Haff, MD  polyethylene glycol (MIRALAX / GLYCOLAX) 17 g packet Take 17 g by mouth daily as needed.  10/15/19   Bonnielee Haff, MD  predniSONE (DELTASONE) 20 MG tablet Take 3 tablets once daily for 3 days followed by 2 tablets once daily for 3 days followed by 1 tablet once daily for 3 days and then stop 10/15/19   Bonnielee Haff, MD      Jacky Kindle MD Critical care physician [place]  Pager: [phone] Mobile: [phone]

## 2019-10-20 ENCOUNTER — Inpatient Hospital Stay (HOSPITAL_BASED_OUTPATIENT_CLINIC_OR_DEPARTMENT_OTHER): Payer: BC Managed Care – PPO

## 2019-10-20 ENCOUNTER — Inpatient Hospital Stay (HOSPITAL_COMMUNITY): Payer: BC Managed Care – PPO

## 2019-10-20 DIAGNOSIS — E039 Hypothyroidism, unspecified: Secondary | ICD-10-CM

## 2019-10-20 DIAGNOSIS — U071 COVID-19: Principal | ICD-10-CM

## 2019-10-20 DIAGNOSIS — D473 Essential (hemorrhagic) thrombocythemia: Secondary | ICD-10-CM

## 2019-10-20 DIAGNOSIS — J9601 Acute respiratory failure with hypoxia: Secondary | ICD-10-CM

## 2019-10-20 DIAGNOSIS — M7989 Other specified soft tissue disorders: Secondary | ICD-10-CM

## 2019-10-20 LAB — CBC WITH DIFFERENTIAL/PLATELET
Abs Immature Granulocytes: 0.16 10*3/uL — ABNORMAL HIGH (ref 0.00–0.07)
Basophils Absolute: 0 10*3/uL (ref 0.0–0.1)
Basophils Relative: 0 %
Eosinophils Absolute: 0 10*3/uL (ref 0.0–0.5)
Eosinophils Relative: 0 %
HCT: 41.8 % (ref 36.0–46.0)
Hemoglobin: 13.9 g/dL (ref 12.0–15.0)
Immature Granulocytes: 1 %
Lymphocytes Relative: 15 %
Lymphs Abs: 1.9 10*3/uL (ref 0.7–4.0)
MCH: 28.1 pg (ref 26.0–34.0)
MCHC: 33.3 g/dL (ref 30.0–36.0)
MCV: 84.6 fL (ref 80.0–100.0)
Monocytes Absolute: 1 10*3/uL (ref 0.1–1.0)
Monocytes Relative: 8 %
Neutro Abs: 9.8 10*3/uL — ABNORMAL HIGH (ref 1.7–7.7)
Neutrophils Relative %: 76 %
Platelets: 309 10*3/uL (ref 150–400)
RBC: 4.94 MIL/uL (ref 3.87–5.11)
RDW: 14.4 % (ref 11.5–15.5)
WBC: 12.9 10*3/uL — ABNORMAL HIGH (ref 4.0–10.5)
nRBC: 0 % (ref 0.0–0.2)

## 2019-10-20 LAB — PHOSPHORUS: Phosphorus: 3.6 mg/dL (ref 2.5–4.6)

## 2019-10-20 LAB — COMPREHENSIVE METABOLIC PANEL
ALT: 43 U/L (ref 0–44)
AST: 17 U/L (ref 15–41)
Albumin: 3.6 g/dL (ref 3.5–5.0)
Alkaline Phosphatase: 58 U/L (ref 38–126)
Anion gap: 8 (ref 5–15)
BUN: 11 mg/dL (ref 6–20)
CO2: 28 mmol/L (ref 22–32)
Calcium: 9.3 mg/dL (ref 8.9–10.3)
Chloride: 102 mmol/L (ref 98–111)
Creatinine, Ser: 0.93 mg/dL (ref 0.44–1.00)
GFR calc Af Amer: >60 mL/min (ref >60)
GFR calc non Af Amer: >60 mL/min (ref >60)
Glucose, Bld: 125 mg/dL — ABNORMAL HIGH (ref 70–99)
Potassium: 3.5 mmol/L (ref 3.5–5.1)
Sodium: 138 mmol/L (ref 135–145)
Total Bilirubin: 1.1 mg/dL (ref 0.3–1.2)
Total Protein: 6.6 g/dL (ref 6.5–8.1)

## 2019-10-20 LAB — C-REACTIVE PROTEIN: CRP: 0.9 mg/dL (ref <1.0)

## 2019-10-20 LAB — GLUCOSE, CAPILLARY
Glucose-Capillary: 111 mg/dL — ABNORMAL HIGH (ref 70–99)
Glucose-Capillary: 118 mg/dL — ABNORMAL HIGH (ref 70–99)

## 2019-10-20 LAB — MAGNESIUM: Magnesium: 2.3 mg/dL (ref 1.7–2.4)

## 2019-10-20 MED ORDER — GUAIFENESIN ER 600 MG PO TB12: 600.0000 mg | ORAL_TABLET | Freq: Two times a day (BID) | ORAL | 0 refills | Status: AC

## 2019-10-20 MED ORDER — PREDNISONE 5 MG PO TABS: ORAL_TABLET | ORAL | 0 refills | Status: AC

## 2019-10-20 NOTE — TOC Transition Note (Signed)
Transition of Care [facility]) - CM/SW Discharge Note   Patient Details  Name: Sarah Calhoun MRN: [id number] Date of Birth: September 10, 1995  Transition of Care [facility]) CM/SW Contact:  Verdell Carmine, RN Phone Number: 10/20/2019, 3:50 PM   Clinical Narrative:    Patient readmitted after one day , had to have chest tube. It is now out, patient ready to discharge continues to qualify for oxygen already set up with adapt at home . They will bring a tank  For transport home.    Final next level of care: Home/Self Care Barriers to Discharge: No Barriers Identified   Patient Goals and CMS Choice        Discharge Placement                       Discharge Plan and Services                DME Arranged: Oxygen (portable tank to go home)                    Social Determinants of Health (SDOH) Interventions     Readmission Risk Interventions No flowsheet data found.

## 2019-10-20 NOTE — Progress Notes (Signed)
Bilateral lower extremity venous duplex completed. Refer to "CV Proc" under chart review to view preliminary results.  10/20/2019 3:10 PM Kelby Aline., MHA, RVT, RDCS, RDMS

## 2019-10-20 NOTE — Plan of Care (Signed)
  Problem: Education: Goal: Knowledge of General Education information will improve Description: Including pain rating scale, medication(s)/side effects and non-pharmacologic comfort measures 10/20/2019 1549 by Clemmie Krill, RN Outcome: Adequate for Discharge 10/20/2019 1549 by Clemmie Krill, RN Outcome: Progressing   Problem: Health Behavior/Discharge Planning: Goal: Ability to manage health-related needs will improve 10/20/2019 1549 by Clemmie Krill, RN Outcome: Adequate for Discharge 10/20/2019 1549 by Clemmie Krill, RN Outcome: Progressing   Problem: Clinical Measurements: Goal: Ability to maintain clinical measurements within normal limits will improve 10/20/2019 1549 by Clemmie Krill, RN Outcome: Adequate for Discharge 10/20/2019 1549 by Clemmie Krill, RN Outcome: Progressing Goal: Will remain free from infection 10/20/2019 1549 by Clemmie Krill, RN Outcome: Adequate for Discharge 10/20/2019 1549 by Clemmie Krill, RN Outcome: Progressing Goal: Diagnostic test results will improve 10/20/2019 1549 by Clemmie Krill, RN Outcome: Adequate for Discharge 10/20/2019 1549 by Clemmie Krill, RN Outcome: Progressing Goal: Respiratory complications will improve 10/20/2019 1549 by Clemmie Krill, RN Outcome: Adequate for Discharge 10/20/2019 1549 by Clemmie Krill, RN Outcome: Progressing Goal: Cardiovascular complication will be avoided 10/20/2019 1549 by Clemmie Krill, RN Outcome: Adequate for Discharge 10/20/2019 1549 by Clemmie Krill, RN Outcome: Progressing   Problem: Activity: Goal: Risk for activity intolerance will decrease 10/20/2019 1549 by Clemmie Krill, RN Outcome: Adequate for Discharge 10/20/2019 1549 by Clemmie Krill, RN Outcome: Progressing   Problem: Nutrition: Goal: Adequate nutrition will be maintained 10/20/2019 1549 by Clemmie Krill, RN Outcome: Adequate for Discharge 10/20/2019 1549 by Clemmie Krill, RN Outcome: Progressing   Problem: Coping: Goal: Level of anxiety  will decrease 10/20/2019 1549 by Clemmie Krill, RN Outcome: Adequate for Discharge 10/20/2019 1549 by Clemmie Krill, RN Outcome: Progressing   Problem: Elimination: Goal: Will not experience complications related to bowel motility 10/20/2019 1549 by Clemmie Krill, RN Outcome: Adequate for Discharge 10/20/2019 1549 by Clemmie Krill, RN Outcome: Progressing Goal: Will not experience complications related to urinary retention 10/20/2019 1549 by Clemmie Krill, RN Outcome: Adequate for Discharge 10/20/2019 1549 by Clemmie Krill, RN Outcome: Progressing   Problem: Pain Managment: Goal: General experience of comfort will improve 10/20/2019 1549 by Clemmie Krill, RN Outcome: Adequate for Discharge 10/20/2019 1549 by Clemmie Krill, RN Outcome: Progressing   Problem: Safety: Goal: Ability to remain free from injury will improve 10/20/2019 1549 by Clemmie Krill, RN Outcome: Adequate for Discharge 10/20/2019 1549 by Clemmie Krill, RN Outcome: Progressing   Problem: Skin Integrity: Goal: Risk for impaired skin integrity will decrease 10/20/2019 1549 by Clemmie Krill, RN Outcome: Adequate for Discharge 10/20/2019 1549 by Clemmie Krill, RN Outcome: Progressing   Patient AXOX4, vss, discharge reviewed, iv removed tip in tack, oxygen delivered to unit and given to patient. Safe discharge.

## 2019-10-20 NOTE — Progress Notes (Signed)
SATURATION QUALIFICATIONS: (This note is used to comply with regulatory documentation for home oxygen)  Patient Saturations on Room Air at Rest = 92%  Patient Saturations on Room Air while Ambulating = 86%  Patient Saturations on 3Liters of oxygen while Ambulating = 88-92%  Please briefly explain why patient needs home oxygen: Covid Positive, Chest tube removed <24 hours ago

## 2019-10-21 NOTE — Discharge Summary (Signed)
Physician Discharge Summary  Mandolin Falwell RCV:[id number] DOB: [birth year] DOA: 10/16/2019  PCP: Loraine Leriche., MD  Admit date: 10/16/2019 Discharge date: 10/20/2019  Admitted From: Home Disposition: Home   Recommendations for Outpatient Follow-up:  1. Follow up with PCP in 1-2 weeks.  Home Health: None Equipment/Devices: 3L O2 on exertion, no hypoxia at rest Discharge Condition: Stable CODE STATUS: Full Diet recommendation: As tolerated.  Brief/Interim Summary: Georgeanna Radziewicz is a 24 year old recently had COVID-19 pneumonia requiring routine treatment complicated by pneumomediastinum which was conservatively managed.  After discharging home she became acutely short of breath therefore came back to the hospital with worsening of her pneumomediastinum and pneumothorax therefore chest tube was placed.  Pulmonary team was consulted. Hypoxia improved with improvement in PTX. Chets tube was removed 9/7 with stability on CXR 24 hours later 9/8. The patient has remained stable and PCCM feels no further evaluation or treatment is required prior to discharge. She will continue supplemental oxygen and maximize antitussive regimen.  Discharge Diagnoses:  Principal Problem:   Pneumomediastinum ([place]) Active Problems:   Obesity, Class III, BMI 40-49.9 (morbid obesity) ([place])   Acute hypoxemic respiratory failure due to COVID-19 [facility])   Hypothyroidism   Leukocytosis   Thrombocytosis ([facility])  Discharge Instructions Discharge Instructions    Diet - low sodium heart healthy   Complete by: As directed    Discharge instructions   Complete by: As directed    You are being discharged from the hospital after treatment for pneumothorax (air around the lung) and pneumomediastinum (air trapped in the tissues in the front, middle chest area) that are complications from [id number] infection. Your chest xray was reviewed by the pulmonary doctor and the medical doctor who are reassured that these problems are  improving even after the chest tube has been removed. You are felt to be stable enough to no longer require inpatient monitoring, testing, and treatment, though you will need to follow the recommendations below: - Continue taking prednisone with a taper over the following 10 days (sent to your pharmacy) - Take cough medications to minimize your cough. Mucinex twice daily is recommended.  - Per CDC guidelines, you will need to remain in isolation for 21 days from your first positive covid test. - Follow up with your doctor in the next week or seek medical attention right away if your symptoms get WORSE.   Directions for you at home:  Wear a facemask You should wear a facemask that covers your nose and mouth when you are in the same room with other people and when you visit a healthcare provider. People who live with or visit you should also wear a facemask while they are in the same room with you.  Separate yourself from other people in your home As much as possible, you should stay in a different room from other people in your home. Also, you should use a separate bathroom, if available.  Avoid sharing household items You should not share dishes, drinking glasses, cups, eating utensils, towels, bedding, or other items with other people in your home. After using these items, you should wash them thoroughly with soap and water.  Cover your coughs and sneezes Cover your mouth and nose with a tissue when you cough or sneeze, or you can cough or sneeze into your sleeve. Throw used tissues in a lined trash can, and immediately wash your hands with soap and water for at least 20 seconds or use an alcohol-based hand rub.  Wash your Proofreader  your hands often and thoroughly with soap and water for at least 20 seconds. You can use an alcohol-based hand sanitizer if soap and water are not available and if your hands are not visibly dirty. Avoid touching your eyes, nose, and mouth with unwashed  hands.  Directions for those who live with, or provide care at home for you:  Limit the number of people who have contact with the patient If possible, have only one caregiver for the patient. Other household members should stay in another home or place of residence. If this is not possible, they should stay in another room, or be separated from the patient as much as possible. Use a separate bathroom, if available. Restrict visitors who do not have an essential need to be in the home.  Ensure good ventilation Make sure that shared spaces in the home have good air flow, such as from an air conditioner or an opened window, weather permitting.  Wash your hands often Wash your hands often and thoroughly with soap and water for at least 20 seconds. You can use an alcohol based hand sanitizer if soap and water are not available and if your hands are not visibly dirty. Avoid touching your eyes, nose, and mouth with unwashed hands. Use disposable paper towels to dry your hands. If not available, use dedicated cloth towels and replace them when they become wet.  Wear a facemask and gloves Wear a disposable facemask at all times in the room and gloves when you touch or have contact with the patient's blood, body fluids, and/or secretions or excretions, such as sweat, saliva, sputum, nasal mucus, vomit, urine, or feces.  Ensure the mask fits over your nose and mouth tightly, and do not touch it during use. Throw out disposable facemasks and gloves after using them. Do not reuse. Wash your hands immediately after removing your facemask and gloves. If your personal clothing becomes contaminated, carefully remove clothing and launder. Wash your hands after handling contaminated clothing. Place all used disposable facemasks, gloves, and other waste in a lined container before disposing them with other household waste. Remove gloves and wash your hands immediately after handling these items.  Do not  share dishes, glasses, or other household items with the patient Avoid sharing household items. You should not share dishes, drinking glasses, cups, eating utensils, towels, bedding, or other items with a patient who is confirmed to have, or being evaluated for, COVID-19 infection. After the person uses these items, you should wash them thoroughly with soap and water.  Wash laundry thoroughly Immediately remove and wash clothes or bedding that have blood, body fluids, and/or secretions or excretions, such as sweat, saliva, sputum, nasal mucus, vomit, urine, or feces, on them. Wear gloves when handling laundry from the patient. Read and follow directions on labels of laundry or clothing items and detergent. In general, wash and dry with the warmest temperatures recommended on the label.  Clean all areas the individual has used often Clean all touchable surfaces, such as counters, tabletops, doorknobs, bathroom fixtures, toilets, phones, keyboards, tablets, and bedside tables, every day. Also, clean any surfaces that may have blood, body fluids, and/or secretions or excretions on them. Wear gloves when cleaning surfaces the patient has come in contact with. Use a diluted bleach solution (e.g., dilute bleach with 1 part bleach and 10 parts water) or a household disinfectant with a label that says EPA-registered for coronaviruses. To make a bleach solution at home, add 1 tablespoon of bleach to 1  quart (4 cups) of water. For a larger supply, add  cup of bleach to 1 gallon (16 cups) of water. Read labels of cleaning products and follow recommendations provided on product labels. Labels contain instructions for safe and effective use of the cleaning product including precautions you should take when applying the product, such as wearing gloves or eye protection and making sure you have good ventilation during use of the product. Remove gloves and wash hands immediately after cleaning.  Monitor yourself  for signs and symptoms of illness Caregivers and household members are considered close contacts, should monitor their health, and will be asked to limit movement outside of the home to the extent possible. Follow the monitoring steps for close contacts listed on the symptom monitoring form.  If you have additional questions, contact your local health department or call the epidemiologist on call at [phone] (available 24/7). This guidance is subject to change. For the most up-to-date guidance from [facility], please refer to their website: [email or web address]   Increase activity slowly   Complete by: As directed    No wound care   Complete by: As directed      Allergies as of 10/20/2019   No Known Allergies     Medication List    STOP taking these medications   furosemide 20 MG tablet Commonly known as: Lasix     TAKE these medications   acetaminophen 500 MG tablet Commonly known as: TYLENOL Take 500-1,000 mg by mouth every 6 (six) hours as needed for mild pain, fever or headache.   albuterol 108 (90 Base) MCG/ACT inhaler Commonly known as: VENTOLIN HFA Inhale 1-2 puffs into the lungs every 6 (six) hours as needed for wheezing or shortness of breath.   benzonatate 100 MG capsule Commonly known as: TESSALON Take 1 capsule (100 mg total) by mouth every 8 (eight) hours.   chlorpheniramine-HYDROcodone 10-8 MG/5ML Suer Commonly known as: TUSSIONEX Take 5 mLs by mouth every 12 (twelve) hours as needed for cough.   fluticasone 50 MCG/ACT nasal spray Commonly known as: FLONASE Place 2 sprays into both nostrils daily.   guaiFENesin 600 MG 12 hr tablet Commonly known as: Mucinex Take 1 tablet (600 mg total) by mouth 2 (two) times daily.   ibuprofen 200 MG tablet Commonly known as: ADVIL Take 200-400 mg by mouth every 6 (six) hours as needed for fever, headache or mild pain.   levothyroxine 200 MCG tablet Commonly known  as: SYNTHROID Take 200 mcg by mouth daily before breakfast.   ondansetron 4 MG disintegrating tablet Commonly known as: Zofran ODT Take 1 tablet (4 mg total) by mouth every 8 (eight) hours as needed for nausea or vomiting. What changed: reasons to take this   oxyCODONE-acetaminophen 5-325 MG tablet Commonly known as: PERCOCET/ROXICET Take 1 tablet by mouth every 6 (six) hours as needed for severe pain.   pantoprazole 40 MG tablet Commonly known as: PROTONIX Take 1 tablet (40 mg total) by mouth 2 (two) times daily for 14 days.   polyethylene glycol 17 g packet Commonly known as: MIRALAX / GLYCOLAX Take 17 g by mouth daily as needed. What changed: reasons to take this   predniSONE 5 MG tablet Commonly known as: DELTASONE Take 4 tablets (20 mg total) by mouth daily with breakfast for 2 days, THEN 2 tablets (10 mg total) daily with breakfast for 2 days, THEN 1 tablet (5 mg total) daily with breakfast for 2 days, THEN 0.5 tablets (2.5 mg total) daily with breakfast for 4  days. Start taking on: October 20, 2019 What changed:   medication strength  See the new instructions.   ZyrTEC Allergy 10 MG tablet Generic drug: cetirizine Take 10 mg by mouth daily as needed for allergies or rhinitis.       Follow-up Information    Velazquez, Fransico Meadow., MD. Schedule an appointment as soon as possible for a visit in 1 week(s).   Specialty: Internal Medicine Contact information: [place] [id number] [phone]              No Known Allergies  Consultations:  PCCM  Procedures/Studies: CT Soft Tissue Neck Wo Contrast  Result Date: 10/16/2019 CLINICAL DATA:  Pneumomediastinum.  Possible esophageal perforation. EXAM: CT NECK AND CHEST WITHOUT CONTRAST COMPARISON:  CT neck 10/09/2019 FINDINGS: CT NECK FINDINGS Pharynx and larynx: The pharynx and larynx are normal. 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 The  amount of air has increased from the prior study. Salivary glands: No inflammation, mass, or stone. Thyroid: Normal. Lymph nodes: None enlarged or abnormal density. Vascular: Negative. Limited intracranial: Normal Visualized orbits: Normal Mastoids and visualized paranasal sinuses: Negative Skeleton: No acute or aggressive process. CT CHEST FINDINGS Cardiovascular: No significant vascular findings. Normal heart size. No pericardial effusion. Mediastinum/Nodes: Large amount of pneumomediastinum. Lungs/Pleura: Intermediate sized left pneumothorax. Widespread hazy airspace opacities. Upper Abdomen: No acute abnormality. Musculoskeletal: No chest wall mass or suspicious bone lesions identified. IMPRESSION: 1. Worsened large volume pneumomediastinum with emphysema extending throughout the deep spaces of the neck. 2. Large left pneumothorax. 3. Widespread multifocal pneumonia. Electronically Signed   By: Ulyses Jarred M.D.   On: 10/16/2019 04:55   CT SOFT TISSUE NECK W CONTRAST  Result Date: 10/09/2019 CLINICAL DATA:  Neck pain, history of thyroid disease EXAM: CT NECK WITH CONTRAST TECHNIQUE: Multidetector CT imaging of the neck was performed using the standard protocol following the bolus administration of intravenous contrast. CONTRAST:  48mL OMNIPAQUE IOHEXOL 300 MG/ML  SOLN COMPARISON:  Correlation made with prior chest radiographs FINDINGS: Motion artifact is present. Pharynx and larynx: Unremarkable.  No mass or swelling. Salivary glands: Unremarkable. Thyroid: Unremarkable. Lymph nodes: No enlarged nodes identified. Vascular: Major neck vessels are patent. Limited intracranial: No abnormal enhancement. Visualized orbits: Not included. Mastoids and visualized paranasal sinuses: Aerated. Skeleton: No significant abnormality. Upper chest: Partially imaged pneumomediastinum. Bilateral pulmonary ground-glass opacities reflecting known pneumonia. Other: No mediastinum extends into the soft tissues of the neck  IMPRESSION: Pneumomediastinum extending into the neck soft tissues. In retrospect, this was probably present on 10/07/2019 chest radiograph. These results were called by telephone at the time of interpretation on 10/09/2019 at 12:42 pm to provider [facility] , who verbally acknowledged these results. Electronically Signed   By: Macy Mis M.D.   On: 10/09/2019 12:50   CT Chest Wo Contrast  Result Date: 10/16/2019 CLINICAL DATA:  Pneumomediastinum.  Possible esophageal perforation. EXAM: CT NECK AND CHEST WITHOUT CONTRAST COMPARISON:  CT neck 10/09/2019 FINDINGS: CT NECK FINDINGS Pharynx and larynx: The pharynx and larynx are normal. 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 The amount of air has increased from the prior study. Salivary glands: No inflammation, mass, or stone. Thyroid: Normal. Lymph nodes: None enlarged or abnormal density. Vascular: Negative. Limited intracranial: Normal Visualized orbits: Normal Mastoids and visualized paranasal sinuses: Negative Skeleton: No acute or aggressive process. CT CHEST FINDINGS Cardiovascular: No significant vascular findings. Normal heart size. No pericardial effusion. Mediastinum/Nodes: Large amount of  pneumomediastinum. Lungs/Pleura: Intermediate sized left pneumothorax. Widespread hazy airspace opacities. Upper Abdomen: No acute abnormality. Musculoskeletal: No chest wall mass or suspicious bone lesions identified. IMPRESSION: 1. Worsened large volume pneumomediastinum with emphysema extending throughout the deep spaces of the neck. 2. Large left pneumothorax. 3. Widespread multifocal pneumonia. Electronically Signed   By: Ulyses Jarred M.D.   On: 10/16/2019 04:55   CT Angio Chest PE W and/or Wo Contrast  Result Date: 09/30/2019 CLINICAL DATA:  Dry cough, sore throat, fever, chills, COVID-19 positive EXAM: CT ANGIOGRAPHY CHEST WITH CONTRAST TECHNIQUE: Multidetector CT imaging of the chest was  performed using the standard protocol during bolus administration of intravenous contrast. Multiplanar CT image reconstructions and MIPs were obtained to evaluate the vascular anatomy. CONTRAST:  159mL OMNIPAQUE IOHEXOL 350 MG/ML SOLN COMPARISON:  09/30/2019 FINDINGS: Cardiovascular: This is a technically adequate evaluation of the pulmonary vasculature. There are no filling defects or pulmonary emboli. The heart is unremarkable with no significant pericardial effusion. Thoracic aorta is unremarkable. Mediastinum/Nodes: Thyroid, trachea, and esophagus are unremarkable. Small lymph nodes are seen in the bilateral hilar regions, likely reactive. No pathologic adenopathy. Lungs/Pleura: Nodular airspace disease is seen bilaterally throughout the lungs, most consistent with multifocal pneumonia. No effusion or pneumothorax. Central airways are patent. Upper Abdomen: No acute abnormality. Musculoskeletal: No acute or destructive bony lesions. Reconstructed images demonstrate no additional findings. Review of the MIP images confirms the above findings. IMPRESSION: 1. No evidence of pulmonary embolus. 2. Multifocal bilateral nodular airspace disease, compatible with COVID-19 pneumonia. Electronically Signed   By: Randa Ngo M.D.   On: 09/30/2019 21:05   DG CHEST PORT 1 VIEW  Result Date: 10/20/2019 CLINICAL DATA:  History of pneumothorax and pneumomediastinum. Post chest tube placement, now discontinued. EXAM: PORTABLE CHEST 1 VIEW COMPARISON:  10/19/2019 FINDINGS: Trachea is midline. Cardiomediastinal contours accentuated by portable technique are stable. Small LEFT apical pneumothorax is suspected. Lucency along the medial aspect of the LEFT lung, along the LEFT heart border with similar appearance, associated with linear density as well, unchanged. Interval removal of LEFT-sided chest tube. Patchy areas of interstitial and airspace opacity worse at the lung bases similar to the prior exam. No acute skeletal  process on limited assessment. IMPRESSION: 1. Interval removal of LEFT-sided chest tube, otherwise no change in the appearance of the chest since the previous radiograph. 2. Signs of pneumomediastinum and potential persistent LEFT apical pneumothorax. Size of pneumothorax would be difficult to quantify on portable radiography if anterior. 3. Patchy areas of interstitial and airspace opacity worse at the lung bases similar to the prior exam. Electronically Signed   By: Zetta Bills M.D.   On: 10/20/2019 08:01   DG CHEST PORT 1 VIEW  Result Date: 10/19/2019 CLINICAL DATA:  Pneumothorax.  Chest tube.  COVID. EXAM: PORTABLE CHEST 1 VIEW COMPARISON:  10/18/2019. FINDINGS: Left chest tube noted stable position. Tiny left apical pneumothorax again noted. Cardiomegaly again noted. Diffuse bilateral pulmonary interstitial infiltrates again noted with slight improvement in aeration from prior exam. No pleural effusion. IMPRESSION: 1. Left chest tube in stable position. Tiny left apical pneumothorax again noted. 2.  Cardiomegaly again noted. 3. Diffuse bilateral pulmonary interstitial infiltrates again noted with slight improvement in aeration from prior exam. Electronically Signed   By: Marcello Moores  Register   On: 10/19/2019 07:26   DG CHEST PORT 1 VIEW  Result Date: 10/18/2019 CLINICAL DATA:  Follow-up of pneumothorax.  COVID-19 pneumonia. EXAM: PORTABLE CHEST 1 VIEW COMPARISON:  Earlier today at 12:04 p.m. FINDINGS: 8:07  p.m. Frontal radiograph with degradation secondary to patient body habitus. Midline trachea. Cardiomegaly accentuated by AP portable technique. The left-sided chest tube demonstrates decreased angulation with mild curvature upon entrance into the pleural space. Lower lung predominant interstitial and airspace disease is worse on the right than left and similar to on the prior exam. Suspect a 5% left apical pneumothorax. IMPRESSION: Left chest tube remaining in place, with suspicion of a subtle, 5% left  apical pneumothorax. Similar widespread bilateral COVID-19 pneumonia. Electronically Signed   By: Abigail Miyamoto M.D.   On: 10/18/2019 20:40   DG Chest Port 1 View  Result Date: 10/18/2019 CLINICAL DATA:  Dyspnea.  COVID-19 pneumonia. EXAM: PORTABLE CHEST 1 VIEW COMPARISON:  10/17/2019; 10/16/2019; 10/15/2019; chest CT-10/16/2019 FINDINGS: Grossly unchanged enlarged cardiac silhouette and mediastinal contours. Redemonstrated extensive bilateral interstitial and airspace opacities. There is abrupt kinking of the left-sided chest tube likely at its entrance site into the pleural space, however this could be accentuated due to obliquity. No pneumothorax. No definite pleural effusion. No acute osseous abnormalities. IMPRESSION: 1. Apparent abrupt kinking of the left-sided chest tube likely at its entrance site into the pleural space, however this could be accentuated due to obliquity. No associated recurrent pneumothorax. 2. Grossly unchanged extensive bilateral interstitial and airspace opacities compatible with provided history of COVID 19 pneumonia. Electronically Signed   By: Sandi Mariscal M.D.   On: 10/18/2019 12:21   DG Chest Portable 1 View  Result Date: 10/17/2019 CLINICAL DATA:  Increased chest tightness and tenderness associated with chest tube. COVID positive. EXAM: PORTABLE CHEST 1 VIEW COMPARISON:  Radiograph earlier today.  Chest CT yesterday FINDINGS: Left-sided chest tube in place. No visualized pneumothorax. Pneumomediastinum which was better assessed on prior CT. Low lung volumes. Patchy heterogeneous bilateral airspace opacities in a mid-lower lung zone predominant distribution, unchanged. Stable heart size and mediastinal contours. IMPRESSION: 1. Left chest tube in place. No visualized pneumothorax. 2. Stable low lung volumes in bilateral COVID pneumonia. 3. Pneumomediastinum which was better assessed on CT yesterday. Electronically Signed   By: Keith Rake M.D.   On: 10/17/2019 23:30   DG  Chest Portable 1 View  Result Date: 10/17/2019 CLINICAL DATA:  Left pneumothorax EXAM: PORTABLE CHEST 1 VIEW COMPARISON:  6:44 a.m. FINDINGS: Lung volumes are small and pulmonary insufflation has decreased since prior examination. Left chest tube is unchanged. No pneumothorax or pleural effusion. Multifocal pulmonary infiltrates are again identified, progressive at the right lung base and within the left upper lobe. Cardiac size within normal limits. No acute bone abnormality. IMPRESSION: 1. Diminished lung volumes with progressive multifocal pulmonary infiltrates. 2. Stable left chest tube. No pneumothorax. Electronically Signed   By: Fidela Salisbury MD   On: 10/17/2019 20:13   DG CHEST PORT 1 VIEW  Result Date: 10/17/2019 CLINICAL DATA:  COVID-19 positive. EXAM: PORTABLE CHEST 1 VIEW COMPARISON:  October 16, 2019. FINDINGS: Stable cardiomediastinal silhouette. Stable patchy airspace opacities are noted bilaterally consistent with multifocal pneumonia. Stable position of left-sided chest tube. No definite pneumothorax or pleural effusion is noted. Bony thorax is unremarkable. IMPRESSION: Stable bilateral multifocal pneumonia. Stable position of left-sided chest tube. No definite pneumothorax is noted. Electronically Signed   By: Marijo Conception M.D.   On: 10/17/2019 08:22   DG CHEST PORT 1 VIEW  Result Date: 10/16/2019 CLINICAL DATA:  Left-sided chest tube placement. EXAM: PORTABLE CHEST 1 VIEW COMPARISON:  Earlier the same day. FINDINGS: There has been interval placement of left-sided chest tube. Radiographic resolution of  the previously demonstrated left-sided pneumothorax. The pneumomediastinum demonstrated by chest CT earlier today is barely visualized radiographically. Soft tissue emphysema within the neck is seen. Cardiomediastinal silhouette is normal. Mediastinal contours appear intact. Low lung volumes with worsening bilateral patchy areas of airspace consolidation with lower lobe predominance.  Osseous structures are without acute abnormality. Soft tissues are grossly normal. IMPRESSION: 1. Interval placement of left-sided chest tube with radiographic resolution of the previously demonstrated left-sided pneumothorax. 2. Low lung volumes with worsening bilateral patchy areas of airspace consolidation with lower lobe predominance. 3. Soft tissue emphysema within the neck. Poor visualization of the known pneumomediastinum demonstrated by chest CT earlier today. Electronically Signed   By: Fidela Salisbury M.D.   On: 10/16/2019 17:51   DG CHEST PORT 1 VIEW  Result Date: 10/16/2019 CLINICAL DATA:  Shortness of breath. EXAM: PORTABLE CHEST 1 VIEW COMPARISON:  October 15, 2019 FINDINGS: Cardiomediastinal silhouette is normal. Mediastinal contours appear intact. Persistent patchy bilateral with lower lobe predominance airspace consolidation. The pneumomediastinum demonstrated by CT earlier today is not well seen radiographically. There is moderate left pneumothorax. Osseous structures are without acute abnormality. Soft tissues are grossly normal. IMPRESSION: 1. Moderate left pneumothorax. 2. Persistent patchy bilateral with lower lobe predominance airspace consolidation consistent with COVID-19 pneumonia. 3. The pneumomediastinum demonstrated by chest CT earlier today is not well seen radiographically. Electronically Signed   By: Fidela Salisbury M.D.   On: 10/16/2019 16:17   DG Chest Port 1 View  Result Date: 10/15/2019 CLINICAL DATA:  COVID pneumonia. EXAM: PORTABLE CHEST 1 VIEW COMPARISON:  10/13/2019. FINDINGS: Findings suggesting pneumomediastinum. Stable cardiomegaly. Diffuse progressive bilateral pulmonary infiltrates. No pleural effusion. Small right apical pneumothorax most likely present. IMPRESSION: 1. Findings suggesting pneumomediastinum noted on today's exam. Tiny right apical pneumothorax also appears to be present on today's exam. 2. Progressive diffuse bilateral pulmonary infiltrates  consistent with progressive COVID pneumonia. Electronically Signed   By: Marcello Moores  Register   On: 10/15/2019 07:27   DG CHEST PORT 1 VIEW  Result Date: 10/13/2019 CLINICAL DATA:  Shortness of breath. EXAM: PORTABLE CHEST 1 VIEW COMPARISON:  October 12, 2019. FINDINGS: Stable cardiomediastinal silhouette. No pneumothorax or pleural effusion is noted. Hypoinflation of the lungs is noted with mild bibasilar subsegmental atelectasis or infiltrates. Bony thorax is unremarkable. IMPRESSION: Hypoinflation of the lungs with mild bibasilar subsegmental atelectasis or infiltrates. Electronically Signed   By: Marijo Conception M.D.   On: 10/13/2019 08:38   DG CHEST PORT 1 VIEW  Result Date: 10/12/2019 CLINICAL DATA:  COVID. EXAM: PORTABLE CHEST 1 VIEW COMPARISON:  10/11/2019 CT neck 10/09/2019. FINDINGS: Pneumomediastinum again noted. Heart size stable. Low lung volumes with bibasilar pulmonary infiltrates again noted. No pleural effusion. Thorax IMPRESSION: 1.  Pneumomediastinum again noted. 2. Low lung volumes with bibasilar pulmonary infiltrates again noted. No interim change Electronically Signed   By: Marcello Moores  Register   On: 10/12/2019 07:11   DG CHEST PORT 1 VIEW  Result Date: 10/11/2019 CLINICAL DATA:  Dyspnea, COVID-19 positive. EXAM: PORTABLE CHEST 1 VIEW COMPARISON:  October 10, 2019. FINDINGS: The heart size and mediastinal contours are within normal limits. Minimal bibasilar subsegmental atelectasis or infiltrates are noted. Stable pneumomediastinum. The visualized skeletal structures are unremarkable. IMPRESSION: Stable pneumomediastinum. Minimal bibasilar subsegmental atelectasis or infiltrates are noted. Electronically Signed   By: Marijo Conception M.D.   On: 10/11/2019 08:17   DG CHEST PORT 1 VIEW  Result Date: 10/10/2019 CLINICAL DATA:  Shortness of breath.  COVID. EXAM: PORTABLE CHEST 1  VIEW COMPARISON:  10/09/2019 FINDINGS: Stable cardiomediastinal contours. Aortic atherosclerosis noted. Decreased  lung volumes. No change in diffuse interstitial prominence compatible with viral pneumonia. No lobar consolidation, pleural effusion or pneumothorax. Previous pneumomediastinum is less conspicuous on today's study. IMPRESSION: 1. Persistent bilateral interstitial prominence compatible with viral pneumonia. 2. Pneumomediastinum is less conspicuous on today's study. Electronically Signed   By: Kerby Moors M.D.   On: 10/10/2019 08:49   DG CHEST PORT 1 VIEW  Result Date: 10/09/2019 CLINICAL DATA:  Shortness of breath.  COVID-19 positive. EXAM: PORTABLE CHEST 1 VIEW COMPARISON:  October 07, 2019 FINDINGS: Mild opacity in the lung bases. The heart, hila, mediastinum, lungs, and pleura are otherwise unremarkable. IMPRESSION: Mild opacities in the lung bases concerning for multifocal pneumonia. No other acute abnormalities. Electronically Signed   By: Dorise Bullion III M.D   On: 10/09/2019 14:58   DG CHEST PORT 1 VIEW  Result Date: 10/07/2019 CLINICAL DATA:  Follow-up pneumonia EXAM: PORTABLE CHEST 1 VIEW COMPARISON:  10/04/2019 FINDINGS: Mild cardiac enlargement, stable. No pleural effusion identified. Unchanged appearance of bilateral patchy opacities within both lungs concerning for multifocal pneumonia. The visualized osseous structures are unremarkable. IMPRESSION: Persistent bilateral patchy opacities concerning for multifocal pneumonia. Electronically Signed   By: Kerby Moors M.D.   On: 10/07/2019 07:49   DG Chest Port 1 View  Result Date: 10/04/2019 CLINICAL DATA:  Shortness of breath. EXAM: PORTABLE CHEST 1 VIEW COMPARISON:  Same day. FINDINGS: The heart size and mediastinal contours are within normal limits. No pneumothorax or pleural effusion is noted. Continued presence of multiple opacity seen throughout both lungs concerning for multifocal pneumonia. The visualized skeletal structures are unremarkable. IMPRESSION: Continued presence of multiple bilateral lung opacities concerning for  multifocal pneumonia. Electronically Signed   By: Marijo Conception M.D.   On: 10/04/2019 11:24   DG Chest Portable 1 View  Result Date: 10/02/2019 CLINICAL DATA:  Worsening shortness of breath, COVID-19 positive EXAM: PORTABLE CHEST 1 VIEW COMPARISON:  09/30/2019 FINDINGS: Single frontal view of the chest demonstrates a stable cardiac silhouette. Lung volumes are diminished. The bilateral ground-glass airspace disease seen on prior chest x-ray is not significantly changed on this exam. No effusion or pneumothorax. No acute bony abnormality. IMPRESSION: 1. Stable multifocal pneumonia.  Pattern consistent with COVID-19. Electronically Signed   By: Randa Ngo M.D.   On: 10/02/2019 16:56   DG Chest Portable 1 View  Result Date: 09/30/2019 CLINICAL DATA:  COVID.  Headache. EXAM: PORTABLE CHEST 1 VIEW COMPARISON:  None. FINDINGS: Normal cardiac silhouette. Central venous pulmonary congestion. No focal infiltrate. No pleural fluid. No pneumothorax. IMPRESSION: Central venous congestion.  No viral pneumonia identified. Electronically Signed   By: Suzy Bouchard M.D.   On: 09/30/2019 19:26   VAS [place] LOWER EXTREMITY VENOUS (DVT)  Result Date: 10/20/2019  Lower Venous DVTStudy Indications: COVID-19 positive, swelling.  Limitations: Poor ultrasound/tissue interface. Comparison Study: No prior study Performing Technologist: Maudry Mayhew MHA, RDMS, RVT, RDCS  Examination Guidelines: A complete evaluation includes B-mode imaging, spectral Doppler, color Doppler, and power Doppler as needed of all accessible portions of each vessel. Bilateral testing is considered an integral part of a complete examination. Limited examinations for reoccurring indications may be performed as noted. The reflux portion of the exam is performed with the patient in reverse Trendelenburg.  +---------+---------------+---------+-----------+----------+--------------+ RIGHT    CompressibilityPhasicitySpontaneityPropertiesThrombus  Aging +---------+---------------+---------+-----------+----------+--------------+ CFV      Full           Yes  Yes                                 +---------+---------------+---------+-----------+----------+--------------+ SFJ      Full                                                        +---------+---------------+---------+-----------+----------+--------------+ FV Prox  Full                                                        +---------+---------------+---------+-----------+----------+--------------+ FV Mid   Full                                                        +---------+---------------+---------+-----------+----------+--------------+ FV DistalFull                                                        +---------+---------------+---------+-----------+----------+--------------+ PFV      Full                                                        +---------+---------------+---------+-----------+----------+--------------+ POP      Full           Yes      Yes                                 +---------+---------------+---------+-----------+----------+--------------+ PTV      Full                                                        +---------+---------------+---------+-----------+----------+--------------+ PERO     Full                                                        +---------+---------------+---------+-----------+----------+--------------+   +---------+---------------+---------+-----------+----------+--------------+ LEFT     CompressibilityPhasicitySpontaneityPropertiesThrombus Aging +---------+---------------+---------+-----------+----------+--------------+ CFV      Full           Yes      Yes                                 +---------+---------------+---------+-----------+----------+--------------+ SFJ      Full                                                         +---------+---------------+---------+-----------+----------+--------------+  FV Prox  Full                                                        +---------+---------------+---------+-----------+----------+--------------+ FV Mid   Full                                                        +---------+---------------+---------+-----------+----------+--------------+ FV DistalFull                                                        +---------+---------------+---------+-----------+----------+--------------+ PFV      Full                                                        +---------+---------------+---------+-----------+----------+--------------+ POP      Full           Yes      Yes                                 +---------+---------------+---------+-----------+----------+--------------+ PTV      Full                                                        +---------+---------------+---------+-----------+----------+--------------+ PERO     Full                                                        +---------+---------------+---------+-----------+----------+--------------+     Summary: RIGHT: - There is no evidence of deep vein thrombosis in the lower extremity.  - No cystic structure found in the popliteal fossa.  LEFT: - There is no evidence of deep vein thrombosis in the lower extremity.  - No cystic structure found in the popliteal fossa.  *See table(s) above for measurements and observations. Electronically signed by Monica Martinez MD on 10/20/2019 at 4:45:53 PM.    Final    I have reviewed this U/S report prior to patient's discharge.  Subjective: No chest pain, some shortness of breath with exertion which is better than at admission. Doesn't need oxygen at rest but wants to take some home. No fevers. Cough is improved with medications.   Discharge Exam: Vitals:   10/20/19 0804 10/20/19 1039  BP: 138/77 (!) 126/94  Pulse: (!) 122 (!) 111  Resp: 18  19  Temp: 99.2 F (37.3 C) 98.1 F (36.7 C)  SpO2: 94% 94%   General: Pt is alert, awake, not in acute  distress Cardiovascular: Regular, S1/S2 +, no rubs, no gallops Respiratory: Nonlabored, full breath sounds throughout. Skin: Left anterior chest dressing c/d/i, minimally tender.   Labs: BNP (last 3 results) Recent Labs    10/08/19 0852 10/09/19 0103 10/19/19 0259  BNP 22.8 16.7 53.6   Basic Metabolic Panel: Recent Labs  Lab 10/16/19 0517 10/17/19 0515 10/18/19 0846 10/19/19 0259 10/20/19 0425  NA 133* 136 138 136 138  K 3.0* 4.0 3.4* 3.2* 3.5  CL 96* 99 101 98 102  CO2 25 25 24 26 28   GLUCOSE 130* 161* 118* 116* 125*  BUN 16 12 15 11 11   CREATININE 0.74 0.80 0.97 0.89 0.93  CALCIUM 8.8* 9.0 9.0 9.0 9.3  MG  --  2.6* 2.4 2.4 2.3  PHOS  --  4.3 3.4 4.4 3.6   Liver Function Tests: Recent Labs  Lab 10/17/19 0515 10/18/19 0846 10/19/19 0259 10/20/19 0425  AST 15 18 13* 17  ALT 22 32 30 43  ALKPHOS 52 57 54 58  BILITOT 1.5* 1.1 1.4* 1.1  PROT 6.9 6.6 6.6 6.6  ALBUMIN 3.6 3.4* 3.6 3.6   No results for input(s): LIPASE, AMYLASE in the last 168 hours. No results for input(s): AMMONIA in the last 168 hours. CBC: Recent Labs  Lab 10/16/19 0517 10/16/19 0517 10/16/19 1844 10/17/19 0515 10/18/19 0846 10/19/19 0259 10/20/19 0425  WBC 17.0*   < > 14.5* 14.6* 13.5* 13.2* 12.9*  NEUTROABS 13.3*  --   --  13.2* 10.0* 9.7* 9.8*  HGB 14.8   < > 15.6* 14.0 14.4 14.1 13.9  HCT 43.1   < > 46.1* 42.2 42.8 41.7 41.8  MCV 82.6   < > 85.1 84.6 85.9 85.6 84.6  PLT 453*   < > 421* 462* 374 381 309   < > = values in this interval not displayed.   Cardiac Enzymes: No results for input(s): CKTOTAL, CKMB, CKMBINDEX, TROPONINI in the last 168 hours. BNP: Invalid input(s): POCBNP CBG: Recent Labs  Lab 10/19/19 1147 10/19/19 1612 10/19/19 2124 10/20/19 0743 10/20/19 1136  GLUCAP 200* 173* 186* 111* 118*   D-Dimer No results for input(s): DDIMER in the last 72  hours. Hgb A1c No results for input(s): HGBA1C in the last 72 hours. Lipid Profile No results for input(s): CHOL, HDL, LDLCALC, TRIG, CHOLHDL, LDLDIRECT in the last 72 hours. Thyroid function studies No results for input(s): TSH, T4TOTAL, T3FREE, THYROIDAB in the last 72 hours.  Invalid input(s): FREET3 Anemia work up No results for input(s): VITAMINB12, FOLATE, FERRITIN, TIBC, IRON, RETICCTPCT in the last 72 hours. Urinalysis    Component Value Date/Time   COLORURINE YELLOW 10/13/2019 1658   APPEARANCEUR CLEAR 10/13/2019 1658   LABSPEC 1.011 10/13/2019 1658   PHURINE 5.0 10/13/2019 1658   GLUCOSEU >=500 (A) 10/13/2019 1658   HGBUR SMALL (A) 10/13/2019 1658   BILIRUBINUR NEGATIVE 10/13/2019 1658   KETONESUR NEGATIVE 10/13/2019 1658   PROTEINUR NEGATIVE 10/13/2019 1658   NITRITE NEGATIVE 10/13/2019 1658   LEUKOCYTESUR NEGATIVE 10/13/2019 1658    Microbiology Recent Results (from the past 240 hour(s))  MRSA PCR Screening     Status: None   Collection Time: 10/18/19  3:00 AM   Specimen: Nasal Mucosa; Nasopharyngeal  Result Value Ref Range Status   MRSA by PCR NEGATIVE NEGATIVE Final    Comment:        The GeneXpert MRSA Assay (FDA approved for NASAL specimens only), is one component of a comprehensive MRSA colonization surveillance program. It is not  intended to diagnose MRSA infection nor to guide or monitor treatment for MRSA infections. Performed at [facility], [place] [address], [place], [place] [address]     Time coordinating discharge: Approximately 40 minutes  Patrecia Pour, MD  [facility] 10/21/2019, 3:40 PM

## 2020-05-05 ENCOUNTER — Other Ambulatory Visit: Payer: Self-pay

## 2020-05-05 ENCOUNTER — Emergency Department (HOSPITAL_BASED_OUTPATIENT_CLINIC_OR_DEPARTMENT_OTHER)
Admission: EM | Admit: 2020-05-05 | Discharge: 2020-05-05 | Disposition: A | Discharge status: Home / Self Care | Payer: BC Managed Care – PPO | Attending: Emergency Medicine | Admitting: Emergency Medicine

## 2020-05-05 ENCOUNTER — Telehealth (HOSPITAL_BASED_OUTPATIENT_CLINIC_OR_DEPARTMENT_OTHER): Payer: Self-pay | Admitting: Emergency Medicine

## 2020-05-05 DIAGNOSIS — H60331 Swimmer's ear, right ear: Secondary | ICD-10-CM | POA: Diagnosis not present

## 2020-05-05 DIAGNOSIS — H9201 Otalgia, right ear: Secondary | ICD-10-CM | POA: Diagnosis present

## 2020-05-05 DIAGNOSIS — Z7951 Long term (current) use of inhaled steroids: Secondary | ICD-10-CM | POA: Diagnosis not present

## 2020-05-05 DIAGNOSIS — E039 Hypothyroidism, unspecified: Secondary | ICD-10-CM | POA: Insufficient documentation

## 2020-05-05 DIAGNOSIS — Z79899 Other long term (current) drug therapy: Secondary | ICD-10-CM | POA: Diagnosis not present

## 2020-05-05 DIAGNOSIS — J45909 Unspecified asthma, uncomplicated: Secondary | ICD-10-CM | POA: Insufficient documentation

## 2020-05-05 LAB — CBG MONITORING, ED: Glucose-Capillary: 99 mg/dL (ref 70–99)

## 2020-05-05 MED ORDER — OXYCODONE-ACETAMINOPHEN 5-325 MG PO TABS: 1.0000 | ORAL_TABLET | Freq: Four times a day (QID) | ORAL | 0 refills | Status: DC | PRN

## 2020-05-05 MED ORDER — OXYCODONE-ACETAMINOPHEN 5-325 MG PO TABS
1.0000 | ORAL_TABLET | Freq: Once | ORAL | Status: AC
  Administered 2020-05-05: 1 via ORAL
  Filled 2020-05-05: qty 1

## 2020-05-05 NOTE — Telephone Encounter (Signed)
Pharmacy reportedly did not receive Rx. Will re-send to same pharmacy.

## 2020-05-05 NOTE — ED Triage Notes (Addendum)
Pt is present to the ED for right ear pain that radiates to her right jaw. Pt was prescribed ear drops at Urgent Care yesterday with no relief of sx.

## 2020-05-05 NOTE — ED Provider Notes (Addendum)
[facility] Provider Note: Sarah Spurling, MD, FACEP  CSN: [id number] MRN: [id number] ARRIVAL: 05/05/20 at 0351 ROOM: MH10/MH10   CHIEF COMPLAINT  Ear Pain   HISTORY OF PRESENT ILLNESS  05/05/20 4:04 AM Sarah Calhoun is a 25 y.o. female with right ear pain for the last 3 days.  Yesterday she was seen at an urgent care and started on amoxicillin 875 mg twice daily, Cortisporin otic 4 times daily and ibuprofen.  She has not gotten any relief.  She rates her pain is an 8 out of 10, aching in nature, and worse with movement of her right ear.  There has been no drainage.  She has not had a fever.   Past Medical History:  Diagnosis Date  . Asthma   . Hypothyroidism     No past surgical history on file.  Family History  Problem Relation Age of Onset  . Hypertension Mother   . Hypertension Father     Social History   Tobacco Use  . Smoking status: Never Smoker  . Smokeless tobacco: Never Used  Substance Use Topics  . Alcohol use: Yes  . Drug use: Never    Prior to Admission medications   Medication Sig Start Date End Date Taking? Authorizing Provider  oxyCODONE-acetaminophen (PERCOCET) 5-325 MG tablet Take 1 tablet by mouth every 6 (six) hours as needed for severe pain. 05/05/20  Yes Damieon Armendariz, MD  albuterol (VENTOLIN HFA) 108 (90 Base) MCG/ACT inhaler Inhale 1-2 puffs into the lungs every 6 (six) hours as needed for wheezing or shortness of breath. 09/30/19   Henderly, Britni A, PA-C  cetirizine (ZYRTEC ALLERGY) 10 MG tablet Take 10 mg by mouth daily as needed for allergies or rhinitis.    [provider]  fluticasone (FLONASE) 50 MCG/ACT nasal spray Place 2 sprays into both nostrils daily. 09/30/19   Henderly, Britni A, PA-C  levothyroxine (SYNTHROID) 200 MCG tablet Take 200 mcg by mouth daily before breakfast.    [provider]  ondansetron (ZOFRAN ODT) 4 MG disintegrating tablet Take 1 tablet (4 mg total) by mouth every 8 (eight) hours as  needed for nausea or vomiting. Patient taking differently: Take 4 mg by mouth every 8 (eight) hours as needed for nausea or vomiting (DISSOLVE ORALLY).  09/30/19   Henderly, Britni A, PA-C  pantoprazole (PROTONIX) 40 MG tablet Take 1 tablet (40 mg total) by mouth 2 (two) times daily for 14 days. 10/15/19 10/29/19  Bonnielee Haff, MD  polyethylene glycol (MIRALAX / GLYCOLAX) 17 g packet Take 17 g by mouth daily as needed. Patient taking differently: Take 17 g by mouth daily as needed for mild constipation.  10/15/19   Bonnielee Haff, MD    Allergies Patient has no known allergies.   REVIEW OF SYSTEMS  Negative except as noted here or in the History of Present Illness.   PHYSICAL EXAMINATION  Initial Vital Signs Blood pressure (!) 149/91, pulse 84, temperature 97.7 F (36.5 C), temperature source Oral, resp. rate (!) 22, height 5\' 5"  (1.651 m), weight 113 kg, SpO2 99 %.  Examination General: Well-developed, obese female in no acute distress; appearance consistent with age of record HENT: normocephalic; atraumatic; left TM obscured by cerumen; right TM normal, right external auditory canal edematous with slight exudate; pain on movement of right external ear Eyes: pupils equal, round and reactive to light; extraocular muscles intact Neck: supple Heart: regular rate and rhythm Lungs: clear to auscultation bilaterally Abdomen: soft; nondistended; nontender; bowel sounds present  Extremities: No deformity; full range of motion Neurologic: Awake, alert and oriented; motor function intact in all extremities and symmetric; no facial droop Skin: Warm and dry Psychiatric: Tearful   RESULTS  Summary of this visit's results, reviewed and interpreted by myself:   EKG Interpretation  Date/Time:    Ventricular Rate:    PR Interval:    QRS Duration:   QT Interval:    QTC Calculation:   R Axis:     Text Interpretation:        Laboratory Studies: Results for orders placed or performed  during the hospital encounter of 05/05/20 (from the past 24 hour(s))  CBG monitoring, ED     Status: None   Collection Time: 05/05/20  4:17 AM  Result Value Ref Range   Glucose-Capillary 99 70 - 99 mg/dL   Imaging Studies: No results found.  ED COURSE and MDM  Nursing notes, initial and subsequent vitals signs, including pulse oximetry, reviewed and interpreted by myself.  Vitals:   05/05/20 0401 05/05/20 0402  BP: (!) 149/91   Pulse: 84   Resp: (!) 22   Temp: 97.7 F (36.5 C)   TempSrc: Oral   SpO2: 99%   Weight:  113 kg  Height:  5\' 5"  (1.651 m)   Medications  oxyCODONE-acetaminophen (PERCOCET/ROXICET) 5-325 MG per tablet 1 tablet (1 tablet Oral Given 05/05/20 0418)    Presentation is consistent with acute otitis externa and the patient has been appropriately treated.  It is too early to call this a treatment failure.  We will add a brief course of narcotic pain medication and anticipate recovery if she continues to take her medications as prescribed.  The patient is not diabetic and her blood sugar is only 99.  I doubt malignant otitis externa at this time.  She was advised to return if symptoms are not improving over the next several days.  PROCEDURES  Procedures   ED DIAGNOSES     ICD-10-CM   1. Acute swimmer's ear of right side  H60.331        Caterine Mcmeans, Jenny Reichmann, MD 05/05/20 0420    Shanon Rosser, MD 05/05/20 [phone]

## 2020-05-07 ENCOUNTER — Other Ambulatory Visit: Payer: Self-pay

## 2020-05-07 ENCOUNTER — Encounter (HOSPITAL_BASED_OUTPATIENT_CLINIC_OR_DEPARTMENT_OTHER): Payer: Self-pay

## 2020-05-07 ENCOUNTER — Emergency Department (HOSPITAL_BASED_OUTPATIENT_CLINIC_OR_DEPARTMENT_OTHER)
Admission: EM | Admit: 2020-05-07 | Discharge: 2020-05-07 | Disposition: A | Discharge status: Home / Self Care | Payer: BC Managed Care – PPO | Attending: Emergency Medicine | Admitting: Emergency Medicine

## 2020-05-07 DIAGNOSIS — E039 Hypothyroidism, unspecified: Secondary | ICD-10-CM | POA: Insufficient documentation

## 2020-05-07 DIAGNOSIS — R6884 Jaw pain: Secondary | ICD-10-CM | POA: Diagnosis not present

## 2020-05-07 DIAGNOSIS — R070 Pain in throat: Secondary | ICD-10-CM | POA: Diagnosis not present

## 2020-05-07 DIAGNOSIS — Z8616 Personal history of COVID-19: Secondary | ICD-10-CM | POA: Insufficient documentation

## 2020-05-07 DIAGNOSIS — H9201 Otalgia, right ear: Secondary | ICD-10-CM | POA: Diagnosis present

## 2020-05-07 DIAGNOSIS — Z79899 Other long term (current) drug therapy: Secondary | ICD-10-CM | POA: Diagnosis not present

## 2020-05-07 DIAGNOSIS — J45909 Unspecified asthma, uncomplicated: Secondary | ICD-10-CM | POA: Insufficient documentation

## 2020-05-07 DIAGNOSIS — Z7951 Long term (current) use of inhaled steroids: Secondary | ICD-10-CM | POA: Diagnosis not present

## 2020-05-07 MED ORDER — AMOXICILLIN-POT CLAVULANATE 875-125 MG PO TABS: 1.0000 | ORAL_TABLET | Freq: Two times a day (BID) | ORAL | 0 refills | Status: DC

## 2020-05-07 NOTE — Discharge Instructions (Addendum)
Today I have switched your antibiotic to a more powerful one.  It is the same base antibiotic, amoxicillin, but has ad addition in it clavulanic acid, which helps to kill more bacteria.  I would recommend that you call your nose and throat tomorrow morning when they open to try and schedule an appointment for evaluation.  Today we placed an ear wick.  This will need to be removed in the next 2 to 3 days.  This can be removed by ear nose and throat, primary care, or back in the emergency room. If you develop fevers, facial swelling, begin feeling poorly, or have any worsening symptoms please seek additional medical care and evaluation.  You may have diarrhea from the antibiotics.  It is very important that you continue to take the antibiotics even if you get diarrhea unless a medical professional tells you that you may stop taking them.  If you stop too early the bacteria you are being treated for will become stronger and you may need different, more powerful antibiotics that have more side effects and worsening diarrhea.  Please stay well hydrated and consider probiotics as they may decrease the severity of your diarrhea.  Please be aware that if you take any hormonal contraception (birth control pills, nexplanon, the ring, etc) that your birth control will not work while you are taking antibiotics and you need to use back up protection as directed on the birth control medication information insert.   Please take Ibuprofen (Advil, motrin) and Tylenol (acetaminophen) to relieve your pain.    You may take up to 600 MG (3 pills) of normal strength ibuprofen every 8 hours as needed.   You make take tylenol, up to 1,000 mg (two extra strength pills) every 8 hours as needed.   It is safe to take ibuprofen and tylenol at the same time as they work differently.   Do not take more than 3,000 mg tylenol in a 24 hour period (not more than one dose every 8 hours.  Please check all medication labels as many  medications such as pain and cold medications may contain tylenol.  Do not drink alcohol while taking these medications.  Do not take other NSAID'S while taking ibuprofen (such as aleve or naproxen).  Please take ibuprofen with food to decrease stomach upset.

## 2020-05-07 NOTE — ED Provider Notes (Incomplete)
[place] EMERGENCY DEPARTMENT Provider Note   CSN: [id number] Arrival date & time: 05/07/20  1959     History Chief Complaint  Patient presents with  . Ear Pain    Australia Mcclees is a 25 y.o. female who presents today for evaluation of ongoing ear pain/infection.  She states that her primary concern today is that she is now having pain in her right sided jaw.  She was originally seen 2 days ago and diagnosed with otitis externa.  She was put on amoxicillin and Corticosporin eardrops. She then states that she has had ongoing pain.  She denies any fevers, and otherwise feels well.  No nausea vomiting or diarrhea.  She states that she has some mild throat pain however is able to swallow without difficulty. She is concerned that her pain has not improved and she is now having pain in the ipsilateral jaw.  HPI     Past Medical History:  Diagnosis Date  . Asthma   . Hypothyroidism     Patient Active Problem List   Diagnosis Date Noted  . Leukocytosis 10/16/2019  . Thrombocytosis 10/16/2019  . Hyperglycemia   . Pneumonia due to COVID-19 virus   . Neck pain, bilateral   . Pneumomediastinum ([place]) 10/10/2019  . Sepsis ([place]) 10/04/2019  . Obesity, Class III, BMI 40-49.9 (morbid obesity) ([place])   . Acute hypoxemic respiratory failure due to COVID-19 ([place])   . Hypothyroidism     History reviewed. No pertinent surgical history.   OB History   No obstetric history on file.     Family History  Problem Relation Age of Onset  . Hypertension Mother   . Hypertension Father     Social History   Tobacco Use  . Smoking status: Never Smoker  . Smokeless tobacco: Never Used  Substance Use Topics  . Alcohol use: Yes  . Drug use: Never    Home Medications Prior to Admission medications   Medication Sig Start Date End Date Taking? Authorizing Provider  amoxicillin-clavulanate (AUGMENTIN) 875-125 MG tablet Take 1 tablet by mouth every 12 (twelve) hours. 05/07/20  Yes  Lorin Glass, PA-C  albuterol (VENTOLIN HFA) 108 (90 Base) MCG/ACT inhaler Inhale 1-2 puffs into the lungs every 6 (six) hours as needed for wheezing or shortness of breath. 09/30/19   Henderly, Britni A, PA-C  cetirizine (ZYRTEC ALLERGY) 10 MG tablet Take 10 mg by mouth daily as needed for allergies or rhinitis.    [provider]  fluticasone (FLONASE) 50 MCG/ACT nasal spray Place 2 sprays into both nostrils daily. 09/30/19   Henderly, Britni A, PA-C  levothyroxine (SYNTHROID) 200 MCG tablet Take 200 mcg by mouth daily before breakfast.    [provider]  ondansetron (ZOFRAN ODT) 4 MG disintegrating tablet Take 1 tablet (4 mg total) by mouth every 8 (eight) hours as needed for nausea or vomiting. Patient taking differently: Take 4 mg by mouth every 8 (eight) hours as needed for nausea or vomiting (DISSOLVE ORALLY).  09/30/19   Henderly, Britni A, PA-C  oxyCODONE-acetaminophen (PERCOCET) 5-325 MG tablet Take 1 tablet by mouth every 6 (six) hours as needed for severe pain. 05/05/20   Sherwood Gambler, MD  pantoprazole (PROTONIX) 40 MG tablet Take 1 tablet (40 mg total) by mouth 2 (two) times daily for 14 days. 10/15/19 10/29/19  Bonnielee Haff, MD  polyethylene glycol (MIRALAX / GLYCOLAX) 17 g packet Take 17 g by mouth daily as needed. Patient taking differently: Take 17 g by mouth  daily as needed for mild constipation.  10/15/19   Bonnielee Haff, MD    Allergies    Patient has no known allergies.  Review of Systems   Review of Systems  Constitutional: Negative for chills, diaphoresis, fatigue and fever.  HENT: Positive for ear pain. Negative for congestion, dental problem, drooling, ear discharge, facial swelling, hearing loss, postnasal drip, rhinorrhea, trouble swallowing and voice change. Sore throat: Jaw pain.   Respiratory: Negative for chest tightness and shortness of breath.   Cardiovascular: Negative for chest pain.  Gastrointestinal: Negative for abdominal pain,  nausea and vomiting.  Musculoskeletal: Negative for neck pain.  Neurological: Negative for weakness and headaches.  All other systems reviewed and are negative.   Physical Exam Updated Vital Signs BP (!) 140/105 (BP Location: Right Arm)   Pulse 89   Temp 98 F (36.7 C) (Oral)   Resp 16   Ht 5\' 5"  (1.651 m)   Wt 128.8 kg   LMP 05/08/2019 Comment: states irregular periods  SpO2 98%   BMI 47.26 kg/m   Physical Exam Vitals and nursing note reviewed.  Constitutional:      General: She is not in acute distress.    Appearance: She is not diaphoretic.  HENT:     Head: Normocephalic and atraumatic.     Ears:     Comments: Right ear with moderate canal edema, unable to clearly view right TM.  There is no pain or discomfort voiced with ear movement during exam. There is no    Mouth/Throat:     Mouth: Mucous membranes are moist.     Pharynx: Oropharynx is clear. No oropharyngeal exudate or posterior oropharyngeal erythema.     Comments: Uvula is midline.  No tonsillar enlargement or exudate. Eyes:     General: No scleral icterus.       Right eye: No discharge.        Left eye: No discharge.     Conjunctiva/sclera: Conjunctivae normal.  Cardiovascular:     Rate and Rhythm: Normal rate and regular rhythm.  Pulmonary:     Effort: Pulmonary effort is normal. No respiratory distress.     Breath sounds: No stridor.  Abdominal:     General: There is no distension.  Musculoskeletal:        General: No deformity.     Cervical back: Normal range of motion.  Skin:    General: Skin is warm and dry.  Neurological:     Mental Status: She is alert.     Motor: No abnormal muscle tone.  Psychiatric:        Behavior: Behavior normal.     ED Results / Procedures / Treatments   Labs (all labs ordered are listed, but only abnormal results are displayed) Labs Reviewed - No data to display  EKG None  Radiology No results found.  Procedures Procedures {Remember to document critical  care time when appropriate:1}  Medications Ordered in ED Medications - No data to display  ED Course  I have reviewed the triage vital signs and the nursing notes.  Pertinent labs & imaging results that were available during my care of the patient were reviewed by me and considered in my medical decision making (see chart for details).    MDM Rules/Calculators/A&P                         Patient is a 25 year old woman who presents today for evaluation of recheck of right  ear pain.  She is concerned because she is now having pain in her jaw on the right side additionally.  When I see her she is frequently pressing on the area anterior to her ear and on the ear itself. Clinically she is afebrile, not tachycardic or tachypneic and generally feels well otherwise. I discussed with her what additional evaluation for spread of infection would look like, including lab work and CAT scan. At this point she made the informed decision to refuse these things. Given that she is otherwise well without fevers I feel this is reasonable. Additionally I suspect that with her rubbing on her face and ear she may be irritating the facial sensory nerves and that may be why she is having pain radiating along her jaw.  We discussed this and the need to stop this behavior to evaluate for improvement and she states her understanding. Given the degree of her canal edema ear wick is placed. I recommended that she call ENT for an appointment.  She is aware that the airways needs to be removed in 2 to 3 days either back in the emergency room or by ENT. Additionally we will switch her antibiotic to Augmentin from amoxicillin. Consideration for ciprofloxacin was given, however given that she had within the past 6 months pneumomediastinum and pneumothorax from coughing in the setting of a Covid infection I hesitate to start this given the possible risk of connective tissue complications especially in the lack of  Clear  worsening/failure/spread of infection.   Return precautions were discussed with patient who states their understanding.  At the time of discharge patient denied any unaddressed complaints or concerns.  Patient is agreeable for discharge home.  Note: Portions of this report may have been transcribed using voice recognition software. Every effort was made to ensure accuracy; however, inadvertent computerized transcription errors may be present   Final Clinical Impression(s) / ED Diagnoses Final diagnoses:  Right ear pain    Rx / DC Orders ED Discharge Orders         Ordered    amoxicillin-clavulanate (AUGMENTIN) 875-125 MG tablet  Every 12 hours        05/07/20 2058

## 2020-05-07 NOTE — ED Provider Notes (Signed)
[facility] EMERGENCY DEPARTMENT Provider Note   CSN: [id number] Arrival date & time: 05/07/20  1959     History Chief Complaint  Patient presents with  . Ear Pain    Sarah Calhoun is a 25 y.o. female who presents today for evaluation of ongoing ear pain/infection.  She states that her primary concern today is that she is now having pain in her right sided jaw.  She was originally seen 2 days ago and diagnosed with otitis externa.  She was put on amoxicillin and Corticosporin eardrops. She then states that she has had ongoing pain.  She denies any fevers, and otherwise feels well.  No nausea vomiting or diarrhea.  She states that she has some mild throat pain however is able to swallow without difficulty. She is concerned that her pain has not improved and she is now having pain in the ipsilateral jaw.  HPI     Past Medical History:  Diagnosis Date  . Asthma   . Hypothyroidism     Patient Active Problem List   Diagnosis Date Noted  . Leukocytosis 10/16/2019  . Thrombocytosis 10/16/2019  . Hyperglycemia   . Pneumonia due to COVID-19 virus   . Neck pain, bilateral   . Pneumomediastinum ([place]) 10/10/2019  . Sepsis ([place]) 10/04/2019  . Obesity, Class III, BMI 40-49.9 (morbid obesity) ([place])   . Acute hypoxemic respiratory failure due to COVID-19 ([place])   . Hypothyroidism     History reviewed. No pertinent surgical history.   OB History   No obstetric history on file.     Family History  Problem Relation Age of Onset  . Hypertension Mother   . Hypertension Father     Social History   Tobacco Use  . Smoking status: Never Smoker  . Smokeless tobacco: Never Used  Substance Use Topics  . Alcohol use: Yes  . Drug use: Never    Home Medications Prior to Admission medications   Medication Sig Start Date End Date Taking? Authorizing Provider  amoxicillin-clavulanate (AUGMENTIN) 875-125 MG tablet Take 1 tablet by mouth every 12 (twelve) hours. 05/07/20  Yes  Lorin Glass, PA-C  albuterol (VENTOLIN HFA) 108 (90 Base) MCG/ACT inhaler Inhale 1-2 puffs into the lungs every 6 (six) hours as needed for wheezing or shortness of breath. 09/30/19   Henderly, Britni A, PA-C  cetirizine (ZYRTEC ALLERGY) 10 MG tablet Take 10 mg by mouth daily as needed for allergies or rhinitis.    [provider]  fluticasone (FLONASE) 50 MCG/ACT nasal spray Place 2 sprays into both nostrils daily. 09/30/19   Henderly, Britni A, PA-C  levothyroxine (SYNTHROID) 200 MCG tablet Take 200 mcg by mouth daily before breakfast.    [provider]  ondansetron (ZOFRAN ODT) 4 MG disintegrating tablet Take 1 tablet (4 mg total) by mouth every 8 (eight) hours as needed for nausea or vomiting. Patient taking differently: Take 4 mg by mouth every 8 (eight) hours as needed for nausea or vomiting (DISSOLVE ORALLY).  09/30/19   Henderly, Britni A, PA-C  oxyCODONE-acetaminophen (PERCOCET) 5-325 MG tablet Take 1 tablet by mouth every 6 (six) hours as needed for severe pain. 05/05/20   Sherwood Gambler, MD  pantoprazole (PROTONIX) 40 MG tablet Take 1 tablet (40 mg total) by mouth 2 (two) times daily for 14 days. 10/15/19 10/29/19  Bonnielee Haff, MD  polyethylene glycol (MIRALAX / GLYCOLAX) 17 g packet Take 17 g by mouth daily as needed. Patient taking differently: Take 17 g by mouth  daily as needed for mild constipation.  10/15/19   Bonnielee Haff, MD    Allergies    Patient has no known allergies.  Review of Systems   Review of Systems  Constitutional: Negative for chills, diaphoresis, fatigue and fever.  HENT: Positive for ear pain. Negative for congestion, dental problem, drooling, ear discharge, facial swelling, hearing loss, postnasal drip, rhinorrhea, trouble swallowing and voice change. Sore throat: Jaw pain.   Respiratory: Negative for chest tightness and shortness of breath.   Cardiovascular: Negative for chest pain.  Gastrointestinal: Negative for abdominal pain,  nausea and vomiting.  Musculoskeletal: Negative for neck pain.  Neurological: Negative for weakness and headaches.  All other systems reviewed and are negative.   Physical Exam Updated Vital Signs BP (!) 140/105 (BP Location: Right Arm)   Pulse 89   Temp 98 F (36.7 C) (Oral)   Resp 16   Ht 5\' 5"  (1.651 m)   Wt 128.8 kg   LMP 05/08/2019 Comment: states irregular periods  SpO2 98%   BMI 47.26 kg/m   Physical Exam Vitals and nursing note reviewed.  Constitutional:      General: She is not in acute distress.    Appearance: She is not diaphoretic.  HENT:     Head: Normocephalic and atraumatic.     Ears:     Comments: Right ear with moderate canal edema, unable to clearly view right TM.  There is no pain or discomfort voiced with ear movement during exam. There is no obvious drainage. No edema around the ear.       Mouth/Throat:     Mouth: Mucous membranes are moist.     Pharynx: Oropharynx is clear. No oropharyngeal exudate or posterior oropharyngeal erythema.     Comments: Uvula is midline.  No tonsillar enlargement or exudate. Eyes:     General: No scleral icterus.       Right eye: No discharge.        Left eye: No discharge.     Conjunctiva/sclera: Conjunctivae normal.  Cardiovascular:     Rate and Rhythm: Normal rate and regular rhythm.  Pulmonary:     Effort: Pulmonary effort is normal. No respiratory distress.     Breath sounds: No stridor.  Abdominal:     General: There is no distension.  Musculoskeletal:        General: No deformity.     Cervical back: Normal range of motion.  Lymphadenopathy:     Cervical: No cervical adenopathy.  Skin:    General: Skin is warm and dry.  Neurological:     General: No focal deficit present.     Mental Status: She is alert.     Cranial Nerves: No cranial nerve deficit.     Motor: No abnormal muscle tone.  Psychiatric:        Behavior: Behavior normal.     ED Results / Procedures / Treatments   Labs (all labs  ordered are listed, but only abnormal results are displayed) Labs Reviewed - No data to display  EKG None  Radiology No results found.  Procedures Procedures   Medications Ordered in ED Medications - No data to display  ED Course  I have reviewed the triage vital signs and the nursing notes.  Pertinent labs & imaging results that were available during my care of the patient were reviewed by me and considered in my medical decision making (see chart for details).    MDM Rules/Calculators/A&P  Patient is a 25 year old woman who presents today for evaluation of recheck of right ear pain.  She is concerned because she is now having pain in her jaw on the right side additionally.  When I see her she is frequently pressing on the area anterior to her ear and on the ear itself. Clinically she is afebrile, not tachycardic or tachypneic and generally feels well otherwise.  I discussed with her what additional evaluation for spread of infection would look like, including lab work and CAT scan. At this point she made the informed decision to refuse these things. Given that she is otherwise well without fevers I feel this is reasonable. Additionally I suspect that with her rubbing on her face and ear she may be irritating the facial sensory nerves and that may be why she is having pain radiating along her jaw.  We discussed this and the need to stop this behavior to evaluate for improvement and she states her understanding. Given the degree of her canal edema ear wick is placed. I recommended that she call ENT for an appointment.  She is aware that the airways needs to be removed in 2 to 3 days either back in the emergency room or by ENT.  Additionally we will switch her antibiotic to Augmentin from amoxicillin. Consideration for ciprofloxacin was given, however given that she had within the past 6 months pneumomediastinum and pneumothorax from coughing in the setting of  a Covid infection I hesitate to start this given the possible risk of connective tissue complications especially in the lack of  Clear worsening/failure/spread of infection.   Return precautions were discussed with patient who states their understanding.  At the time of discharge patient denied any unaddressed complaints or concerns.  Patient is agreeable for discharge home.  Note: Portions of this report may have been transcribed using voice recognition software. Every effort was made to ensure accuracy; however, inadvertent computerized transcription errors may be present   Final Clinical Impression(s) / ED Diagnoses Final diagnoses:  Right ear pain    Rx / DC Orders ED Discharge Orders         Ordered    amoxicillin-clavulanate (AUGMENTIN) 875-125 MG tablet  Every 12 hours        05/07/20 2058           Lorin Glass, PA-C 05/08/20 Wonda Amis, MD 05/08/20 2348

## 2020-05-09 ENCOUNTER — Other Ambulatory Visit: Payer: Self-pay

## 2020-05-09 ENCOUNTER — Encounter (HOSPITAL_BASED_OUTPATIENT_CLINIC_OR_DEPARTMENT_OTHER): Payer: Self-pay

## 2020-05-09 ENCOUNTER — Emergency Department (HOSPITAL_BASED_OUTPATIENT_CLINIC_OR_DEPARTMENT_OTHER)
Admission: EM | Admit: 2020-05-09 | Discharge: 2020-05-09 | Disposition: A | Discharge status: Home / Self Care | Payer: BC Managed Care – PPO | Attending: Emergency Medicine | Admitting: Emergency Medicine

## 2020-05-09 DIAGNOSIS — Z7951 Long term (current) use of inhaled steroids: Secondary | ICD-10-CM | POA: Diagnosis not present

## 2020-05-09 DIAGNOSIS — H6091 Unspecified otitis externa, right ear: Secondary | ICD-10-CM

## 2020-05-09 DIAGNOSIS — Z79899 Other long term (current) drug therapy: Secondary | ICD-10-CM | POA: Insufficient documentation

## 2020-05-09 DIAGNOSIS — E039 Hypothyroidism, unspecified: Secondary | ICD-10-CM | POA: Insufficient documentation

## 2020-05-09 DIAGNOSIS — J45909 Unspecified asthma, uncomplicated: Secondary | ICD-10-CM | POA: Diagnosis not present

## 2020-05-09 DIAGNOSIS — H6061 Unspecified chronic otitis externa, right ear: Secondary | ICD-10-CM | POA: Diagnosis not present

## 2020-05-09 DIAGNOSIS — Z8616 Personal history of COVID-19: Secondary | ICD-10-CM | POA: Diagnosis not present

## 2020-05-09 DIAGNOSIS — H9201 Otalgia, right ear: Secondary | ICD-10-CM | POA: Diagnosis present

## 2020-05-09 NOTE — ED Triage Notes (Signed)
Patient had earwick placed in right ear 2 days ago here, told to return to have it taken out if she could not get in to see and ENT, pain has improved, no other complaints.

## 2020-05-09 NOTE — ED Provider Notes (Signed)
[facility] EMERGENCY DEPARTMENT Provider Note   CSN: [id number] Arrival date & time: 05/09/20  1811     History Chief Complaint  Patient presents with  . ear wick removal    Sarah Calhoun is a 25 y.o. female.  Pt here for earwick removal.  Pt reports she had placed 2 days ago. Pt reports symptoms have improved.   The history is provided by the patient. No language interpreter was used.       Past Medical History:  Diagnosis Date  . Asthma   . Hypothyroidism     Patient Active Problem List   Diagnosis Date Noted  . Leukocytosis 10/16/2019  . Thrombocytosis 10/16/2019  . Hyperglycemia   . Pneumonia due to COVID-19 virus   . Neck pain, bilateral   . Pneumomediastinum ([place]) 10/10/2019  . Sepsis ([place]) 10/04/2019  . Obesity, Class III, BMI 40-49.9 (morbid obesity) ([place])   . Acute hypoxemic respiratory failure due to COVID-19 ([place])   . Hypothyroidism     History reviewed. No pertinent surgical history.   OB History   No obstetric history on file.     Family History  Problem Relation Age of Onset  . Hypertension Mother   . Hypertension Father     Social History   Tobacco Use  . Smoking status: Never Smoker  . Smokeless tobacco: Never Used  Substance Use Topics  . Alcohol use: Yes  . Drug use: Never    Home Medications Prior to Admission medications   Medication Sig Start Date End Date Taking? Authorizing Provider  albuterol (VENTOLIN HFA) 108 (90 Base) MCG/ACT inhaler Inhale 1-2 puffs into the lungs every 6 (six) hours as needed for wheezing or shortness of breath. 09/30/19   Henderly, Britni A, PA-C  amoxicillin-clavulanate (AUGMENTIN) 875-125 MG tablet Take 1 tablet by mouth every 12 (twelve) hours. 05/07/20   Lorin Glass, PA-C  cetirizine (ZYRTEC ALLERGY) 10 MG tablet Take 10 mg by mouth daily as needed for allergies or rhinitis.    [provider]  fluticasone (FLONASE) 50 MCG/ACT nasal spray Place 2 sprays into both  nostrils daily. 09/30/19   Henderly, Britni A, PA-C  levothyroxine (SYNTHROID) 200 MCG tablet Take 200 mcg by mouth daily before breakfast.    [provider]  ondansetron (ZOFRAN ODT) 4 MG disintegrating tablet Take 1 tablet (4 mg total) by mouth every 8 (eight) hours as needed for nausea or vomiting. Patient taking differently: Take 4 mg by mouth every 8 (eight) hours as needed for nausea or vomiting (DISSOLVE ORALLY).  09/30/19   Henderly, Britni A, PA-C  oxyCODONE-acetaminophen (PERCOCET) 5-325 MG tablet Take 1 tablet by mouth every 6 (six) hours as needed for severe pain. 05/05/20   Sherwood Gambler, MD  pantoprazole (PROTONIX) 40 MG tablet Take 1 tablet (40 mg total) by mouth 2 (two) times daily for 14 days. 10/15/19 10/29/19  Bonnielee Haff, MD  polyethylene glycol (MIRALAX / GLYCOLAX) 17 g packet Take 17 g by mouth daily as needed. Patient taking differently: Take 17 g by mouth daily as needed for mild constipation.  10/15/19   Bonnielee Haff, MD    Allergies    Patient has no known allergies.  Review of Systems   Review of Systems  All other systems reviewed and are negative.   Physical Exam Updated Vital Signs BP (!) 145/80 (BP Location: Left Arm)   Pulse 65   Temp (!) 97.5 F (36.4 C) (Oral)   Resp 18   Ht  5\' 5"  (1.651 m)   Wt 128.8 kg   SpO2 99%   BMI 47.26 kg/m   Physical Exam Vitals reviewed.  Constitutional:      Appearance: Normal appearance.  HENT:     Ears:     Comments: Ear wick removed from right ear Skin:    General: Skin is warm.  Neurological:     General: No focal deficit present.     Mental Status: She is alert.  Psychiatric:        Mood and Affect: Mood normal.     ED Results / Procedures / Treatments   Labs (all labs ordered are listed, but only abnormal results are displayed) Labs Reviewed - No data to display  EKG None  Radiology No results found.  Procedures Procedures   Medications Ordered in ED Medications - No data to  display  ED Course  I have reviewed the triage vital signs and the nursing notes.  Pertinent labs & imaging results that were available during my care of the patient were reviewed by me and considered in my medical decision making (see chart for details).    MDM Rules/Calculators/A&P                           Final Clinical Impression(s) / ED Diagnoses Final diagnoses:  Otitis externa of right ear, unspecified chronicity, unspecified type    Rx / DC Orders ED Discharge Orders    None    An After Visit Summary was printed and given to the patient.   Sidney Ace 05/09/20 1914    Tegeler, Gwenyth Allegra, MD 05/09/20 [phone]

## 2020-05-09 NOTE — Discharge Instructions (Signed)
Continue drops.  Return if any problems.

## 2021-04-11 IMAGING — CT CT CHEST W/O CM
2 of 3 series · 15 of 36 positions shown, 18 images · non-contrast
Comparison: CT neck 10/09/2019

CLINICAL DATA: Pneumomediastinum.  Possible esophageal perforation.

EXAM:
CT NECK AND CHEST WITHOUT CONTRAST

[Series 7: thorax · axial · 0.69mm/px · z∈[-672,-428]mm · 12 of 144 slices shown, 15 images]
[im 11/144  mediastinal]
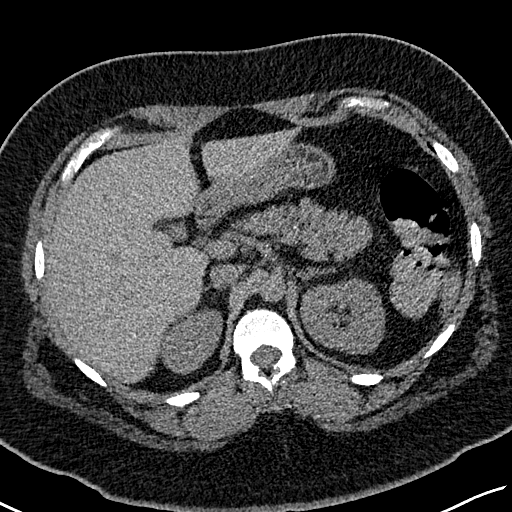
[im 11/144  lung]
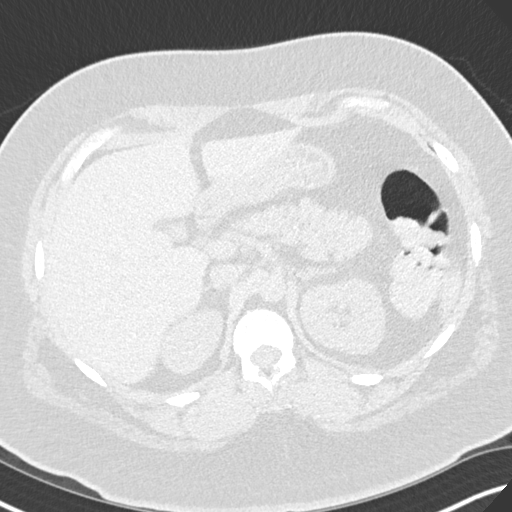
[im 22/144  lung]
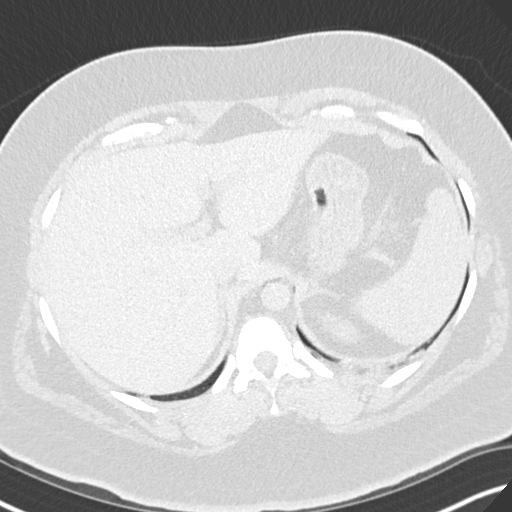
[im 32/144  lung]
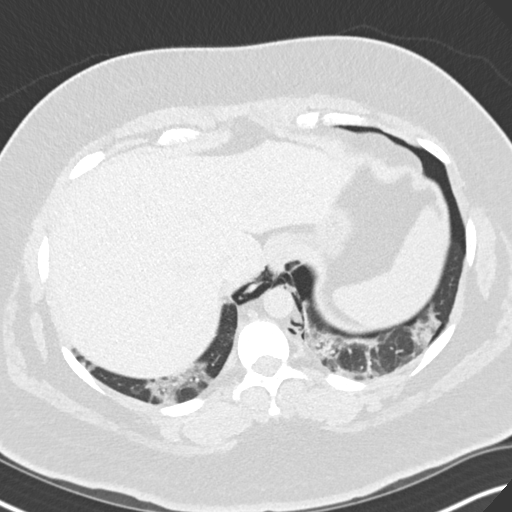
[im 43/144  lung]
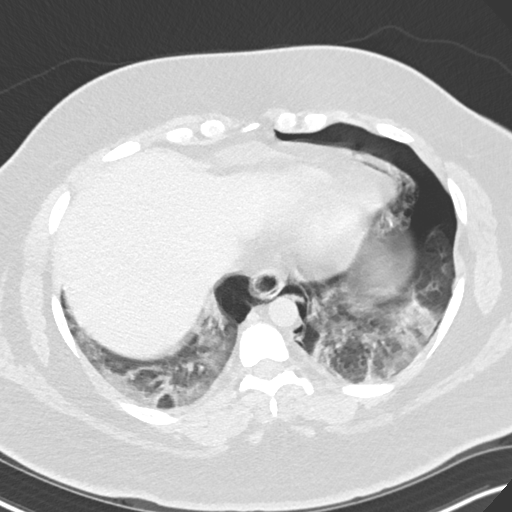
[im 53/144  mediastinal]
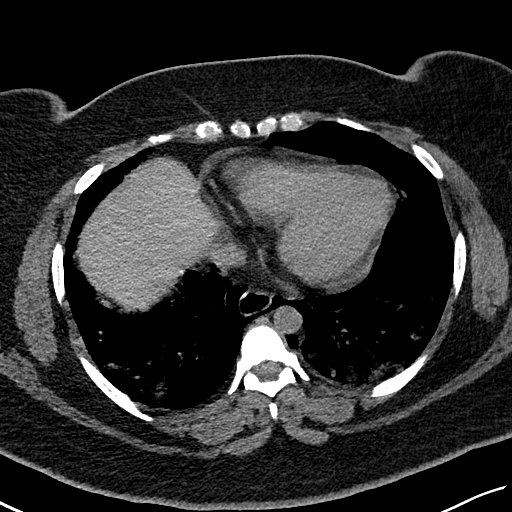
[im 53/144  lung]
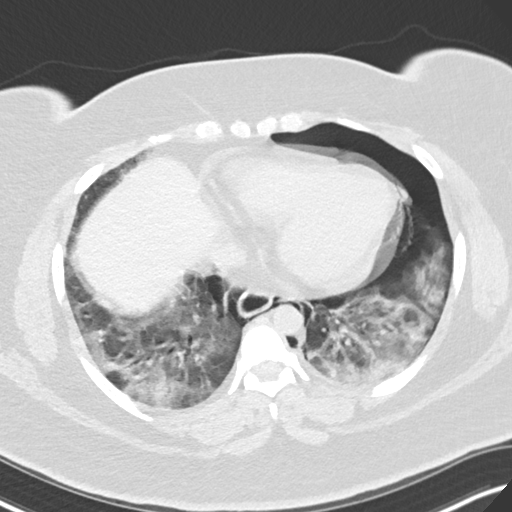
[im 64/144  lung]
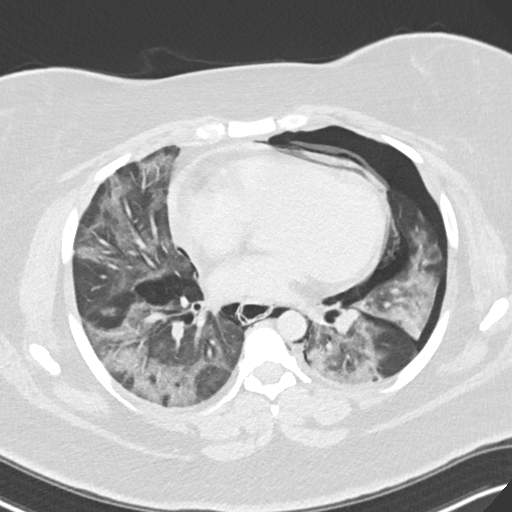
[im 80/144  lung]
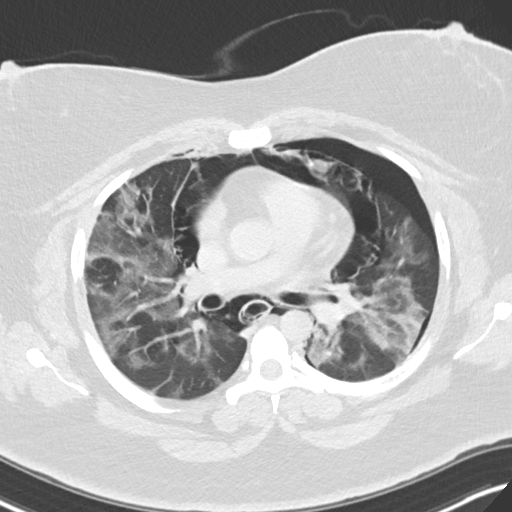
[im 91/144  lung]
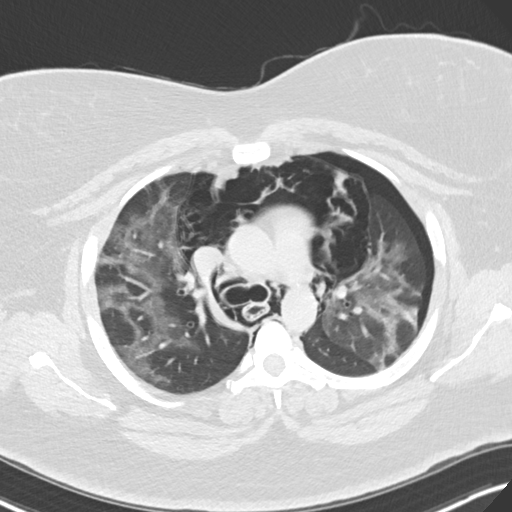
[im 101/144  mediastinal]
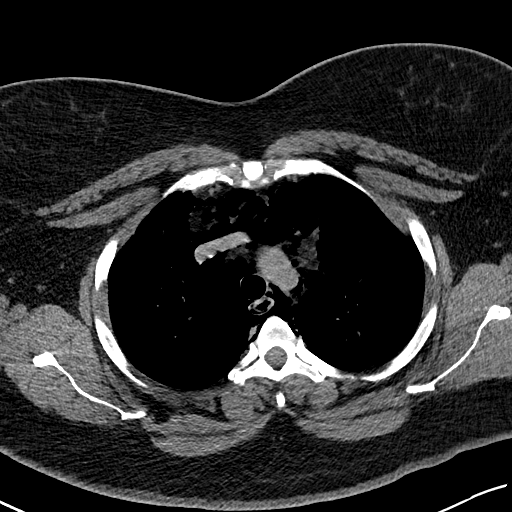
[im 101/144  lung]
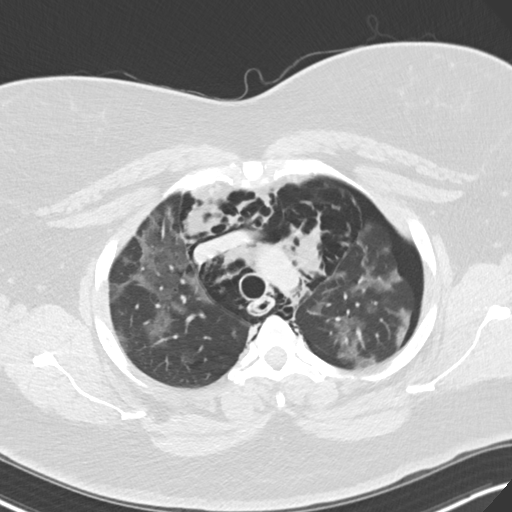
[im 112/144  lung]
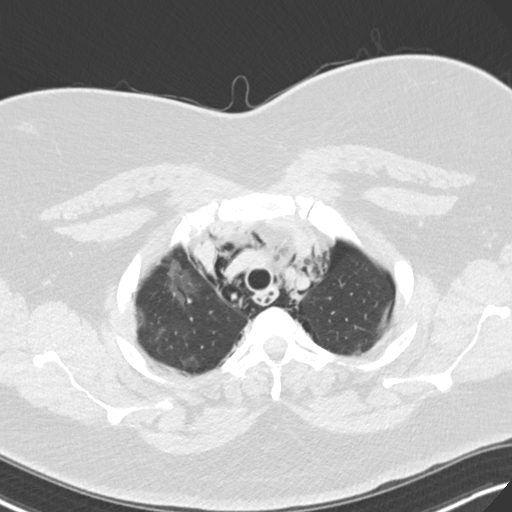
[im 122/144  lung]
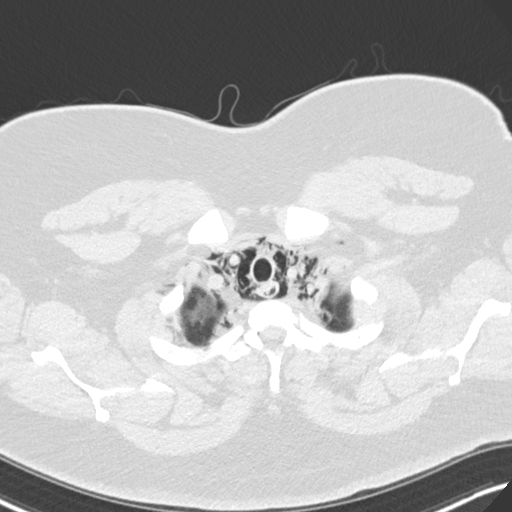
[im 133/144  lung]
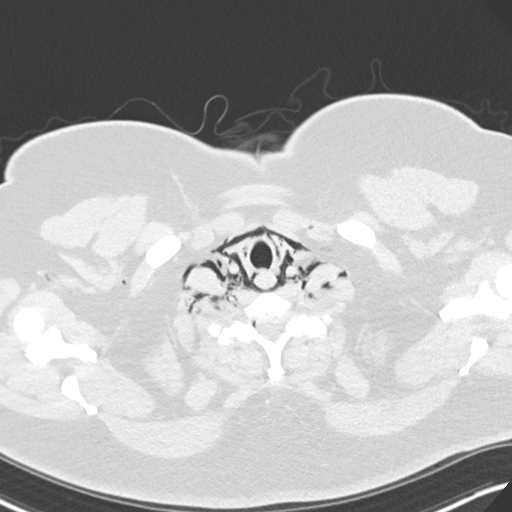

[Series 10: coronal · coronal · 0.59mm/px · 3 of 151 slices shown]
[im 31/151  lung]
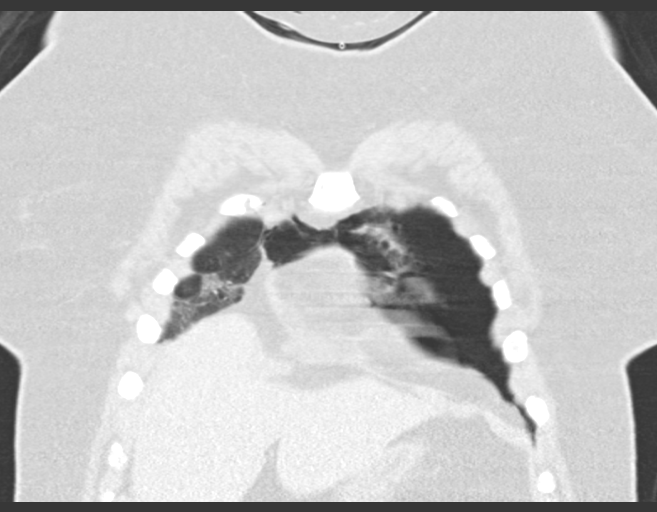
[im 61/151  lung]
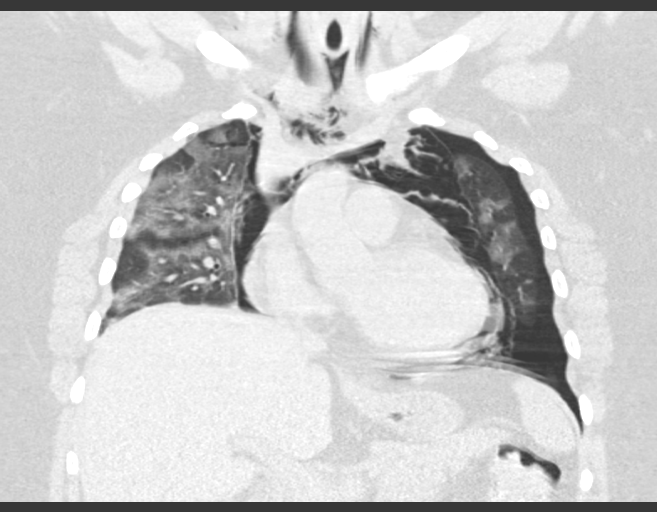
[im 91/151  lung]
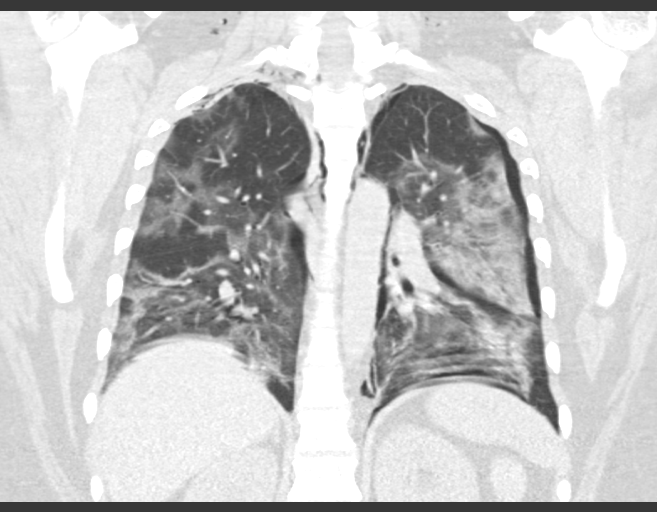

[15 of 36 positions shown; findings below may reference images not displayed]

FINDINGS: CT NECK FINDINGS

Pharynx and larynx: The pharynx and larynx are normal. There is
dissecting emphysema extending through the prevertebral,
parapharyngeal, carotid spaces. The amount of air has increased from
the prior study.

Salivary glands: No inflammation, mass, or stone.

Thyroid: Normal.

Lymph nodes: None enlarged or abnormal density.

Vascular: Negative.

Limited intracranial: Normal

Visualized orbits: Normal

Mastoids and visualized paranasal sinuses: Negative

Skeleton: No acute or aggressive process.

CT CHEST FINDINGS

Cardiovascular: No significant vascular findings. Normal heart size.
No pericardial effusion.

Mediastinum/Nodes: Large amount of pneumomediastinum.

Lungs/Pleura: Intermediate sized left pneumothorax. Widespread hazy
airspace opacities.

Upper Abdomen: No acute abnormality.

Musculoskeletal: No chest wall mass or suspicious bone lesions
identified.
IMPRESSION: 1. Worsened large volume pneumomediastinum with emphysema extending
throughout the deep spaces of the neck.
2. Large left pneumothorax.
3. Widespread multifocal pneumonia.

## 2021-04-12 IMAGING — DX DG CHEST 1V PORT
1 series · 1 of 1 positions shown · non-contrast
Comparison: October 16, 2019.

CLINICAL DATA: [id number] positive.

EXAM:
PORTABLE CHEST 1 VIEW

[chest]
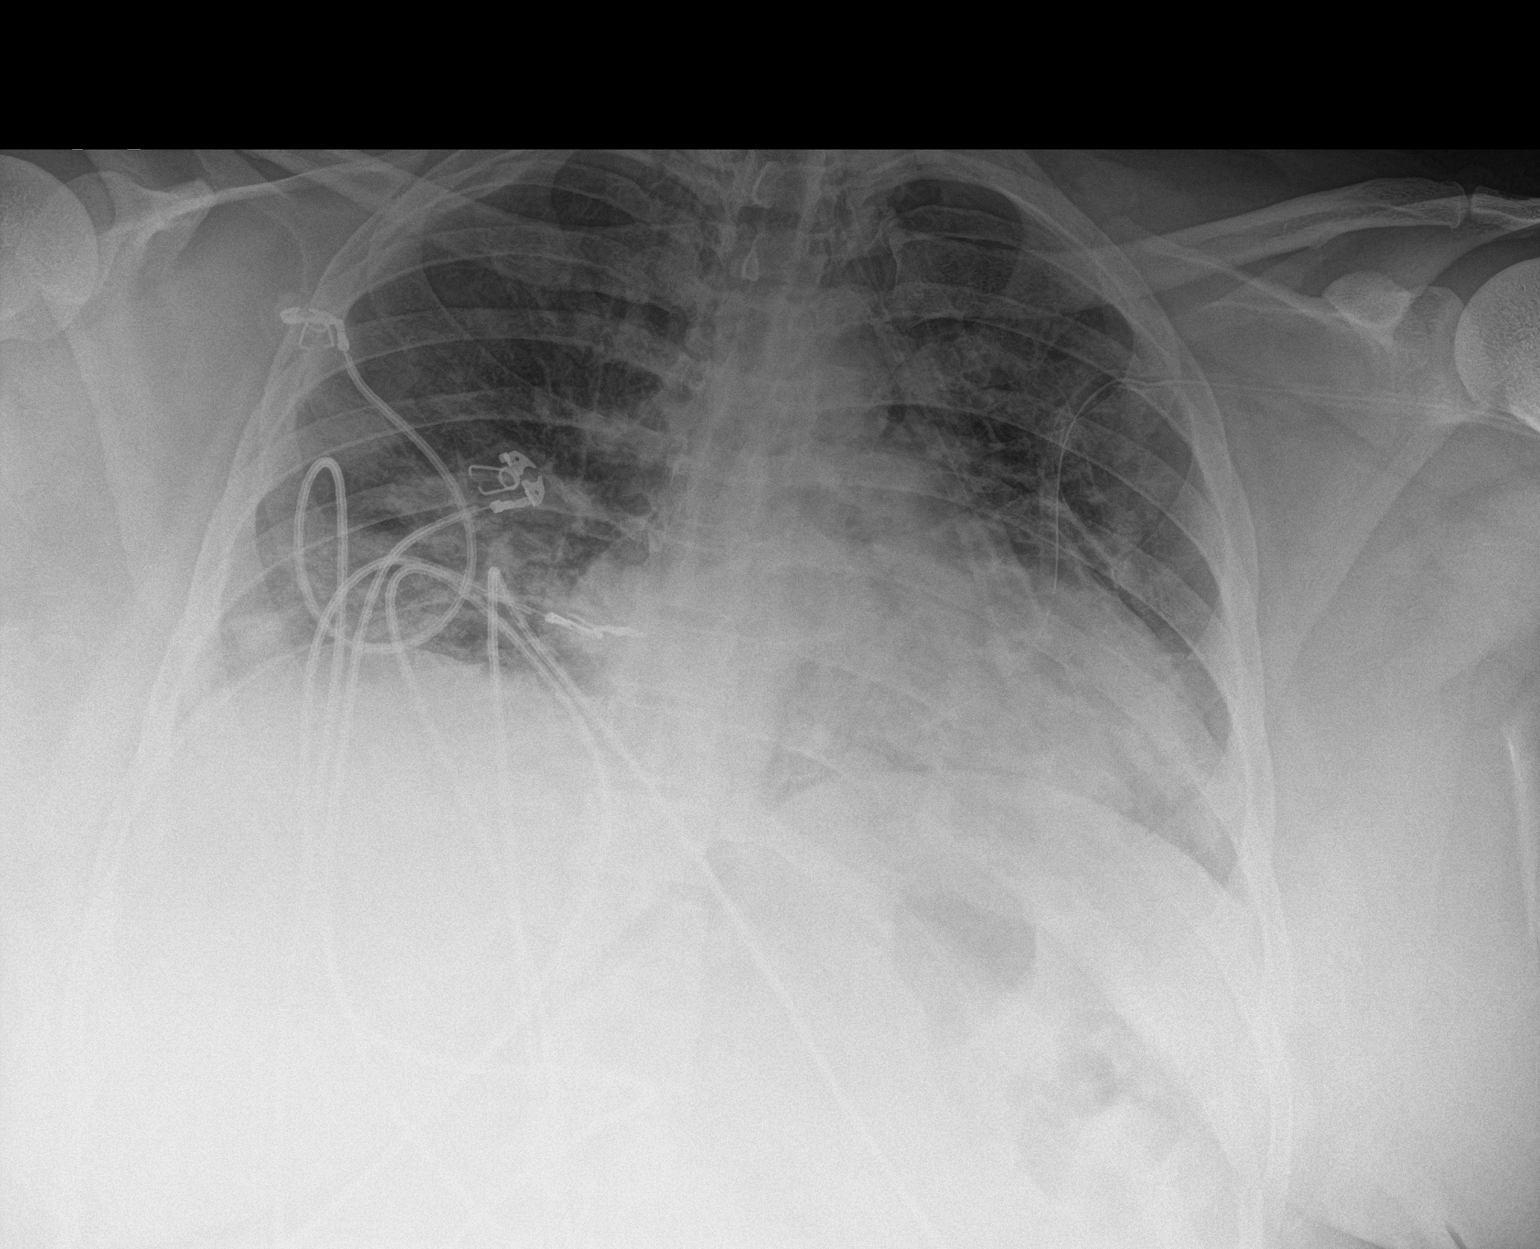

[1 of 1 positions shown; findings below may reference images not displayed]

FINDINGS: Stable cardiomediastinal silhouette. Stable patchy airspace
opacities are noted bilaterally consistent with multifocal
pneumonia. Stable position of left-sided chest tube. No definite
pneumothorax or pleural effusion is noted. Bony thorax is
unremarkable.
IMPRESSION: Stable bilateral multifocal pneumonia. Stable position of left-sided
chest tube. No definite pneumothorax is noted.

## 2021-04-12 IMAGING — DX DG CHEST 1V PORT
1 series · 1 of 1 positions shown · non-contrast
Comparison: [DATE] a.m.

CLINICAL DATA: Left pneumothorax

EXAM:
PORTABLE CHEST 1 VIEW

[chest ap]
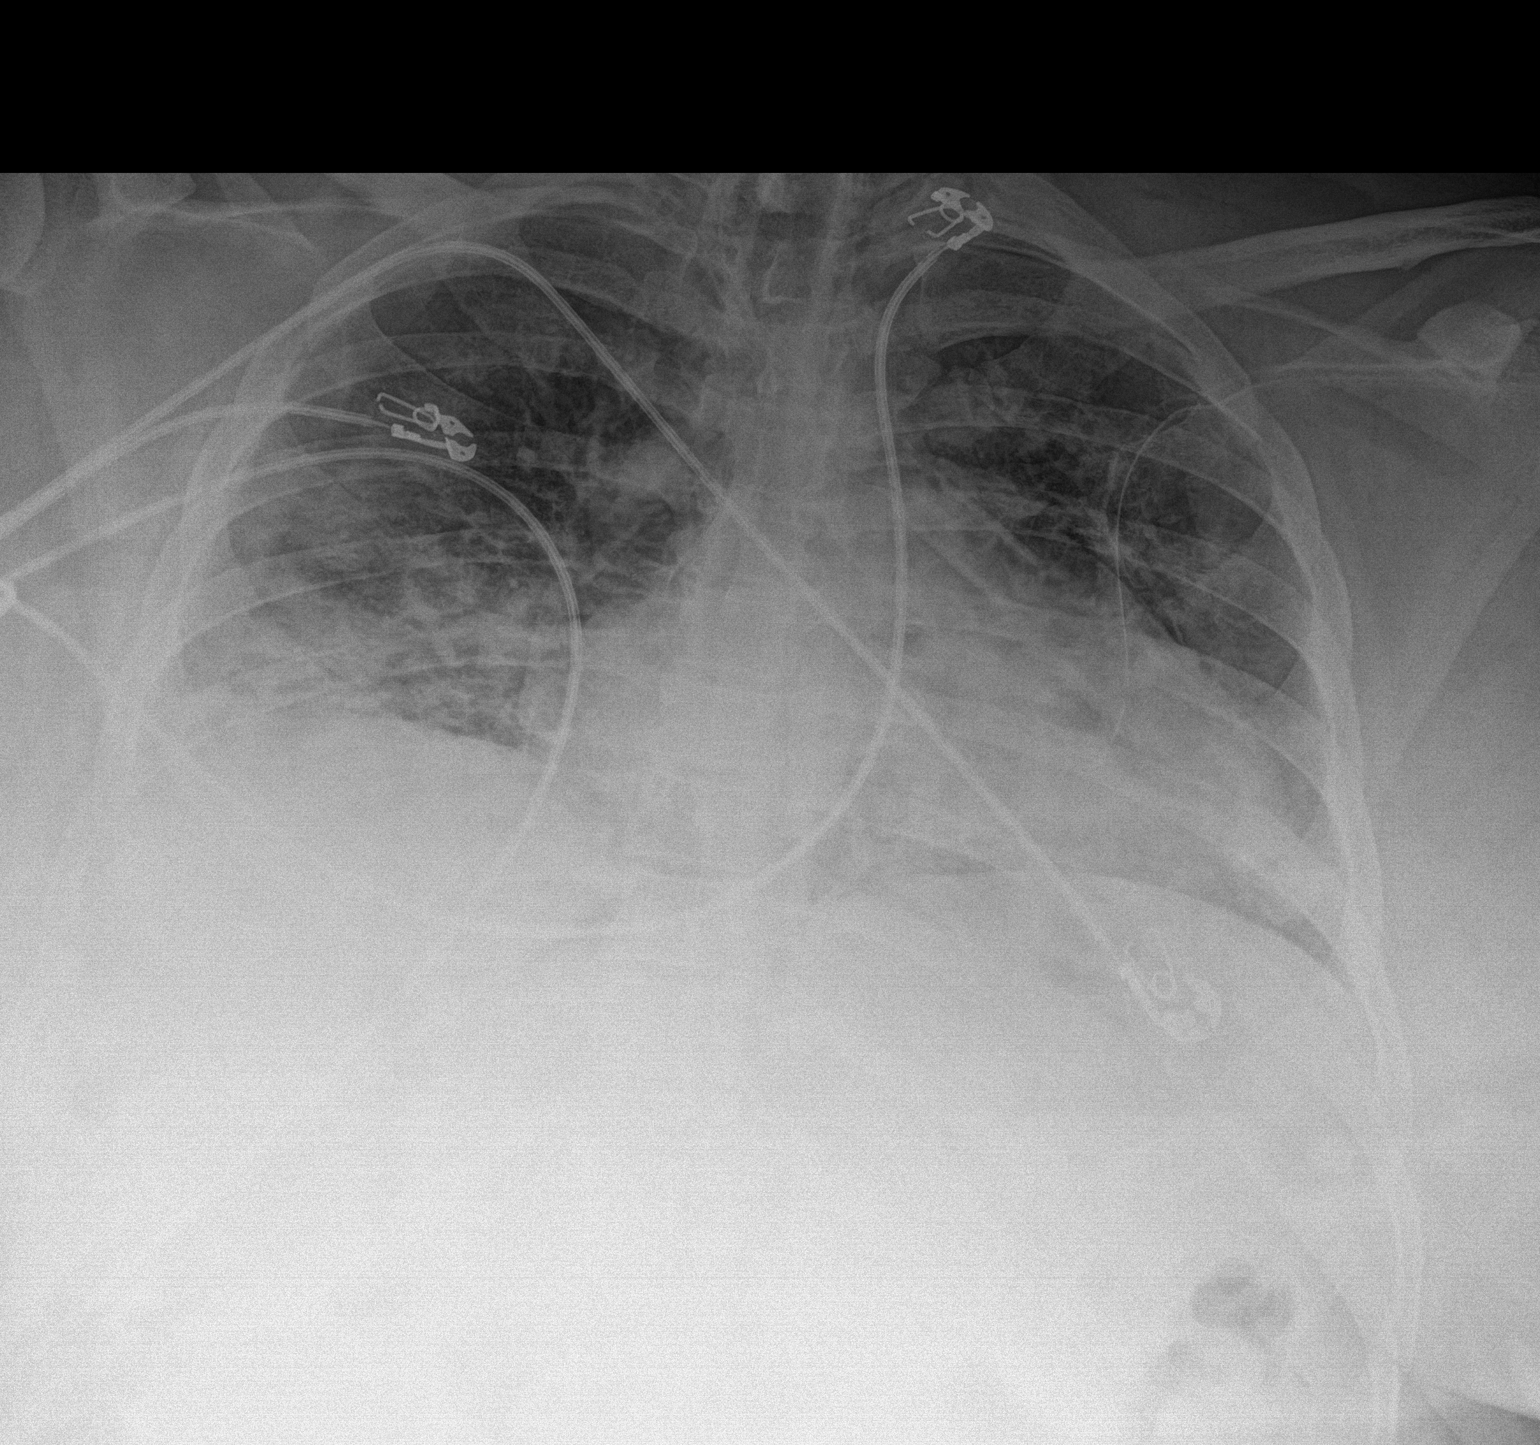

[1 of 1 positions shown; findings below may reference images not displayed]

FINDINGS: Lung volumes are small and pulmonary insufflation has decreased
since prior examination. Left chest tube is unchanged. No
pneumothorax or pleural effusion. Multifocal pulmonary infiltrates
are again identified, progressive at the right lung base and within
the left upper lobe. Cardiac size within normal limits. No acute
bone abnormality.
IMPRESSION: 1. Diminished lung volumes with progressive multifocal pulmonary
infiltrates.
2. Stable left chest tube. No pneumothorax.

## 2021-04-15 IMAGING — DX DG CHEST 1V PORT
1 series · 1 of 1 positions shown · non-contrast
Comparison: 10/19/2019

CLINICAL DATA: History of pneumothorax and pneumomediastinum. Post
chest tube placement, now discontinued.

EXAM:
PORTABLE CHEST 1 VIEW

[chest ap]
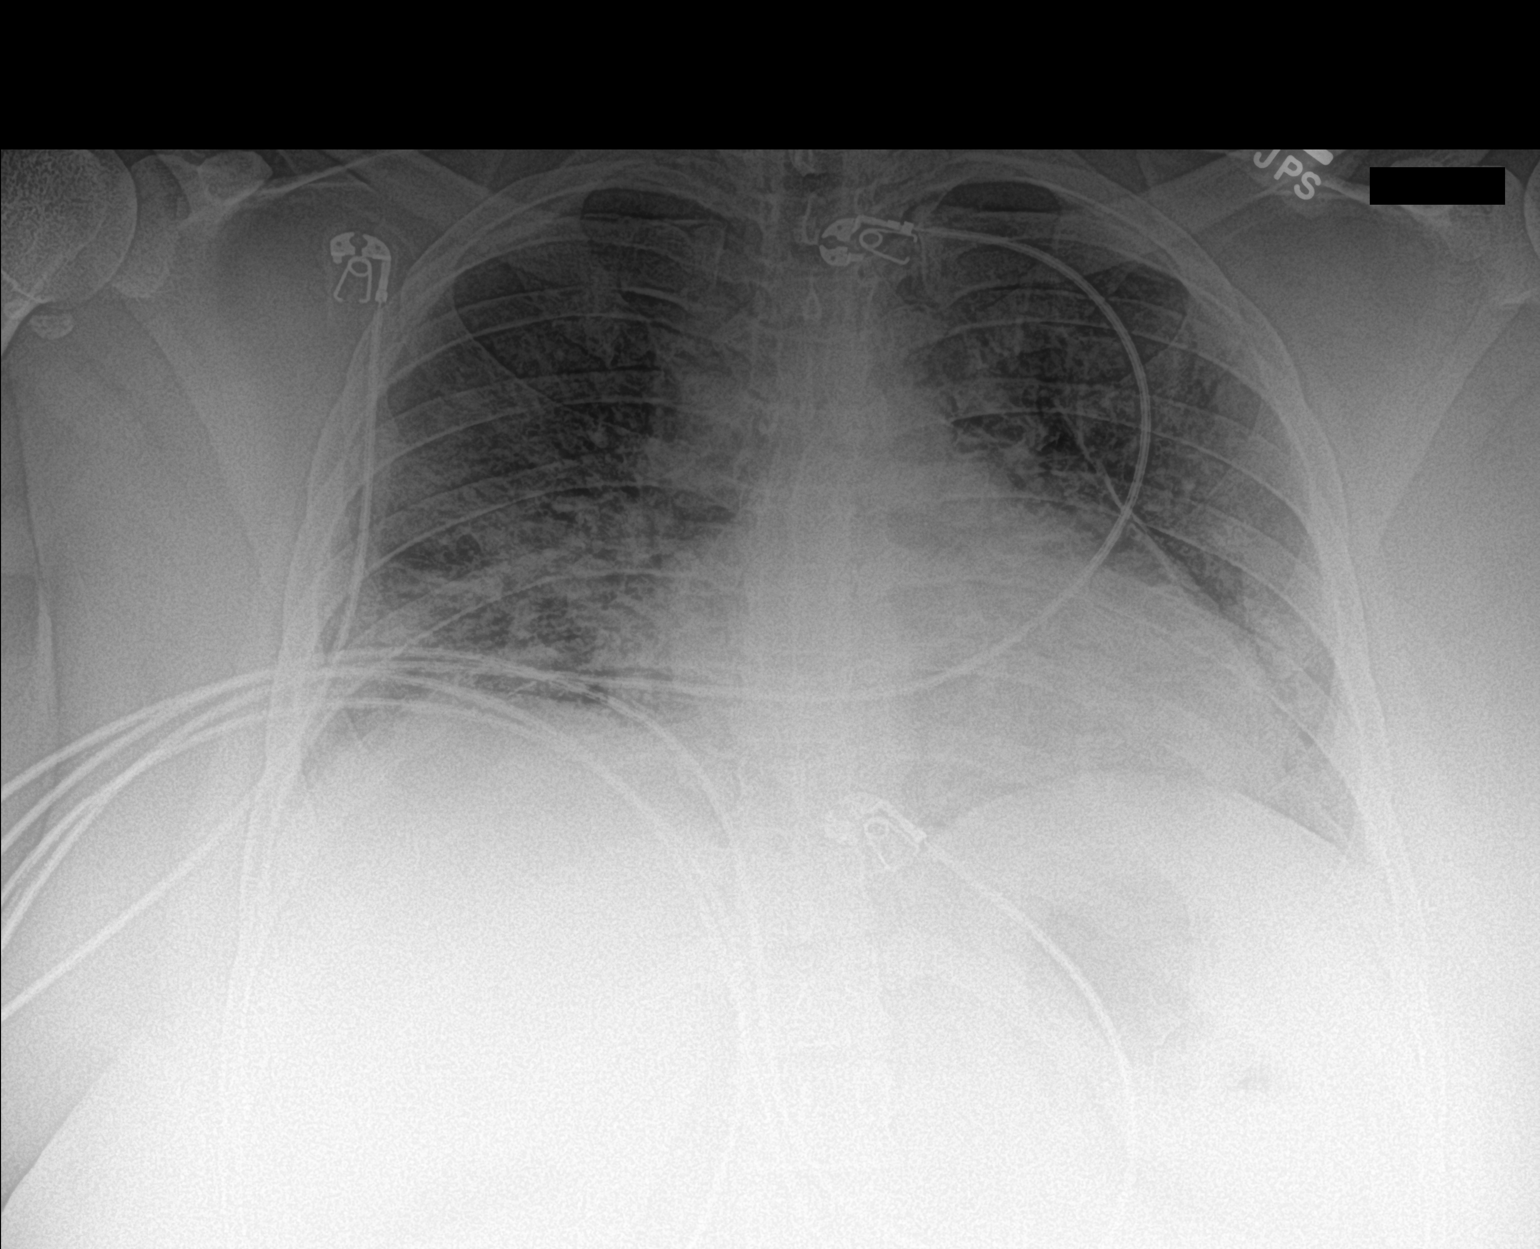

[1 of 1 positions shown; findings below may reference images not displayed]

FINDINGS: Trachea is midline. Cardiomediastinal contours accentuated by
portable technique are stable.

Small LEFT apical pneumothorax is suspected.

Lucency along the medial aspect of the LEFT lung, along the LEFT
heart border with similar appearance, associated with linear density
as well, unchanged. Interval removal of LEFT-sided chest tube.

Patchy areas of interstitial and airspace opacity worse at the lung
bases similar to the prior exam.

No acute skeletal process on limited assessment.
IMPRESSION: 1. Interval removal of LEFT-sided chest tube, otherwise no change in
the appearance of the chest since the previous radiograph.
2. Signs of pneumomediastinum and potential persistent LEFT apical
pneumothorax. Size of pneumothorax would be difficult to quantify on
portable radiography if anterior.
3. Patchy areas of interstitial and airspace opacity worse at the
lung bases similar to the prior exam.

## 2021-07-12 ENCOUNTER — Emergency Department (HOSPITAL_BASED_OUTPATIENT_CLINIC_OR_DEPARTMENT_OTHER): Payer: BC Managed Care – PPO

## 2021-07-12 ENCOUNTER — Encounter (HOSPITAL_BASED_OUTPATIENT_CLINIC_OR_DEPARTMENT_OTHER): Payer: Self-pay | Admitting: Emergency Medicine

## 2021-07-12 ENCOUNTER — Emergency Department (HOSPITAL_BASED_OUTPATIENT_CLINIC_OR_DEPARTMENT_OTHER)
Admission: EM | Admit: 2021-07-12 | Discharge: 2021-07-12 | Disposition: A | Discharge status: Home / Self Care | Payer: BC Managed Care – PPO | Attending: Emergency Medicine | Admitting: Emergency Medicine

## 2021-07-12 ENCOUNTER — Other Ambulatory Visit: Payer: Self-pay

## 2021-07-12 DIAGNOSIS — M25561 Pain in right knee: Secondary | ICD-10-CM | POA: Diagnosis not present

## 2021-07-12 DIAGNOSIS — E039 Hypothyroidism, unspecified: Secondary | ICD-10-CM | POA: Insufficient documentation

## 2021-07-12 DIAGNOSIS — J45909 Unspecified asthma, uncomplicated: Secondary | ICD-10-CM | POA: Insufficient documentation

## 2021-07-12 DIAGNOSIS — Z6841 Body Mass Index (BMI) 40.0 and over, adult: Secondary | ICD-10-CM | POA: Diagnosis not present

## 2021-07-12 MED ORDER — NAPROXEN 500 MG PO TABS: 500.0000 mg | ORAL_TABLET | Freq: Two times a day (BID) | ORAL | 0 refills | Status: AC

## 2021-07-12 NOTE — Discharge Instructions (Addendum)
A prescription by the name of naproxen has been sent to your pharmacy.  This is an anti-inflammatory to help with pain relief.  Do not take this medication if you are pregnant.  Always take this with plenty of food and water.  You may also take Tylenol at the same time.  Do not take Motrin or ibuprofen with the naproxen as these are the same medication class.  You been provided the contact information for an orthopedic specialist by the name of Dr. Percell Miller.  Please call and schedule an appointment to be reevaluated within the next 3 to 4 days.  Return to the ED for new or worsening symptoms as discussed.

## 2021-07-12 NOTE — ED Triage Notes (Signed)
Patient reports her right knee has slipped out of place twice in the last week and popped back in. C/o right knee pain.

## 2021-07-12 NOTE — ED Provider Notes (Signed)
[facility] EMERGENCY DEPARTMENT Provider Note   CSN: [id number] Arrival date & time: 07/12/21  1855     History  Chief Complaint  Patient presents with   Knee Pain    Sarah Calhoun is a 26 y.o. female presenting today with right knee pain.  Reports the patella becoming dislocated 2 times this week.  Notices increased pain with bending her knee and when this happens.  Denies recent trauma or injury to the knee.  No prior surgery on this knee.  This is happened once before back in January.  Patella appears to go back in place with some assistance.  Has been using a small knee brace to try and help provide support.  Feels nervous to walk on it and use it much.  Denies numbness, tingling, weakness of the lower extremities.  No recent fevers or infections.  Denies significant mass, warmth, tenderness, or swelling to the posterior aspect of the knee or the lower leg.  The history is provided by the patient and medical records.  Knee Pain     Home Medications Prior to Admission medications   Medication Sig Start Date End Date Taking? Authorizing Provider  naproxen (NAPROSYN) 500 MG tablet Take 1 tablet (500 mg total) by mouth 2 (two) times daily. 07/12/21  Yes Prince Rome, PA-C  albuterol (VENTOLIN HFA) 108 (90 Base) MCG/ACT inhaler Inhale 1-2 puffs into the lungs every 6 (six) hours as needed for wheezing or shortness of breath. 09/30/19   Henderly, Britni A, PA-C  amoxicillin-clavulanate (AUGMENTIN) 875-125 MG tablet Take 1 tablet by mouth every 12 (twelve) hours. 05/07/20   Lorin Glass, PA-C  cetirizine (ZYRTEC ALLERGY) 10 MG tablet Take 10 mg by mouth daily as needed for allergies or rhinitis.    [provider]  fluticasone (FLONASE) 50 MCG/ACT nasal spray Place 2 sprays into both nostrils daily. 09/30/19   Henderly, Britni A, PA-C  levothyroxine (SYNTHROID) 200 MCG tablet Take 200 mcg by mouth daily before breakfast.    [provider]  ondansetron  (ZOFRAN ODT) 4 MG disintegrating tablet Take 1 tablet (4 mg total) by mouth every 8 (eight) hours as needed for nausea or vomiting. Patient taking differently: Take 4 mg by mouth every 8 (eight) hours as needed for nausea or vomiting (DISSOLVE ORALLY).  09/30/19   Henderly, Britni A, PA-C  oxyCODONE-acetaminophen (PERCOCET) 5-325 MG tablet Take 1 tablet by mouth every 6 (six) hours as needed for severe pain. 05/05/20   Sherwood Gambler, MD  pantoprazole (PROTONIX) 40 MG tablet Take 1 tablet (40 mg total) by mouth 2 (two) times daily for 14 days. 10/15/19 10/29/19  Bonnielee Haff, MD  polyethylene glycol (MIRALAX / GLYCOLAX) 17 g packet Take 17 g by mouth daily as needed. Patient taking differently: Take 17 g by mouth daily as needed for mild constipation.  10/15/19   Bonnielee Haff, MD      Allergies    Patient has no known allergies.    Review of Systems   Review of Systems  Musculoskeletal:        Right knee pain   Physical Exam Updated Vital Signs BP (!) 138/91 (BP Location: Left Arm)   Pulse 73   Temp 98.7 F (37.1 C)   Resp 18   Ht '5\' 5"'$  (1.651 m)   Wt 125.2 kg   SpO2 97%   BMI 45.93 kg/m  Physical Exam Vitals and nursing note reviewed.  Constitutional:      General: She is  not in acute distress.    Appearance: She is well-developed.  HENT:     Head: Normocephalic and atraumatic.  Eyes:     Conjunctiva/sclera: Conjunctivae normal.  Cardiovascular:     Rate and Rhythm: Normal rate and regular rhythm.     Pulses: Normal pulses.  Pulmonary:     Effort: Pulmonary effort is normal. No respiratory distress.     Breath sounds: Normal breath sounds.  Abdominal:     Palpations: Abdomen is soft.     Tenderness: There is no abdominal tenderness.  Musculoskeletal:        General: Swelling (Mild) and tenderness (Mild) present.     Cervical back: Neck supple.     Right lower leg: No edema.     Left lower leg: No edema.     Comments: Mild increased crepitus and mild swelling of the  right knee more than the left.  No obvious bony deformity or malalignment.  Range of motion appears intact.  Mild increased tenderness with flexion of the knee.  Lower extremities appear neurovascularly intact.  No warmth, significant edema, or erythema of the lower extremities.  No obvious mass or tenderness of the posterior aspect of the knee.  Skin:    General: Skin is warm and dry.     Capillary Refill: Capillary refill takes less than 2 seconds.  Neurological:     Mental Status: She is alert.  Psychiatric:        Mood and Affect: Mood normal.    ED Results / Procedures / Treatments   Labs (all labs ordered are listed, but only abnormal results are displayed) Labs Reviewed - No data to display  EKG None  Radiology DG Knee Complete 4 Views Right  Result Date: 07/12/2021 CLINICAL DATA:  Right knee pain. EXAM: RIGHT KNEE - COMPLETE 4+ VIEW COMPARISON:  None Available. FINDINGS: No evidence of fracture, dislocation, or joint effusion. No evidence of arthropathy or other focal bone abnormality. Soft tissues are unremarkable. IMPRESSION: Negative. Electronically Signed   By: Virgina Norfolk M.D.   On: 07/12/2021 20:04    Procedures Procedures    Medications Ordered in ED Medications - No data to display  ED Course/ Medical Decision Making/ A&P                           Medical Decision Making Amount and/or Complexity of Data Reviewed External Data Reviewed: notes. Labs: ordered. Decision-making details documented in ED Course. Radiology: ordered and independent interpretation performed. Decision-making details documented in ED Course. ECG/medicine tests: ordered and independent interpretation performed. Decision-making details documented in ED Course.  Risk OTC drugs. Prescription drug management.   26 y.o. female presents to the ED for concern of Knee Pain   This involves an extensive number of treatment options, and is a complaint that carries with it a high risk of  complications and morbidity.    Past Medical History / Co-morbidities / Social History: Hx of morbid obesity, hypothyroidism, asthma, social alcohol use.  Physical Exam: Physical exam performed. The pertinent findings include: Mild increased crepitus and mild swelling of the right knee.  No obvious bony deformit or malalignment.  Range of motion appears intact.  Mild increased tenderness with flexion of the knee.  Imaging Studies: I ordered imaging studies including XR right knee .  I independently visualized and interpreted said imaging.  Pertinent results include: No evidence of fracture or acute dislocation I agree with the radiologist interpretation.  ED Course/Disposition: Pt well-appearing on exam.  Presenting with right knee pain.  Patient states her knee keeps slipping out of place, has happened twice in the last week.  This happens at rest.  No recent injury or trauma.  Patient X-Ray negative for obvious fracture or dislocation.  Not suspicious of DVT or Baker's cyst at this time.  Pain managed in ED.  Pt advised to follow up with orthopedics for further reevaluation and continued medical management.   Pt given brace and crutches while in ED.  Conservative therapy recommended and discussed.  Patient is in NAD and good condition at time of discharge.  After consideration of the diagnostic results and the patient's encounter today, I feel that the emergency department workup does not suggest an emergent condition requiring admission or immediate intervention beyond what has been performed at this time.  The patient is safe for discharge and has been instructed to return immediately for worsening symptoms, change in symptoms or any other concerns.  Discussed course of treatment thoroughly with the patient, whom demonstrated understanding.  Patient in agreement and has no further questions.  I discussed this case with my attending physician Dr. Alvino Chapel, who agreed with the proposed treatment  course and cosigned this note including patient's presenting symptoms, physical exam, and planned diagnostics and interventions.  Attending physician stated agreement with plan or made changes to plan which were implemented.     This chart was dictated using voice recognition software.  Despite best efforts to proofread, errors can occur which can change the documentation meaning.         Final Clinical Impression(s) / ED Diagnoses Final diagnoses:  Acute pain of right knee  Obesity, Class III, BMI 40-49.9 (morbid obesity) ([place])    Rx / DC Orders ED Discharge Orders          Ordered    naproxen (NAPROSYN) 500 MG tablet  2 times daily        07/12/21 2007              Candace Cruise 16/[birth year] 2016    Davonna Belling, MD 07/13/21 0005

## 2021-07-19 ENCOUNTER — Encounter: Payer: Self-pay | Admitting: Family Medicine

## 2021-07-19 ENCOUNTER — Ambulatory Visit (HOSPITAL_BASED_OUTPATIENT_CLINIC_OR_DEPARTMENT_OTHER)
Admission: RE | Admit: 2021-07-19 | Discharge: 2021-07-19 | Disposition: A | Discharge status: Home / Self Care | Payer: BC Managed Care – PPO | Source: Ambulatory Visit | Attending: Family Medicine | Admitting: Family Medicine

## 2021-07-19 ENCOUNTER — Ambulatory Visit: Payer: BC Managed Care – PPO | Admitting: Family Medicine

## 2021-07-19 ENCOUNTER — Ambulatory Visit: Payer: Self-pay

## 2021-07-19 VITALS — BP 128/92 | Ht 65.0 in | Wt 276.0 lb

## 2021-07-19 DIAGNOSIS — S83004A Unspecified dislocation of right patella, initial encounter: Secondary | ICD-10-CM | POA: Diagnosis not present

## 2021-07-19 NOTE — Assessment & Plan Note (Signed)
Acutely occurring.  Having a few instances of patellar dislocation.  She is able to self reduce.  No significant effusion on exam today. -Counseled on home exercise therapy and supportive care. -X-ray. -Provided brace. -Could consider physical therapy or further imaging.

## 2021-07-19 NOTE — Progress Notes (Signed)
  Sarah Calhoun - 26 y.o. female MRN [id number]  Date of birth: Apr 10, 1995  SUBJECTIVE:  Including CC & ROS.  No chief complaint on file.   Sarah Calhoun is a 26 y.o. female that is presenting with acute patellar dislocation.  She has had 2 or 3 occurrences.  Most recent was a few days ago.  Her original dislocation was about 6 months ago.  No significant traumatic experience or history of surgery.  Reviewed emergency department note from 6/1 shows she was counseled on supportive care. Independent review of the right knee x-ray from 6/1 shows no acute changes.  Review of Systems See HPI   HISTORY: Past Medical, Surgical, Social, and Family History Reviewed & Updated per EMR.   Pertinent Historical Findings include:  Past Medical History:  Diagnosis Date   Asthma    Hypothyroidism     History reviewed. No pertinent surgical history.   PHYSICAL EXAM:  VS: BP (!) 128/92 (BP Location: Left Arm, Patient Position: Sitting)   Ht '5\' 5"'$  (1.651 m)   Wt 276 lb (125.2 kg)   BMI 45.93 kg/m  Physical Exam Gen: NAD, alert, cooperative with exam, well-appearing MSK:  Neurovascularly intact    Limited ultrasound: Right knee:  No effusion suprapatellar pouch. Normal-appearing quadricep and patellar tendon. No significant changes of the medial lateral joint space. No significant changes in the medial retinaculum  Summary: No significant structural changes.  Ultrasound and interpretation by Clearance Coots, MD    ASSESSMENT & PLAN:   Dislocation of right patella Acutely occurring.  Having a few instances of patellar dislocation.  She is able to self reduce.  No significant effusion on exam today. -Counseled on home exercise therapy and supportive care. -X-ray. -Provided brace. -Could consider physical therapy or further imaging.

## 2021-07-19 NOTE — Patient Instructions (Signed)
Nice to meet you Please use ice as needed  Please try the brace  Please try the exercises  I will call with the xray results.   Please send me a message in MyChart with any questions or updates.  Please see me back in 4 weeks. .   --Dr. Raeford Razor

## 2021-07-23 ENCOUNTER — Telehealth: Payer: Self-pay | Admitting: Family Medicine

## 2021-07-23 NOTE — Telephone Encounter (Signed)
Left VM for patient. If she calls back please have her speak with a nurse/CMA and inform that her xrays look good. Will continue with physical therapy.   If any questions then please take the best time and phone number to call and I will try to call her back.   Rosemarie Ax, MD [facility] 07/23/2021, 10:51 AM

## 2021-07-25 NOTE — Telephone Encounter (Signed)
Pt informed of below.  

## 2021-08-20 ENCOUNTER — Ambulatory Visit: Payer: BC Managed Care – PPO | Admitting: Family Medicine

## 2021-10-08 ENCOUNTER — Encounter (HOSPITAL_BASED_OUTPATIENT_CLINIC_OR_DEPARTMENT_OTHER): Payer: Self-pay | Admitting: Urology

## 2021-10-08 ENCOUNTER — Other Ambulatory Visit: Payer: Self-pay

## 2021-10-08 ENCOUNTER — Emergency Department (HOSPITAL_BASED_OUTPATIENT_CLINIC_OR_DEPARTMENT_OTHER)
Admission: EM | Admit: 2021-10-08 | Discharge: 2021-10-08 | Disposition: A | Discharge status: Home / Self Care | Payer: BC Managed Care – PPO | Attending: Emergency Medicine | Admitting: Emergency Medicine

## 2021-10-08 DIAGNOSIS — J029 Acute pharyngitis, unspecified: Secondary | ICD-10-CM | POA: Insufficient documentation

## 2021-10-08 DIAGNOSIS — R590 Localized enlarged lymph nodes: Secondary | ICD-10-CM | POA: Diagnosis not present

## 2021-10-08 DIAGNOSIS — Z20822 Contact with and (suspected) exposure to covid-19: Secondary | ICD-10-CM | POA: Insufficient documentation

## 2021-10-08 DIAGNOSIS — E039 Hypothyroidism, unspecified: Secondary | ICD-10-CM | POA: Diagnosis not present

## 2021-10-08 DIAGNOSIS — Z79899 Other long term (current) drug therapy: Secondary | ICD-10-CM | POA: Insufficient documentation

## 2021-10-08 DIAGNOSIS — J45909 Unspecified asthma, uncomplicated: Secondary | ICD-10-CM | POA: Diagnosis not present

## 2021-10-08 DIAGNOSIS — I889 Nonspecific lymphadenitis, unspecified: Secondary | ICD-10-CM

## 2021-10-08 LAB — GROUP A STREP BY PCR: Group A Strep by PCR: NOT DETECTED

## 2021-10-08 LAB — RESP PANEL BY RT-PCR (FLU A&B, COVID) ARPGX2
Influenza A by PCR: NEGATIVE
Influenza B by PCR: NEGATIVE
SARS Coronavirus 2 by RT PCR: NEGATIVE

## 2021-10-08 MED ORDER — AMOXICILLIN-POT CLAVULANATE 875-125 MG PO TABS: 1.0000 | ORAL_TABLET | Freq: Two times a day (BID) | ORAL | 0 refills | Status: AC

## 2021-10-08 MED ORDER — KETOROLAC TROMETHAMINE 60 MG/2ML IM SOLN
30.0000 mg | Freq: Once | INTRAMUSCULAR | Status: AC
  Administered 2021-10-08: 30 mg via INTRAMUSCULAR
  Filled 2021-10-08: qty 2

## 2021-10-08 NOTE — ED Triage Notes (Signed)
Pt states noticed swollen gland on right neck 2 days ago, sore throat that started yesterday.  Denies fever Denies N/V

## 2021-10-08 NOTE — Discharge Instructions (Addendum)
Your COVID-19 and influenza PCR testing resulted negative.  Your strep PCR testing also resulted negative.  Your symptoms are consistent with likely lymphadenitis on the right which we will treat with a course of Augmentin.

## 2021-10-08 NOTE — ED Provider Notes (Signed)
[name] EMERGENCY DEPARTMENT Provider Note   CSN: [id number] Arrival date & time: 10/08/21  4098     History  Chief Complaint  Patient presents with   Sore Throat    Sarah Calhoun is a 26 y.o. female.   Sore Throat     26 year old female with medical history significant for asthma and hypothyroidism who presents to the emergency department with a swelling lymph node on the right side of her neck that started 2 days ago.  She states that she developed a sore throat yesterday.  She denies any fevers or chills.  She denies any nausea, vomiting, abdominal pain.  She is tolerating oral intake.  She has good range of motion of her neck.  She denies any neck stiffness.  She denies any sick contacts.  Home Medications Prior to Admission medications   Medication Sig Start Date End Date Taking? Authorizing Provider  amoxicillin-clavulanate (AUGMENTIN) 875-125 MG tablet Take 1 tablet by mouth every 12 (twelve) hours for 10 days. 10/08/21 10/18/21 Yes Regan Lemming, MD  cetirizine (ZYRTEC ALLERGY) 10 MG tablet Take 10 mg by mouth daily as needed for allergies or rhinitis.    [provider]  levothyroxine (SYNTHROID) 200 MCG tablet Take 200 mcg by mouth daily before breakfast.    [provider]  naproxen (NAPROSYN) 500 MG tablet Take 1 tablet (500 mg total) by mouth 2 (two) times daily. 02/11/89   Prince Rome, PA-C      Allergies    Patient has no known allergies.    Review of Systems   Review of Systems  All other systems reviewed and are negative.   Physical Exam Updated Vital Signs BP (!) 156/98 (BP Location: Right Arm)   Pulse 68   Temp 97.8 F (36.6 C) (Oral)   Resp 18   Ht '5\' 5"'$  (1.651 m)   Wt 130.2 kg   LMP 09/08/2021 (Approximate)   SpO2 96%   BMI 47.76 kg/m  Physical Exam Vitals and nursing note reviewed.  Constitutional:      General: She is not in acute distress.    Appearance: She is well-developed. She is obese.  HENT:      Head: Normocephalic and atraumatic.     Jaw: Tenderness present. No trismus, swelling or malocclusion.     Comments: Right-sided drop tenderness to palpation about the TMJ with no trismus, no malocclusion, no significant swelling    Mouth/Throat:     Pharynx: No pharyngeal swelling, oropharyngeal exudate or posterior oropharyngeal erythema.     Tonsils: No tonsillar exudate or tonsillar abscesses. 0 on the right. 0 on the left.  Eyes:     Conjunctiva/sclera: Conjunctivae normal.  Neck:     Comments: Mild tender cervical lymphadenopathy on the right Cardiovascular:     Rate and Rhythm: Normal rate and regular rhythm.     Heart sounds: No murmur heard. Pulmonary:     Effort: Pulmonary effort is normal. No respiratory distress.     Breath sounds: Normal breath sounds.  Abdominal:     Palpations: Abdomen is soft.     Tenderness: There is no abdominal tenderness.  Musculoskeletal:        General: No swelling.     Cervical back: Normal range of motion and neck supple.  Lymphadenopathy:     Cervical: Cervical adenopathy present.  Skin:    General: Skin is warm and dry.     Capillary Refill: Capillary refill takes less than 2 seconds.  Neurological:  Mental Status: She is alert.  Psychiatric:        Mood and Affect: Mood normal.     ED Results / Procedures / Treatments   Labs (all labs ordered are listed, but only abnormal results are displayed) Labs Reviewed  GROUP A STREP BY PCR  RESP PANEL BY RT-PCR (FLU A&B, COVID) [id number]    EKG None  Radiology No results found.  Procedures Procedures    Medications Ordered in ED Medications  ketorolac (TORADOL) injection 30 mg (30 mg Intramuscular Given 10/08/21 8338)    ED Course/ Medical Decision Making/ A&P                           Medical Decision Making Risk Prescription drug management.   26 year old female with medical history significant for asthma and hypothyroidism who presents to the emergency department  with a swelling lymph node on the right side of her neck that started 2 days ago.  She states that she developed a sore throat yesterday.  She denies any fevers or chills.  She denies any nausea, vomiting, abdominal pain.  She is tolerating oral intake.  She has good range of motion of her neck.  She denies any neck stiffness.  She denies any sick contacts.    On arrival, the patient was afebrile, hemodynamically stable.  Patient presenting with roughly 2 days of right-sided tender cervical lymphadenopathy.   Differential diagnosis includes lymphadenitis, developing viral URI, strep throat, low concern for PTA or RPA based on physical exam.  Considered COVID-19 infection, also considered TMJ.  The patient was administered IM Toradol.  COVID-19 and influenza PCR testing in addition to strep PCR testing was collected and resulted negative.  Symptoms are consistent with lymphadenitis for which Augmentin was prescribed.  Well-appearing, stable vitals, tolerating oral intake, stable for discharge and outpatient management.   Final Clinical Impression(s) / ED Diagnoses Final diagnoses:  Sore throat  Lymphadenitis    Rx / DC Orders ED Discharge Orders          Ordered    amoxicillin-clavulanate (AUGMENTIN) 875-125 MG tablet  Every 12 hours        10/08/21 0811              Regan Lemming, MD 10/08/21 [phone]

## 2021-10-08 NOTE — ED Notes (Signed)
Discharge instructions reviewed with patient. Patient verbalizes understanding, no further questions at this time. Medications/prescriptions and follow up information provided. No acute distress noted at time of departure.  

## 2022-02-05 ENCOUNTER — Emergency Department (HOSPITAL_BASED_OUTPATIENT_CLINIC_OR_DEPARTMENT_OTHER)
Admission: EM | Admit: 2022-02-05 | Discharge: 2022-02-05 | Disposition: A | Discharge status: Home / Self Care | Payer: BC Managed Care – PPO | Attending: Emergency Medicine | Admitting: Emergency Medicine

## 2022-02-05 ENCOUNTER — Emergency Department (HOSPITAL_BASED_OUTPATIENT_CLINIC_OR_DEPARTMENT_OTHER): Payer: BC Managed Care – PPO

## 2022-02-05 ENCOUNTER — Encounter (HOSPITAL_BASED_OUTPATIENT_CLINIC_OR_DEPARTMENT_OTHER): Payer: Self-pay

## 2022-02-05 DIAGNOSIS — J101 Influenza due to other identified influenza virus with other respiratory manifestations: Secondary | ICD-10-CM | POA: Diagnosis not present

## 2022-02-05 DIAGNOSIS — Z20822 Contact with and (suspected) exposure to covid-19: Secondary | ICD-10-CM | POA: Diagnosis not present

## 2022-02-05 DIAGNOSIS — R5383 Other fatigue: Secondary | ICD-10-CM | POA: Diagnosis present

## 2022-02-05 LAB — RESP PANEL BY RT-PCR (RSV, FLU A&B, COVID)  RVPGX2
Influenza A by PCR: POSITIVE — AB
Influenza B by PCR: NEGATIVE
Resp Syncytial Virus by PCR: NEGATIVE
SARS Coronavirus 2 by RT PCR: NEGATIVE

## 2022-02-05 LAB — GROUP A STREP BY PCR: Group A Strep by PCR: NOT DETECTED

## 2022-02-05 MED ORDER — LIDOCAINE 5 % EX PTCH
1.0000 | MEDICATED_PATCH | CUTANEOUS | Status: DC
  Administered 2022-02-05: 1 via TRANSDERMAL
  Filled 2022-02-05: qty 1

## 2022-02-05 MED ORDER — ALBUTEROL SULFATE HFA 108 (90 BASE) MCG/ACT IN AERS: 1.0000 | INHALATION_SPRAY | Freq: Four times a day (QID) | RESPIRATORY_TRACT | 0 refills | Status: DC | PRN

## 2022-02-05 MED ORDER — OSELTAMIVIR PHOSPHATE 75 MG PO CAPS: 75.0000 mg | ORAL_CAPSULE | Freq: Two times a day (BID) | ORAL | 0 refills | Status: AC

## 2022-02-05 MED ORDER — ALBUTEROL SULFATE HFA 108 (90 BASE) MCG/ACT IN AERS
2.0000 | INHALATION_SPRAY | Freq: Four times a day (QID) | RESPIRATORY_TRACT | Status: DC | PRN
  Filled 2022-02-05: qty 6.7

## 2022-02-05 MED ORDER — IBUPROFEN 800 MG PO TABS
800.0000 mg | ORAL_TABLET | Freq: Once | ORAL | Status: AC
  Administered 2022-02-05: 800 mg via ORAL
  Filled 2022-02-05: qty 1

## 2022-02-05 NOTE — ED Provider Notes (Signed)
[facility] Provider Note   CSN: [id number] Arrival date & time: 02/05/22  1321     History  Chief Complaint  Patient presents with   Sore Throat    Sarah Calhoun is a 26 y.o. female, no pertinent past medical history, who presents to the ED secondary to sore throat, fatigue, wheezing, for the last 36 Hours.  She states she feels just very drained, and has a sore throat.  Has taken DayQuil and NyQuil without relief.  No history of asthma, but states she has been wheezing quite a bit.  Denies any fevers or chills.  States it hurts to cough.  Home Medications Prior to Admission medications   Medication Sig Start Date End Date Taking? Authorizing Provider  albuterol (VENTOLIN HFA) 108 (90 Base) MCG/ACT inhaler Inhale 1-2 puffs into the lungs every 6 (six) hours as needed for wheezing or shortness of breath. 02/05/22  Yes Reyanne Hussar L, PA  oseltamivir (TAMIFLU) 75 MG capsule Take 1 capsule (75 mg total) by mouth every 12 (twelve) hours. 02/05/22  Yes Caterina Racine L, PA  cetirizine (ZYRTEC ALLERGY) 10 MG tablet Take 10 mg by mouth daily as needed for allergies or rhinitis.    [provider]  levothyroxine (SYNTHROID) 200 MCG tablet Take 200 mcg by mouth daily before breakfast.    [provider]  naproxen (NAPROSYN) 500 MG tablet Take 1 tablet (500 mg total) by mouth 2 (two) times daily. 04/17/62   Prince Rome, PA-C      Allergies    Patient has no known allergies.    Review of Systems   Review of Systems  Constitutional:  Positive for fatigue. Negative for chills and fever.  HENT:  Positive for sore throat. Negative for ear pain.   Respiratory:  Positive for cough, shortness of breath and wheezing.     Physical Exam Updated Vital Signs BP (!) 133/98 (BP Location: Left Arm)   Pulse 92   Temp 98.6 F (37 C) (Oral)   Resp 18   Ht '5\' 5"'$  (1.651 m)   Wt 128.8 kg   SpO2 97%   BMI 47.26 kg/m  Physical Exam Vitals and  nursing note reviewed.  Constitutional:      General: She is not in acute distress.    Appearance: She is well-developed.  HENT:     Head: Normocephalic and atraumatic.     Right Ear: Tympanic membrane normal.     Left Ear: Tympanic membrane normal.     Mouth/Throat:     Mouth: Mucous membranes are moist.     Pharynx: Posterior oropharyngeal erythema present. No oropharyngeal exudate.  Eyes:     Conjunctiva/sclera: Conjunctivae normal.  Cardiovascular:     Rate and Rhythm: Normal rate and regular rhythm.     Heart sounds: No murmur heard. Pulmonary:     Effort: Pulmonary effort is normal. No respiratory distress.     Breath sounds: Normal breath sounds.  Abdominal:     Palpations: Abdomen is soft.     Tenderness: There is no abdominal tenderness.  Musculoskeletal:        General: No swelling.     Cervical back: Neck supple.  Skin:    General: Skin is warm and dry.     Capillary Refill: Capillary refill takes less than 2 seconds.  Neurological:     Mental Status: She is alert.  Psychiatric:        Mood and Affect: Mood normal.  ED Results / Procedures / Treatments   Labs (all labs ordered are listed, but only abnormal results are displayed) Labs Reviewed  RESP PANEL BY RT-PCR (RSV, FLU A&B, COVID)  RVPGX2 - Abnormal; Notable for the following components:      Result Value   Influenza A by PCR POSITIVE (*)    All other components within normal limits  GROUP A STREP BY PCR    EKG None  Radiology DG Chest 2 View  Result Date: 02/05/2022 CLINICAL DATA:  Two day history of cough and sore throat EXAM: CHEST - 2 VIEW COMPARISON:  Chest radiograph dated 12/17/2019 FINDINGS: Normal lung volumes. Decreased but persistent persistent bilateral lower lung linear and hazy opacities. No pleural effusion or pneumothorax. The heart size and mediastinal contours are within normal limits. The visualized skeletal structures are unremarkable. IMPRESSION: Decreased but persistent  bilateral lower lung linear and hazy opacities, which may reflect linear atelectasis/scarring. Electronically Signed   By: Darrin Nipper M.D.   On: 02/05/2022 15:56    Procedures Procedures    Medications Ordered in ED Medications  lidocaine (LIDODERM) 5 % 1 patch (1 patch Transdermal Patch Applied 02/05/22 1543)  albuterol (VENTOLIN HFA) 108 (90 Base) MCG/ACT inhaler 2 puff (has no administration in time range)  ibuprofen (ADVIL) tablet 800 mg (800 mg Oral Given 02/05/22 1543)    ED Course/ Medical Decision Making/ A&P                           Medical Decision Making Patient is 26 year old female, here for sore throat, fatigue wheezing for the last 36 hours.  She is overall well-appearing, but does have some erythema of her throat, no exudate however.  I am suspicious for a viral process will obtain chest x-ray, and then additional a viral panel to further evaluate.  Will give her ibuprofen, a lidocaine patch, for sore chest from coughing, and albuterol for wheezing.  Amount and/or Complexity of Data Reviewed Labs:     Details: Positive for flu Radiology: ordered.    Details: Chest x-ray shows no acute findings, findings likely secondary to old scarring. Discussion of management or test interpretation with external provider(s): Patient is within the 48 hours for Tamiflu, we discussed use of Tamiflu and side effects with it, she would like to pursue this.  Also sent albuterol to pharmacy for wheezing, return precautions were emphasized.  She was given a note for off work.  She is overall well-appearing and feeling better at the time of her discharge.  Risk Prescription drug management.   Final Clinical Impression(s) / ED Diagnoses Final diagnoses:  Influenza A    Rx / DC Orders ED Discharge Orders          Ordered    oseltamivir (TAMIFLU) 75 MG capsule  Every 12 hours        02/05/22 1612    albuterol (VENTOLIN HFA) 108 (90 Base) MCG/ACT inhaler  Every 6 hours PRN         02/05/22 1612              Vashti Bolanos, [place], PA 02/05/22 1614    Sherwood Gambler, MD 02/07/22 1006

## 2022-02-05 NOTE — Discharge Instructions (Addendum)
Please drink lots of fluids, take Tylenol and ibuprofen for pain control, and use your inhaler as needed for wheezing.  If you develop severe shortness of breath, please return to the ER.

## 2022-02-05 NOTE — ED Triage Notes (Signed)
C/o sore throat/cough x 2 days. Denies fever. Taking OTC meds without relief.

## 2022-02-12 ENCOUNTER — Emergency Department (HOSPITAL_BASED_OUTPATIENT_CLINIC_OR_DEPARTMENT_OTHER): Payer: BC Managed Care – PPO

## 2022-02-12 ENCOUNTER — Telehealth: Payer: Self-pay | Admitting: *Deleted

## 2022-02-12 ENCOUNTER — Emergency Department (HOSPITAL_BASED_OUTPATIENT_CLINIC_OR_DEPARTMENT_OTHER)
Admission: EM | Admit: 2022-02-12 | Discharge: 2022-02-12 | Disposition: A | Discharge status: Home / Self Care | Payer: BC Managed Care – PPO | Attending: Emergency Medicine | Admitting: Emergency Medicine

## 2022-02-12 ENCOUNTER — Encounter (HOSPITAL_BASED_OUTPATIENT_CLINIC_OR_DEPARTMENT_OTHER): Payer: Self-pay

## 2022-02-12 DIAGNOSIS — Z7951 Long term (current) use of inhaled steroids: Secondary | ICD-10-CM | POA: Insufficient documentation

## 2022-02-12 DIAGNOSIS — J069 Acute upper respiratory infection, unspecified: Secondary | ICD-10-CM | POA: Insufficient documentation

## 2022-02-12 DIAGNOSIS — J4520 Mild intermittent asthma, uncomplicated: Secondary | ICD-10-CM | POA: Insufficient documentation

## 2022-02-12 MED ORDER — DEXAMETHASONE SODIUM PHOSPHATE 10 MG/ML IJ SOLN
10.0000 mg | Freq: Once | INTRAMUSCULAR | Status: AC
Start: 2022-02-12 — End: 2022-02-12
  Administered 2022-02-12: 10 mg via INTRAMUSCULAR
  Filled 2022-02-12: qty 1

## 2022-02-12 MED ORDER — PREDNISONE 20 MG PO TABS: 40.0000 mg | ORAL_TABLET | Freq: Every day | ORAL | 0 refills | Status: AC

## 2022-02-12 MED ORDER — METHYLPREDNISOLONE 4 MG PO TBPK
ORAL_TABLET | ORAL | 0 refills | Status: DC
Start: 2022-02-12 — End: 2022-02-12

## 2022-02-12 MED ORDER — ALBUTEROL SULFATE HFA 108 (90 BASE) MCG/ACT IN AERS: 1.0000 | INHALATION_SPRAY | Freq: Four times a day (QID) | RESPIRATORY_TRACT | 0 refills | Status: DC | PRN

## 2022-02-12 MED ORDER — GUAIFENESIN 100 MG/5ML PO LIQD: 100.0000 mg | ORAL | 0 refills | Status: AC | PRN

## 2022-02-12 MED ORDER — AEROCHAMBER PLUS FLO-VU MISC
1.0000 | Freq: Once | Status: AC
Start: 2022-02-12 — End: 2022-02-12
  Administered 2022-02-12: 1
  Filled 2022-02-12: qty 1

## 2022-02-12 NOTE — ED Provider Notes (Signed)
[facility] Provider Note   CSN: [id number] Arrival date & time: 02/12/22  0908     History  Chief Complaint  Patient presents with   Cough    Sarah Calhoun is a 27 y.o. female with Hx of asthma presenting with about 10 days of URI symptoms.  Was diagnosed with influenza A in ED 02/05/22, given Tamiflu and albuterol.  With some improvement, though still with "chest tightness" and unproductive cough.  Occurs with coughing and worsened by cold weather or weather fluctuations.  Hx of asthma.  Hx of COVID pneumonia several years ago which required the placement of a chest tube.  Notes hx of atelectasis in the right lung since then.  Denies fever, chills, chest pain, chest discomfort, or cardiac history.  Denies fevers, N/V/D, or chills.  The history is provided by the patient and medical records.  Cough     Home Medications Prior to Admission medications   Medication Sig Start Date End Date Taking? Authorizing Provider  guaiFENesin (ROBITUSSIN) 100 MG/5ML liquid Take 5-10 mLs (100-200 mg total) by mouth every 4 (four) hours as needed for cough or to loosen phlegm. 02/12/22  Yes Prince Rome, PA-C  predniSONE (DELTASONE) 20 MG tablet Take 2 tablets (40 mg total) by mouth daily for 4 days. 02/12/22 02/16/22 Yes Prince Rome, PA-C  albuterol (VENTOLIN HFA) 108 (90 Base) MCG/ACT inhaler Inhale 1-2 puffs into the lungs every 6 (six) hours as needed for wheezing or shortness of breath. 10/15/79   Prince Rome, PA-C  cetirizine (ZYRTEC ALLERGY) 10 MG tablet Take 10 mg by mouth daily as needed for allergies or rhinitis.    [provider]  levothyroxine (SYNTHROID) 200 MCG tablet Take 200 mcg by mouth daily before breakfast.    [provider]  naproxen (NAPROSYN) 500 MG tablet Take 1 tablet (500 mg total) by mouth 2 (two) times daily. 09/12/97   Prince Rome, PA-C  oseltamivir (TAMIFLU) 75 MG capsule Take 1 capsule (75 mg total) by  mouth every 12 (twelve) hours. 02/05/22   Small, Brooke L, PA      Allergies    Patient has no known allergies.    Review of Systems   Review of Systems  Respiratory:  Positive for cough and chest tightness.     Physical Exam Updated Vital Signs BP (!) 131/91 (BP Location: Left Arm)   Pulse 74   Temp (!) 97.5 F (36.4 C) (Oral)   Resp 18   Ht '5\' 5"'$  (1.651 m)   Wt 129.3 kg   SpO2 99%   BMI 47.43 kg/m  Physical Exam Vitals and nursing note reviewed.  Constitutional:      General: She is not in acute distress.    Appearance: She is well-developed. She is not ill-appearing, toxic-appearing or diaphoretic.  HENT:     Head: Normocephalic and atraumatic.     Comments: Airway patent.  Mild posterior oropharynx erythema.  Uvula not swollen or deviated.  Without tonsillar abscess, erythema, or exudate.    Right Ear: Tympanic membrane, ear canal and external ear normal.     Left Ear: Tympanic membrane, ear canal and external ear normal.     Nose: Congestion and rhinorrhea present.     Mouth/Throat:     Mouth: Mucous membranes are moist.     Pharynx: Oropharynx is clear.  Eyes:     General: No scleral icterus.    Conjunctiva/sclera: Conjunctivae normal.  Neck:  Comments: No meningismus or torticollis Cardiovascular:     Rate and Rhythm: Normal rate and regular rhythm.     Pulses:          Radial pulses are 2+ on the right side and 2+ on the left side.     Heart sounds: Normal heart sounds. No murmur heard. Pulmonary:     Effort: Pulmonary effort is normal. No respiratory distress.     Breath sounds: Normal breath sounds. No decreased breath sounds.     Comments: CTAB with the exception of minimal end expiratory wheezing bilaterally.  Able to communicate without difficulty, without increased respiratory effort.  Adequate air movement appreciated. Abdominal:     General: There is no distension.     Palpations: Abdomen is soft.     Tenderness: There is no abdominal  tenderness. There is no guarding.  Musculoskeletal:        General: No swelling.     Cervical back: Neck supple. No rigidity.  Skin:    General: Skin is warm and dry.     Capillary Refill: Capillary refill takes less than 2 seconds.     Coloration: Skin is not cyanotic, jaundiced or pale.  Neurological:     Mental Status: She is alert and oriented to person, place, and time.  Psychiatric:        Mood and Affect: Mood normal.     ED Results / Procedures / Treatments   Labs (all labs ordered are listed, but only abnormal results are displayed) Labs Reviewed - No data to display  EKG None  Radiology DG Chest 2 View  Result Date: 02/12/2022 CLINICAL DATA:  Cough.  Shortness of breath and tightness. EXAM: CHEST - 2 VIEW COMPARISON:  None Available. FINDINGS: The heart size and mediastinal contours are within normal limits. Low lung volumes with right basilar atelectasis. Lungs are otherwise clear without evidence of focal consolidation or pleural effusion. The visualized skeletal structures are unremarkable. IMPRESSION: Low lung volumes with right basilar atelectasis. No appreciable pleural effusion. Electronically Signed   By: Keane Police D.O.   On: 02/12/2022 09:52    Procedures Procedures    Medications Ordered in ED Medications  aerochamber plus with mask device 1 each (1 each Other Given 02/12/22 1040)  dexamethasone (DECADRON) injection 10 mg (10 mg Intramuscular Given 02/12/22 1039)    ED Course/ Medical Decision Making/ A&P                           Medical Decision Making Amount and/or Complexity of Data Reviewed Radiology: ordered.  Risk OTC drugs. Prescription drug management.   27 y.o. female presents to the ED for concern of Cough   Past Medical History / Co-morbidities / Social History: Hx of asthma, obesity class III, hypothyroidism, pneumomediastinum Social Determinants of Health include: None  Additional History:  Obtained by chart review.  Notably  recent ED visit, see for details  Lab Tests: None  Imaging Studies: I ordered imaging studies including CXR.   I independently visualized and interpreted imaging which showed mild atelectasis of the right lung, no evidence of pneumothorax or pneumonia I agree with the radiologist interpretation.  ED Course: Presenting with 10 days of URI symptoms.  Was diagnosed with influenza A in ED on 02/05/22, given Tamiflu and albuterol.  With some improvement, though still with "chest tightness" and nonproductive cough.  Occurs with coughing and worsened by cold weather.  Hx of asthma.  Hx  of COVID pneumonia several years ago which required the placement of a chest tube.  Notes hx of atelectasis in the right lung since then.  Denies fever, chills, chest pain, chest discomfort, or cardiac history. Pt well-appearing on exam.  Pt afebrile without tonsillar exudate, erythema, or swelling.  Low clinical suspicion for strep pharyngitis.  Satting at 99% on room air.  Hemodynamically stable.  Good air movement with mild wheezing, no rales.  No accessory muscle use, tripoding, drooling, nasal flaring, or retractions appreciated.  Hx of asthma.  CXR shows known atelectasis, however without other acute cardiopulmonary findings.  PERC 0, Wells criteria 0, low risk PE.  Low suspicion for ACS.  Presentation and history provides low suspicion for acute myocarditis or pericarditis.  Without cervical lymphadenopathy or dysphagia.  Presentation non concerning for PTA or RPA.  No trismus or uvula deviation.  Pt able to drink water in ED without difficulty with intact air way.   Low clinical suspicion for otitis media or otitis externa or mastoiditis at this time.    Doubt meningitis.  Chest tightness likely related to asthma and/or bronchitis. Again recently diagnosed with influenza A.  Likely mild early bronchitis following acute pharyngitis.  No abx indicated.  Pt does not appear dehydrated, but did discuss importance of water  rehydration.  Recommended PCP follow up and conservative symptom management.  Decadron IM and AeroChamber provided in the ED today.  Steroid pack, decongestant, Mucinex, and albuterol refill sent to pharmacy.  Patient in NAD and in good condition at time of discharge.   Disposition: After consideration the patient's encounter today, I do not feel today's workup suggests an emergent condition requiring admission or immediate intervention beyond what has been performed at this time.  Safe for discharge; instructed to return immediately for worsening symptoms, change in symptoms or any other concerns.  I have reviewed the patients home medicines and have made adjustments as needed.  Discussed course of treatment with the patient, whom demonstrated understanding.  Patient in agreement and has no further questions.    This chart was dictated using voice recognition software.  Despite best efforts to proofread, errors can occur which can change the documentation meaning.         Final Clinical Impression(s) / ED Diagnoses Final diagnoses:  Viral URI with cough  Mild intermittent asthma without complication    Rx / DC Orders ED Discharge Orders          Ordered    guaiFENesin (ROBITUSSIN) 100 MG/5ML liquid  Every 4 hours PRN        02/12/22 1041    methylPREDNISolone (MEDROL DOSEPAK) 4 MG TBPK tablet  Status:  Discontinued        02/12/22 1041    albuterol (VENTOLIN HFA) 108 (90 Base) MCG/ACT inhaler  Every 6 hours PRN        02/12/22 1041    predniSONE (DELTASONE) 20 MG tablet  Daily        02/12/22 1041              Prince Rome, [place] 41[birth year] 1844    Margette Fast, MD 02/14/22 1652

## 2022-02-12 NOTE — Telephone Encounter (Signed)
Pt called regarding discharge instructions and ED course specifically medications given and what she needs to pick up.  RNCM advised of Rx to be picked up as printed on AVS and Rx given.  Pt grateful for explanation and clarification provided.

## 2022-02-12 NOTE — ED Triage Notes (Signed)
Was here c/o cough & shortness of breath on 12/26. C/o continued chest tightness with breathing & continued cough. Diagnosed with flu A

## 2022-02-12 NOTE — Discharge Instructions (Signed)
You were seen in the emergency department today for chest tightness with upper respiratory symptoms and nonproductive cough.  You recently tested positive for influenza A.  Your chest tightness is most likely related to your asthma.  Steroid has been provided for you in the emergency department, in addition to an AeroChamber to help with your albuterol.  Treatment is directed at relieving symptoms.  There is no cure for acute viral infections; it is usually best to let them run their course.  Symptoms usually last around 1-2 weeks, though cough may occasionally linger for up to 3-4 weeks.  As discussed, antibiotics are not effective with viral infections, because the infection is caused by a virus, not by bacteria.   Treatments may include:  Increased fluid intake. Sports drinks offer valuable electrolytes, sugars, and fluids.  Breathing heated mist or steam (vaporizer or shower).  Eating chicken soup or other clear broths, and maintaining good nutrition.  Getting plenty of rest.  Using lozenges, tea with honey, or warm salt water for cough relief/sore throat relief (Cepkaol or Halls lozenges are available over-the-counter) Increasing usage of your inhaler if you have asthma.   Take over-the-counter Benadryl, Zyrtec, or Claritin to decrease sinus secretions, with Mucinex to help break up remaining mucus Continue to alternate between Tylenol and ibuprofen for pain and fever control.  You may return to work 24 hours after your temperature has returned to normal.  Please follow up with your primary care doctor in 3-5 days for recheck of ongoing symptoms.  Return to emergency department for emergent changing or worsening of symptoms.

## 2022-05-27 ENCOUNTER — Encounter: Payer: Self-pay | Admitting: *Deleted

## 2023-04-11 DIAGNOSIS — Z0000 Encounter for general adult medical examination without abnormal findings: Secondary | ICD-10-CM | POA: Diagnosis not present

## 2023-04-11 DIAGNOSIS — E88819 Insulin resistance, unspecified: Secondary | ICD-10-CM | POA: Diagnosis not present

## 2023-04-11 DIAGNOSIS — E559 Vitamin D deficiency, unspecified: Secondary | ICD-10-CM | POA: Diagnosis not present

## 2023-04-11 DIAGNOSIS — R7303 Prediabetes: Secondary | ICD-10-CM | POA: Diagnosis not present

## 2023-04-11 DIAGNOSIS — E890 Postprocedural hypothyroidism: Secondary | ICD-10-CM | POA: Diagnosis not present

## 2023-04-11 DIAGNOSIS — N914 Secondary oligomenorrhea: Secondary | ICD-10-CM | POA: Diagnosis not present

## 2023-04-11 DIAGNOSIS — R0683 Snoring: Secondary | ICD-10-CM | POA: Diagnosis not present

## 2023-04-11 DIAGNOSIS — G4719 Other hypersomnia: Secondary | ICD-10-CM | POA: Diagnosis not present

## 2023-04-28 DIAGNOSIS — N930 Postcoital and contact bleeding: Secondary | ICD-10-CM | POA: Diagnosis not present

## 2023-04-28 DIAGNOSIS — Z124 Encounter for screening for malignant neoplasm of cervix: Secondary | ICD-10-CM | POA: Diagnosis not present

## 2023-05-01 DIAGNOSIS — N911 Secondary amenorrhea: Secondary | ICD-10-CM | POA: Diagnosis not present

## 2023-05-01 DIAGNOSIS — Z1331 Encounter for screening for depression: Secondary | ICD-10-CM | POA: Diagnosis not present

## 2023-05-01 DIAGNOSIS — Z113 Encounter for screening for infections with a predominantly sexual mode of transmission: Secondary | ICD-10-CM | POA: Diagnosis not present

## 2023-05-13 DIAGNOSIS — J302 Other seasonal allergic rhinitis: Secondary | ICD-10-CM | POA: Diagnosis not present

## 2023-06-13 DIAGNOSIS — E282 Polycystic ovarian syndrome: Secondary | ICD-10-CM | POA: Diagnosis not present

## 2023-06-13 DIAGNOSIS — N911 Secondary amenorrhea: Secondary | ICD-10-CM | POA: Diagnosis not present

## 2023-07-14 ENCOUNTER — Emergency Department (HOSPITAL_BASED_OUTPATIENT_CLINIC_OR_DEPARTMENT_OTHER)

## 2023-07-14 ENCOUNTER — Encounter (HOSPITAL_BASED_OUTPATIENT_CLINIC_OR_DEPARTMENT_OTHER): Payer: Self-pay

## 2023-07-14 ENCOUNTER — Emergency Department (HOSPITAL_BASED_OUTPATIENT_CLINIC_OR_DEPARTMENT_OTHER)
Admission: EM | Admit: 2023-07-14 | Discharge: 2023-07-14 | Disposition: A | Discharge status: Home / Self Care | Attending: Emergency Medicine | Admitting: Emergency Medicine

## 2023-07-14 ENCOUNTER — Other Ambulatory Visit: Payer: Self-pay

## 2023-07-14 DIAGNOSIS — J181 Lobar pneumonia, unspecified organism: Secondary | ICD-10-CM | POA: Insufficient documentation

## 2023-07-14 DIAGNOSIS — Z79899 Other long term (current) drug therapy: Secondary | ICD-10-CM | POA: Insufficient documentation

## 2023-07-14 DIAGNOSIS — J45909 Unspecified asthma, uncomplicated: Secondary | ICD-10-CM | POA: Diagnosis not present

## 2023-07-14 DIAGNOSIS — J189 Pneumonia, unspecified organism: Secondary | ICD-10-CM

## 2023-07-14 DIAGNOSIS — R059 Cough, unspecified: Secondary | ICD-10-CM | POA: Diagnosis not present

## 2023-07-14 DIAGNOSIS — E039 Hypothyroidism, unspecified: Secondary | ICD-10-CM | POA: Insufficient documentation

## 2023-07-14 DIAGNOSIS — R918 Other nonspecific abnormal finding of lung field: Secondary | ICD-10-CM | POA: Diagnosis not present

## 2023-07-14 DIAGNOSIS — R0989 Other specified symptoms and signs involving the circulatory and respiratory systems: Secondary | ICD-10-CM | POA: Diagnosis not present

## 2023-07-14 LAB — RESP PANEL BY RT-PCR (RSV, FLU A&B, COVID)  RVPGX2
Influenza A by PCR: NEGATIVE
Influenza B by PCR: NEGATIVE
Resp Syncytial Virus by PCR: NEGATIVE
SARS Coronavirus 2 by RT PCR: NEGATIVE

## 2023-07-14 MED ORDER — AMOXICILLIN-POT CLAVULANATE 875-125 MG PO TABS: 1.0000 | ORAL_TABLET | Freq: Two times a day (BID) | ORAL | 0 refills | Status: AC

## 2023-07-14 MED ORDER — ALBUTEROL SULFATE HFA 108 (90 BASE) MCG/ACT IN AERS: 1.0000 | INHALATION_SPRAY | Freq: Four times a day (QID) | RESPIRATORY_TRACT | 0 refills | Status: AC | PRN

## 2023-07-14 MED ORDER — BENZONATATE 100 MG PO CAPS: 100.0000 mg | ORAL_CAPSULE | Freq: Three times a day (TID) | ORAL | 0 refills | Status: AC

## 2023-07-14 NOTE — ED Triage Notes (Signed)
 Pt reports chest congestion and cough since Thursday. Feels rattling of congestion in chest. Nasal drainage and ears feel stopped up. Pt reports eating papaya and then symptoms began

## 2023-07-14 NOTE — ED Provider Notes (Signed)
 [facility] Provider Note   CSN: [id number] Arrival date & time: 07/14/23  0848     History  Chief Complaint  Patient presents with   Cough    Sarah Calhoun is a 28 y.o. female.  Patient with past medical history of obesity, hypothyroidism, asthma presenting to emergency room with complaint of cough which is productive, nasal drainage, congestion.  Patient reports that this started 4 days ago.  No associated shortness of breath chest pain or fever.  Reports she was recently in [place] and reports that she thinks this started after trying some new fruit.  Denies any known sick contact.  Has tried Benadryl in the evening for symptoms.    Cough      Home Medications Prior to Admission medications   Medication Sig Start Date End Date Taking? Authorizing Provider  albuterol  (VENTOLIN  HFA) 108 (90 Base) MCG/ACT inhaler Inhale 1-2 puffs into the lungs every 6 (six) hours as needed for wheezing or shortness of breath. 02/12/22   Albertus Alt, PA-C  cetirizine (ZYRTEC ALLERGY) 10 MG tablet Take 10 mg by mouth daily as needed for allergies or rhinitis.    [provider]  guaiFENesin  (ROBITUSSIN) 100 MG/5ML liquid Take 5-10 mLs (100-200 mg total) by mouth every 4 (four) hours as needed for cough or to loosen phlegm. 02/12/22   Cockerham, Alicia M, PA-C  levothyroxine  (SYNTHROID ) 200 MCG tablet Take 200 mcg by mouth daily before breakfast.    [provider]  naproxen  (NAPROSYN ) 500 MG tablet Take 1 tablet (500 mg total) by mouth 2 (two) times daily. 07/12/21   Albertus Alt, PA-C  oseltamivir  (TAMIFLU ) 75 MG capsule Take 1 capsule (75 mg total) by mouth every 12 (twelve) hours. 02/05/22   Small, Brooke L, PA      Allergies    Patient has no known allergies.    Review of Systems   Review of Systems  Respiratory:  Positive for cough.     Physical Exam Updated Vital Signs BP 130/80   Pulse 77   Temp 98.4 F (36.9  C)   Resp 18   Wt 133.8 kg   LMP  (LMP Unknown)   SpO2 96%   BMI 49.09 kg/m  Physical Exam Vitals and nursing note reviewed.  Constitutional:      General: She is not in acute distress.    Appearance: She is not toxic-appearing.  HENT:     Head: Normocephalic and atraumatic.  Eyes:     General: No scleral icterus.    Conjunctiva/sclera: Conjunctivae normal.  Cardiovascular:     Rate and Rhythm: Normal rate and regular rhythm.     Pulses: Normal pulses.     Heart sounds: Normal heart sounds.  Pulmonary:     Effort: Pulmonary effort is normal. No respiratory distress.     Breath sounds: Normal breath sounds.     Comments: No advantageous breath sounds. Abdominal:     General: Abdomen is flat. Bowel sounds are normal.     Palpations: Abdomen is soft.     Tenderness: There is no abdominal tenderness.  Musculoskeletal:     Right lower leg: No edema.     Left lower leg: No edema.     Comments: Unilateral calf swelling or tenderness.  Skin:    General: Skin is warm and dry.     Findings: No lesion.  Neurological:     General: No focal deficit present.  Mental Status: She is alert and oriented to person, place, and time. Mental status is at baseline.     ED Results / Procedures / Treatments   Labs (all labs ordered are listed, but only abnormal results are displayed) Labs Reviewed  RESP PANEL BY RT-PCR (RSV, FLU A&B, COVID)  [id number]    EKG None  Radiology DG Chest 2 View Result Date: 07/14/2023 CLINICAL DATA:  Several day history of congestion and cough EXAM: CHEST - 2 VIEW COMPARISON:  Chest radiograph dated 02/12/2022 FINDINGS: Normal lung volumes. Persistent bilateral lower lung patchy opacities, slightly increased in conspicuity on the right. No pleural effusion or pneumothorax. The heart size and mediastinal contours are within normal limits. No acute osseous abnormality. IMPRESSION: Persistent bilateral lower lung patchy opacities, slightly increased in  conspicuity on the right, which may represent atelectasis/scarring or superimposed pneumonia. Electronically Signed   By: Limin  Xu M.D.   On: 07/14/2023 10:06    Procedures Procedures    Medications Ordered in ED Medications - No data to display  ED Course/ Medical Decision Making/ A&P                                 Medical Decision Making Amount and/or Complexity of Data Reviewed Radiology: ordered.  Risk Prescription drug management.   Sarah Calhoun 28 y.o. presented today for URI like symptoms. Working DDx that I considered at this time includes, but not limited to, viral illness, pharyngitis, mono, sinusitis, electrolyte abnormality, AOM.  R/o DDx: these additional diagnoses are not consistent with patient's history, presentation, physical exam, labs/imaging findings.  Labs:  Respiratory Panel: negative   Imaging Chest x-ray: Persistent bilateral lower lung opacities with increase in right lower lung since prior x-ray.   Problem List / ED Course / Critical interventions / Medication management  Patient presenting to emergency room with complaint of URI-like symptoms.  She is hemodynamically stable and well-appearing.  She is not hypoxic or tachycardic.  Has not had fever.  She has no history of COPD or asthma.  She denies any associated chest pain shortness of breath.  On my exam her lungs are clear to auscultation.  She does have nasal drainage and congestion.  I suspect this is secondary to viral illness.  Will screen with respiratory panel.  Will chest checks x-ray due to productive cough to rule out pneumonia.  Given increased right lower lung opacity on x-ray and symptoms will treat as pneumonia with antibiotic.  Will send home with albuterol  inhaler for shortness of breath and wheezing.  Tessalon  Perles sent for symptom control.  Overall reassuring vitals and well-appearing thus do not feel she needs admission for this.  She was given strict return precautions. Patients  vitals assessed. Upon arrival patient is  hemodynamically stable.  I have reviewed the patients home medicines and have made adjustments as needed   Plan: F/u w/ PCP in 2-3d to ensure resolution of sx.  Patient was given return precautions. Patient stable for discharge at this time.  Patient educated on sx and dx and verbalized understanding of plan. Return to ER if new or worsening sx.          Final Clinical Impression(s) / ED Diagnoses Final diagnoses:  Community acquired pneumonia of right lower lobe of lung    Rx / DC Orders ED Discharge Orders     None         Chuck Caban, Kandace Organ,  PA-C 07/14/23 1040    Mozell Arias, MD 07/14/23 1419

## 2023-07-14 NOTE — ED Notes (Signed)
 Reviewed discharge instructions, follow up and medications with pt. Pt aware of medications to pick up at pharmacy. Ambulatory at discharge

## 2023-07-14 NOTE — Discharge Instructions (Signed)
 Please call primary care and follow up for recheck of symptoms, return to ER with chest pain, shortness of breath or worsening symptoms.  I will start you on an antibiotic.  Please take as prescribed.  I have also sent Tessalon  Perles which you can take as needed for cough.  I have sent albuterol  inhaler which you can use as needed for shortness of breath or wheezing.

## 2023-07-18 DIAGNOSIS — J189 Pneumonia, unspecified organism: Secondary | ICD-10-CM | POA: Diagnosis not present

## 2023-07-24 DIAGNOSIS — N939 Abnormal uterine and vaginal bleeding, unspecified: Secondary | ICD-10-CM | POA: Diagnosis not present
